# Patient Record
Sex: Male | Born: 1950 | Race: Black or African American | Hispanic: No | Marital: Married | Smoking: Current every day smoker
Health system: Southern US, Community
[De-identification: ages and names within clinical notes are randomized; demographics above are authoritative.]

## PROBLEM LIST (undated history)

## (undated) DIAGNOSIS — M259 Joint disorder, unspecified: Secondary | ICD-10-CM

## (undated) DIAGNOSIS — I10 Essential (primary) hypertension: Secondary | ICD-10-CM

## (undated) HISTORY — DX: Essential (primary) hypertension: I10

## (undated) HISTORY — PX: HERNIA REPAIR: SHX51

---

## 2009-10-07 ENCOUNTER — Emergency Department (HOSPITAL_COMMUNITY): Admission: EM | Admit: 2009-10-07 | Discharge: 2009-10-07 | Payer: Self-pay | Admitting: Emergency Medicine

## 2009-10-07 IMAGING — US US RENAL
1 series · 14 of 25 positions shown · non-contrast
Comparison: None.

CLINICAL DATA: Rectal bleeding, possible hematuria

RENAL/URINARY TRACT ULTRASOUND COMPLETE

[Series 1: us renal · 0.31mm/px · 14 of 37 slices shown]
[im 1/37]
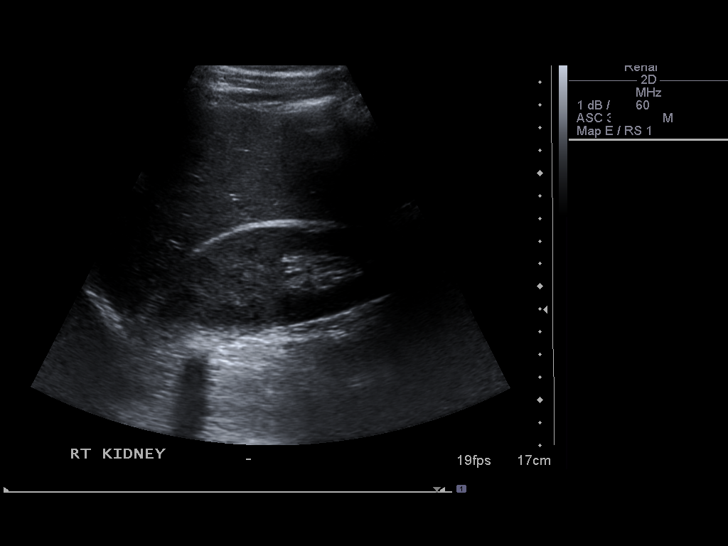
[im 4/37]
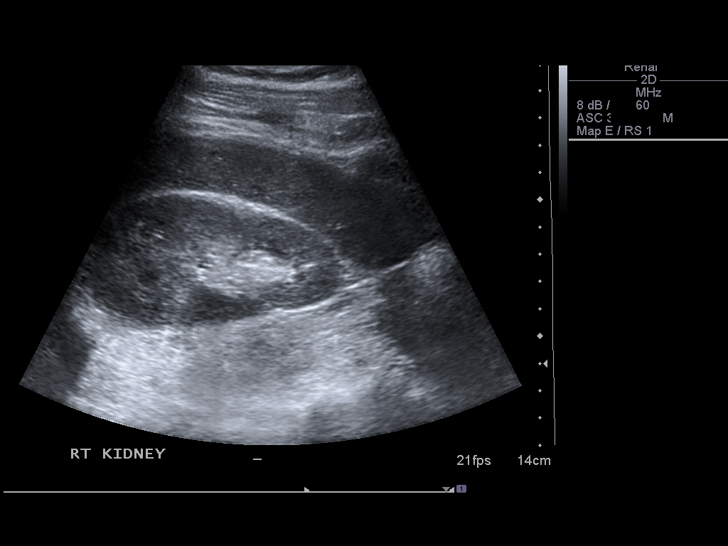
[im 7/37]
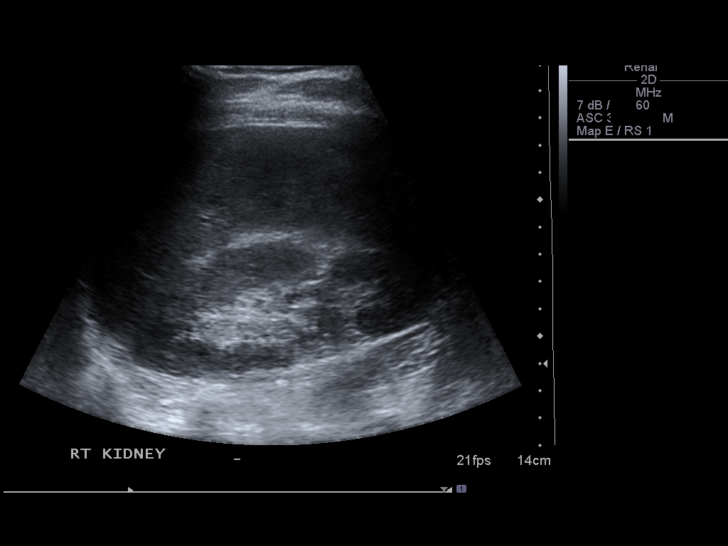
[im 10/37]
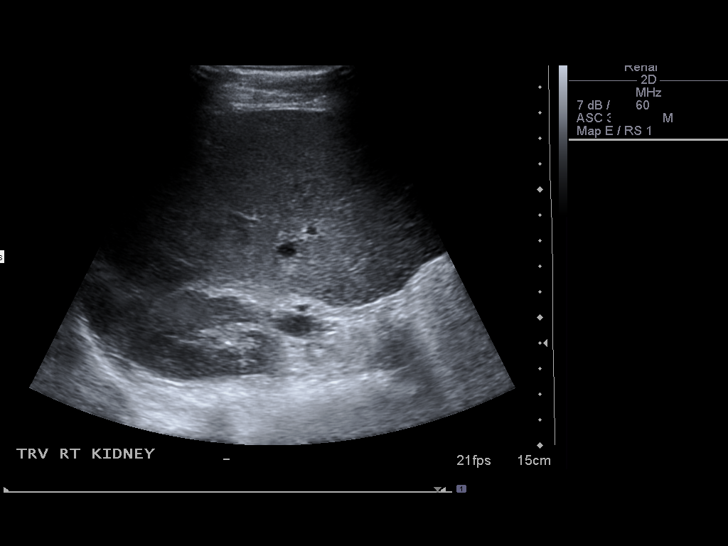
[im 13/37]
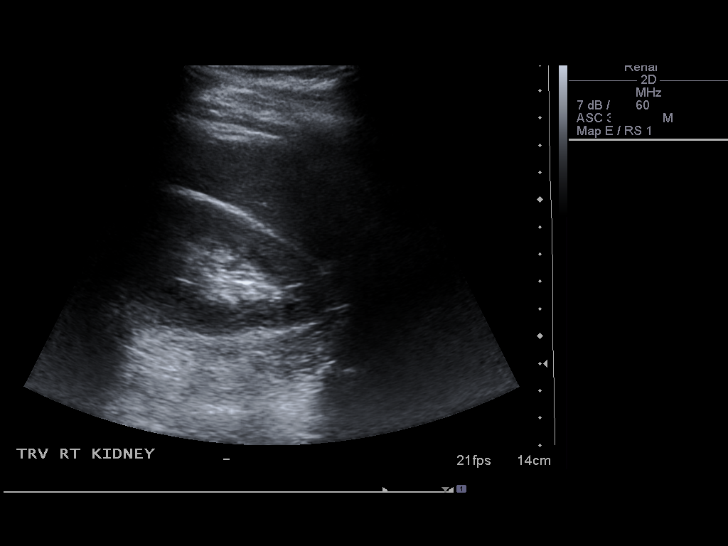
[im 14/37]
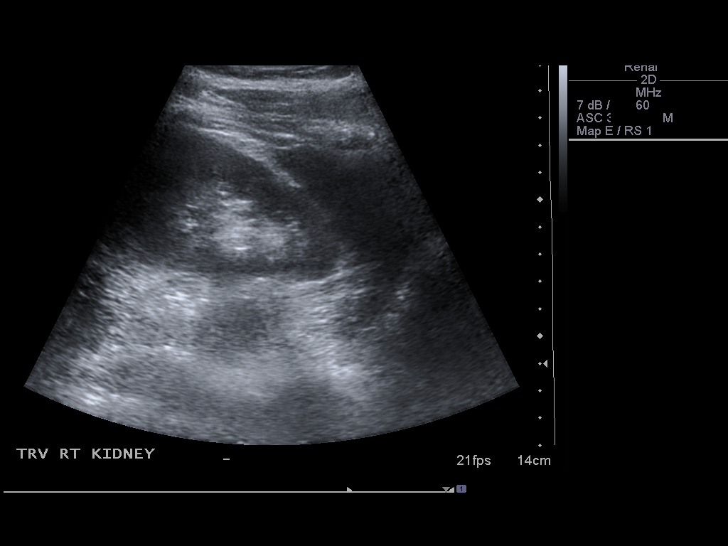
[im 17/37]
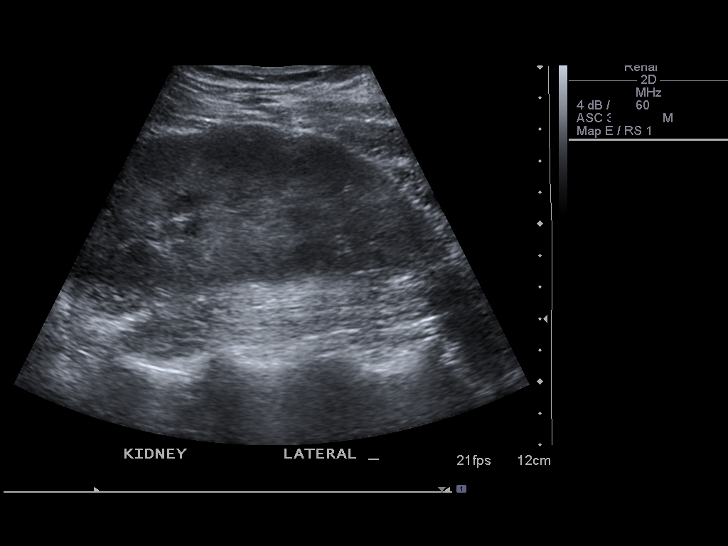
[im 20/37]
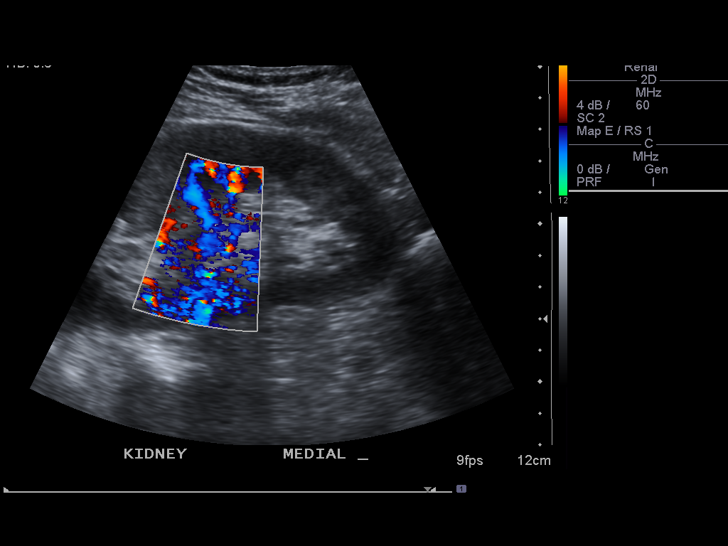
[im 23/37]
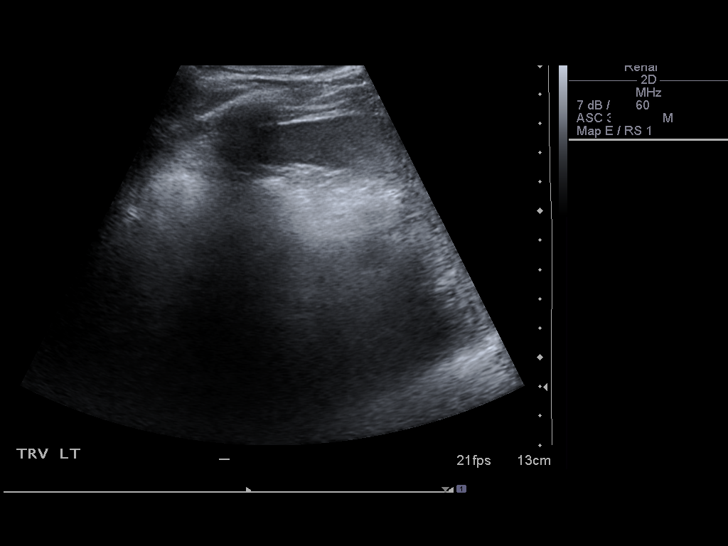
[im 25/37]
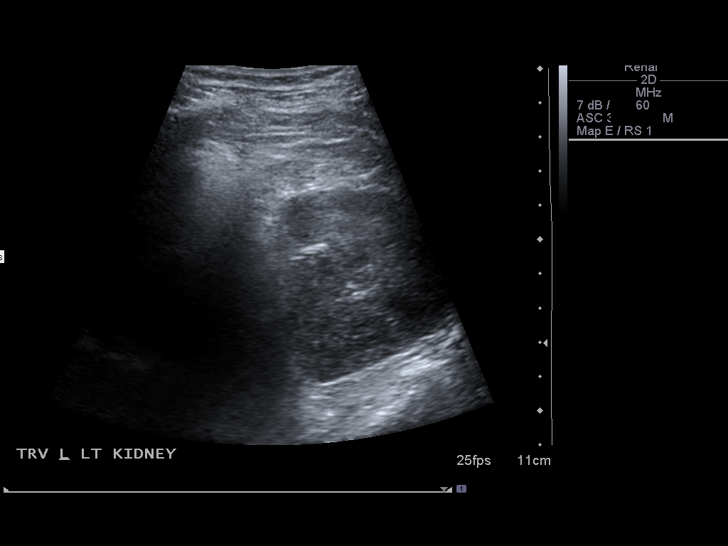
[im 28/37]
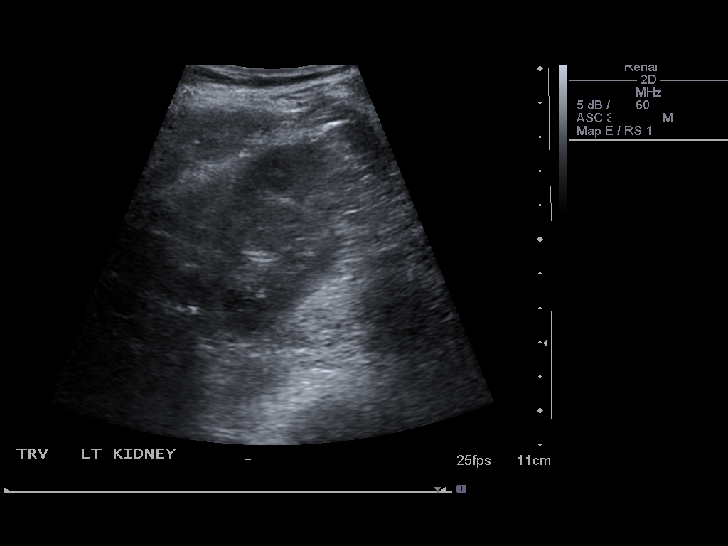
[im 31/37]
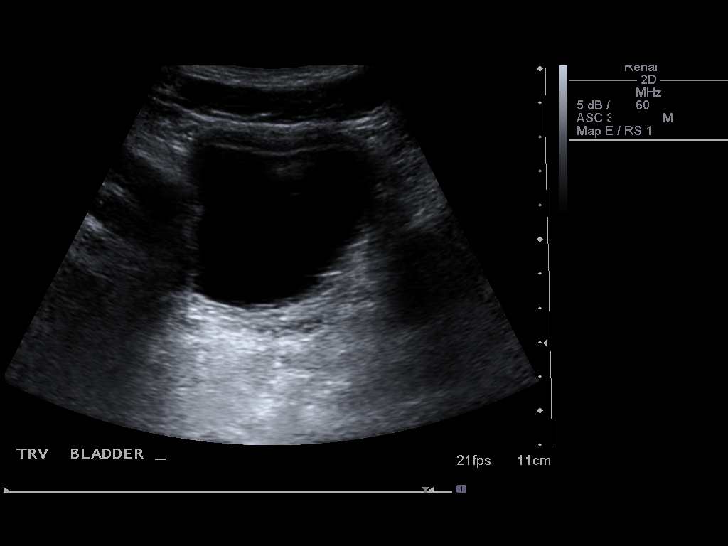
[im 34/37]
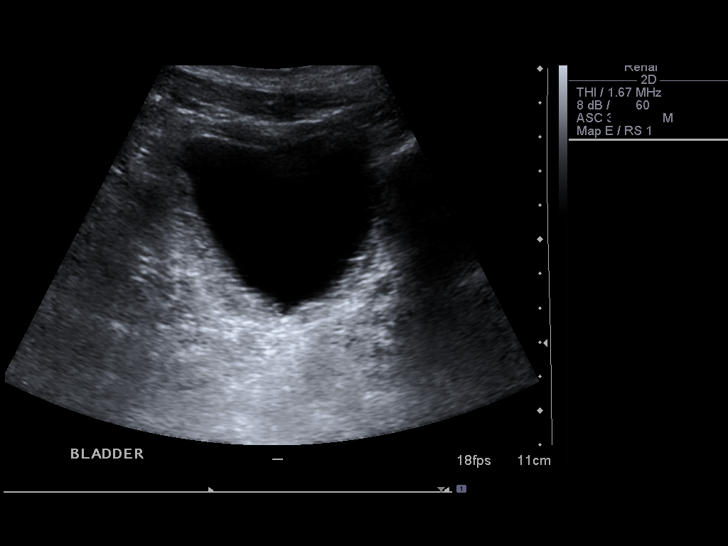
[im 37/37]
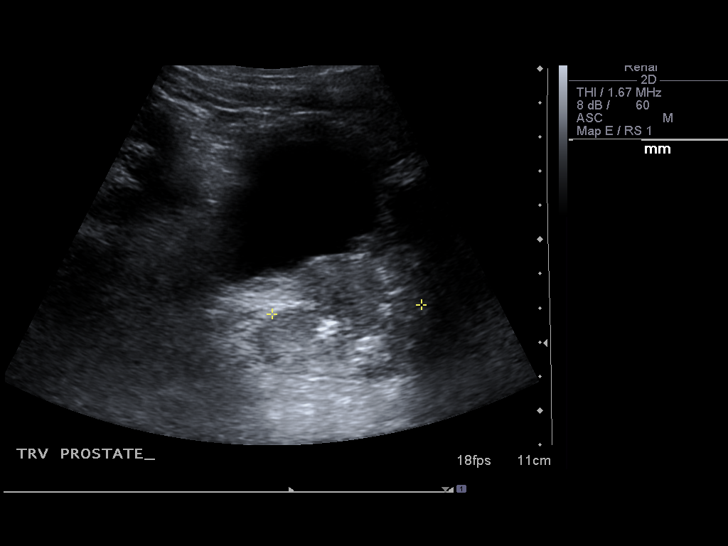

[14 of 25 positions shown; findings below may reference images not displayed]

FINDINGS: Right Kidney:  No hydronephrosis is seen.  The right kidney
measures 11.8 cm sagittally.

Left Kidney:  No hydronephrosis.  The left kidney measures 11.9 cm.

Bladder:  The urinary bladder is not optimally distended.  There is
some debris layering in the bladder.  The prostate is normal in
size.
IMPRESSION: No hydronephrosis.  The bladder is not well distended but is
unremarkable.

## 2009-11-23 ENCOUNTER — Emergency Department (HOSPITAL_COMMUNITY): Admission: EM | Admit: 2009-11-23 | Discharge: 2009-11-23 | Payer: Self-pay | Admitting: Family Medicine

## 2010-03-21 ENCOUNTER — Emergency Department (HOSPITAL_COMMUNITY)
Admission: EM | Admit: 2010-03-21 | Discharge: 2010-03-21 | Payer: Self-pay | Source: Home / Self Care | Admitting: Family Medicine

## 2010-03-21 LAB — POCT URINALYSIS DIPSTICK
Ketones, ur: NEGATIVE mg/dL
Specific Gravity, Urine: 1.015 (ref 1.005–1.030)

## 2010-03-22 LAB — GC/CHLAMYDIA PROBE AMP, GENITAL: GC Probe Amp, Genital: NEGATIVE

## 2010-05-09 LAB — POCT URINALYSIS DIPSTICK
Protein, ur: NEGATIVE mg/dL
Specific Gravity, Urine: 1.02 (ref 1.005–1.030)
Urobilinogen, UA: 0.2 mg/dL (ref 0.0–1.0)
pH: 5.5 (ref 5.0–8.0)

## 2010-05-10 LAB — URINALYSIS, ROUTINE W REFLEX MICROSCOPIC
Bilirubin Urine: NEGATIVE
Glucose, UA: NEGATIVE mg/dL
Ketones, ur: NEGATIVE mg/dL
Leukocytes, UA: NEGATIVE
Nitrite: NEGATIVE
Protein, ur: NEGATIVE mg/dL
Specific Gravity, Urine: 1.01 (ref 1.005–1.030)
Urobilinogen, UA: 0.2 mg/dL (ref 0.0–1.0)
pH: 6 (ref 5.0–8.0)

## 2010-05-10 LAB — BASIC METABOLIC PANEL WITH GFR
CO2: 26 meq/L (ref 19–32)
Calcium: 9 mg/dL (ref 8.4–10.5)
Creatinine, Ser: 0.79 mg/dL (ref 0.4–1.5)
GFR calc non Af Amer: >60 mL/min (ref >60)
Glucose, Bld: 103 mg/dL — ABNORMAL HIGH (ref 70–99)
Sodium: 135 meq/L (ref 135–145)

## 2010-05-10 LAB — CBC
HCT: 44.3 % (ref 39.0–52.0)
Hemoglobin: 15.6 g/dL (ref 13.0–17.0)
MCH: 31.6 pg (ref 26.0–34.0)
MCHC: 35.2 g/dL (ref 30.0–36.0)
MCV: 89.9 fL (ref 78.0–100.0)
Platelets: 232 K/uL (ref 150–400)
RBC: 4.93 MIL/uL (ref 4.22–5.81)
RDW: 13.7 % (ref 11.5–15.5)
WBC: 5.3 10*3/uL (ref 4.0–10.5)

## 2010-05-10 LAB — HEMOCCULT GUIAC POC 1CARD (OFFICE): Fecal Occult Bld: NEGATIVE

## 2010-05-10 LAB — BASIC METABOLIC PANEL
BUN: 14 mg/dL (ref 6–23)
Chloride: 102 mEq/L (ref 96–112)
GFR calc Af Amer: >60 mL/min (ref >60)
Potassium: 4.3 mEq/L (ref 3.5–5.1)

## 2010-05-10 LAB — URINE MICROSCOPIC-ADD ON

## 2010-06-03 ENCOUNTER — Emergency Department (HOSPITAL_COMMUNITY): Payer: Self-pay

## 2010-06-03 ENCOUNTER — Emergency Department (HOSPITAL_COMMUNITY)
Admission: EM | Admit: 2010-06-03 | Discharge: 2010-06-03 | Disposition: A | Discharge status: Home / Self Care | Payer: Self-pay | Attending: Emergency Medicine | Admitting: Emergency Medicine

## 2010-06-03 DIAGNOSIS — S139XXA Sprain of joints and ligaments of unspecified parts of neck, initial encounter: Secondary | ICD-10-CM | POA: Insufficient documentation

## 2010-06-03 DIAGNOSIS — M47812 Spondylosis without myelopathy or radiculopathy, cervical region: Secondary | ICD-10-CM | POA: Insufficient documentation

## 2010-06-03 DIAGNOSIS — S43109A Unspecified dislocation of unspecified acromioclavicular joint, initial encounter: Secondary | ICD-10-CM | POA: Insufficient documentation

## 2010-06-03 DIAGNOSIS — R51 Headache: Secondary | ICD-10-CM | POA: Insufficient documentation

## 2010-06-03 DIAGNOSIS — Y92009 Unspecified place in unspecified non-institutional (private) residence as the place of occurrence of the external cause: Secondary | ICD-10-CM | POA: Insufficient documentation

## 2010-06-03 IMAGING — CR DG SHOULDER 2+V*L*
3 series · 3 of 3 positions shown · non-contrast
Comparison: None.

CLINICAL DATA: Fall

LEFT SHOULDER - 2+ VIEW

[w shoulder ap internal left]
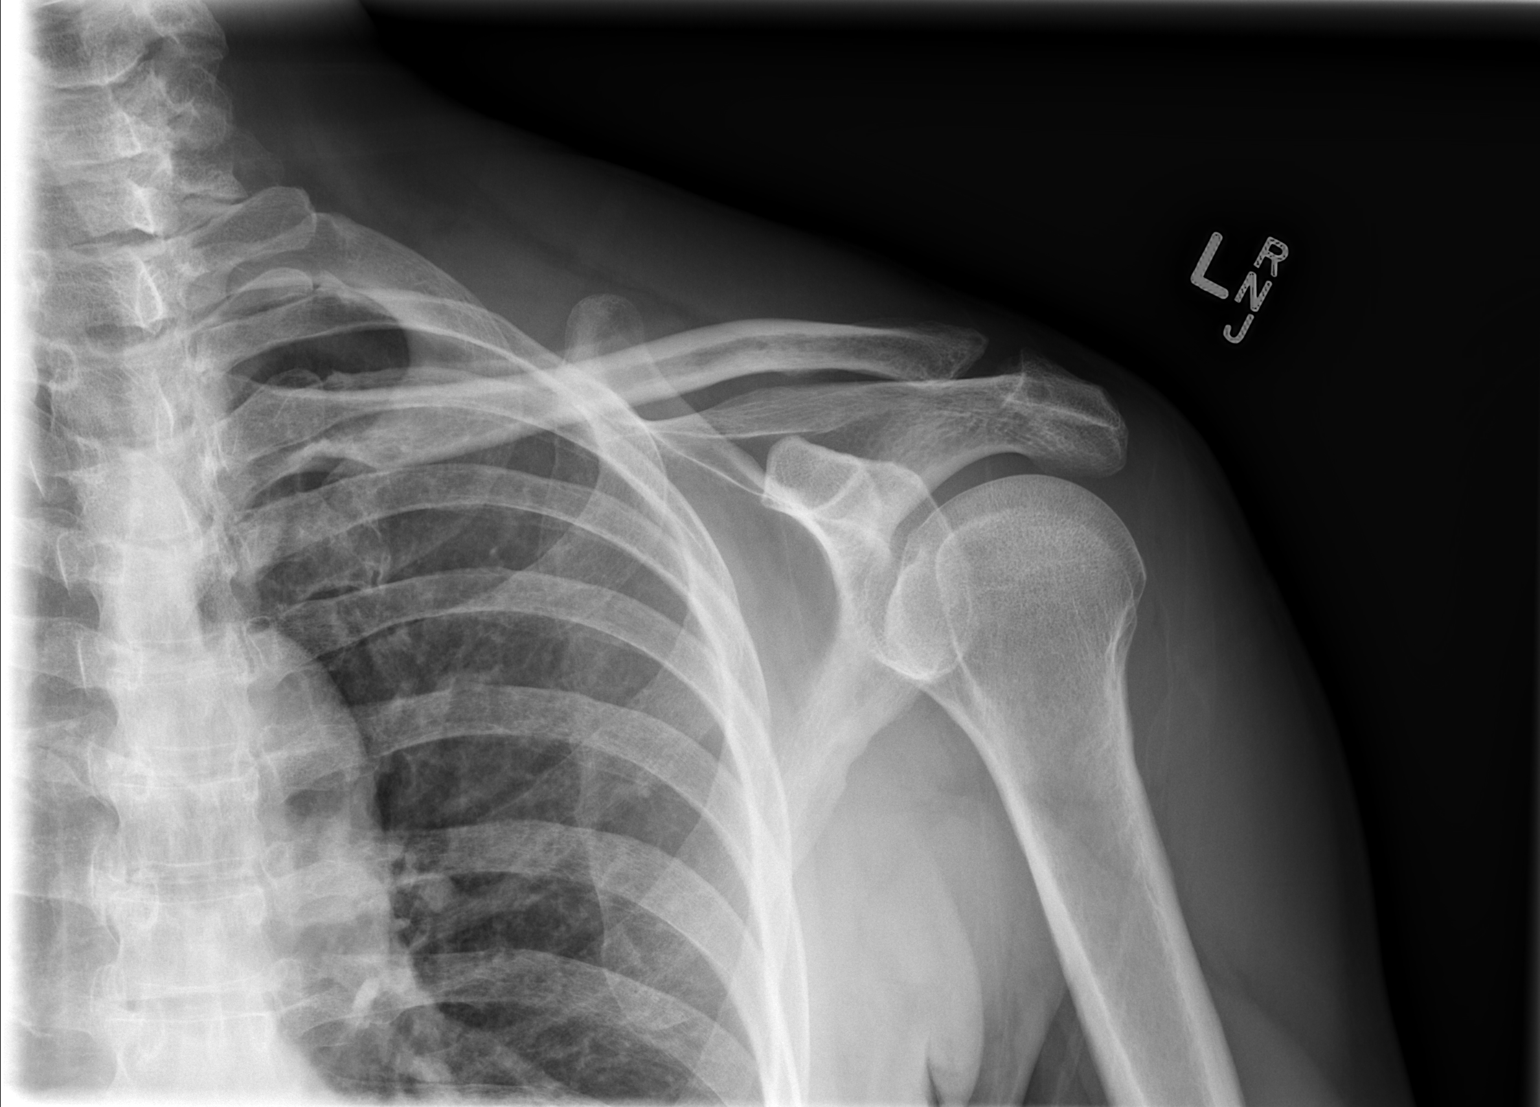

[w shoulder ap external left]
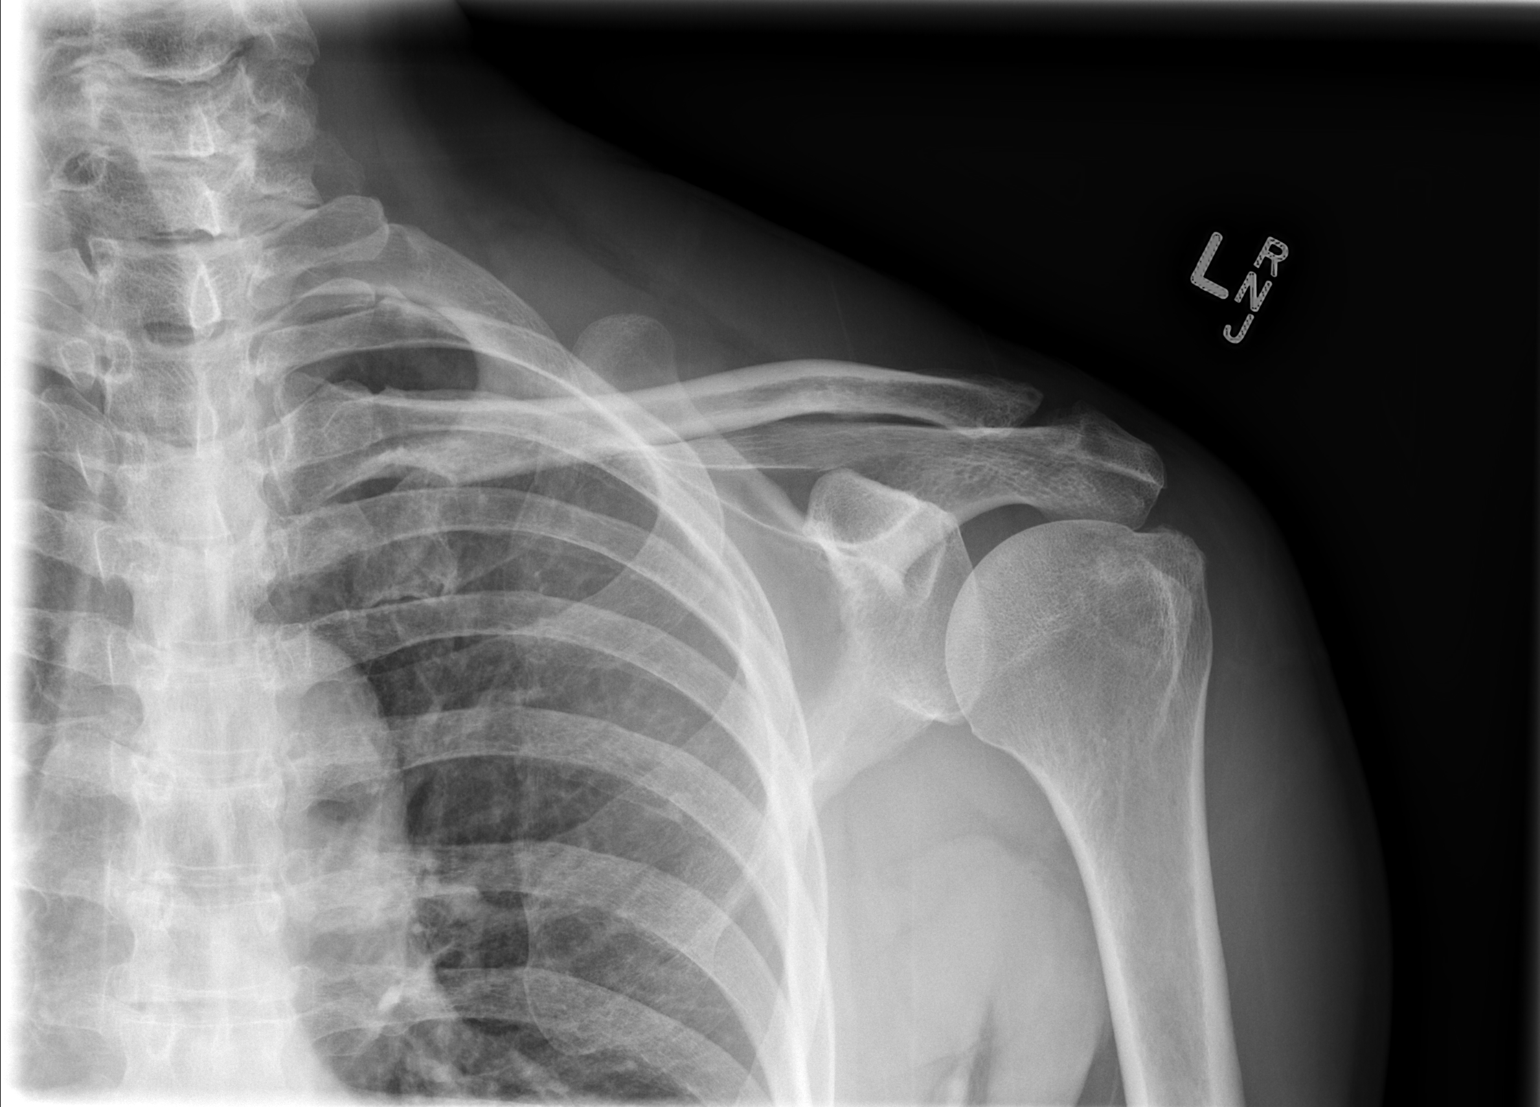

[w shoulder y view left]
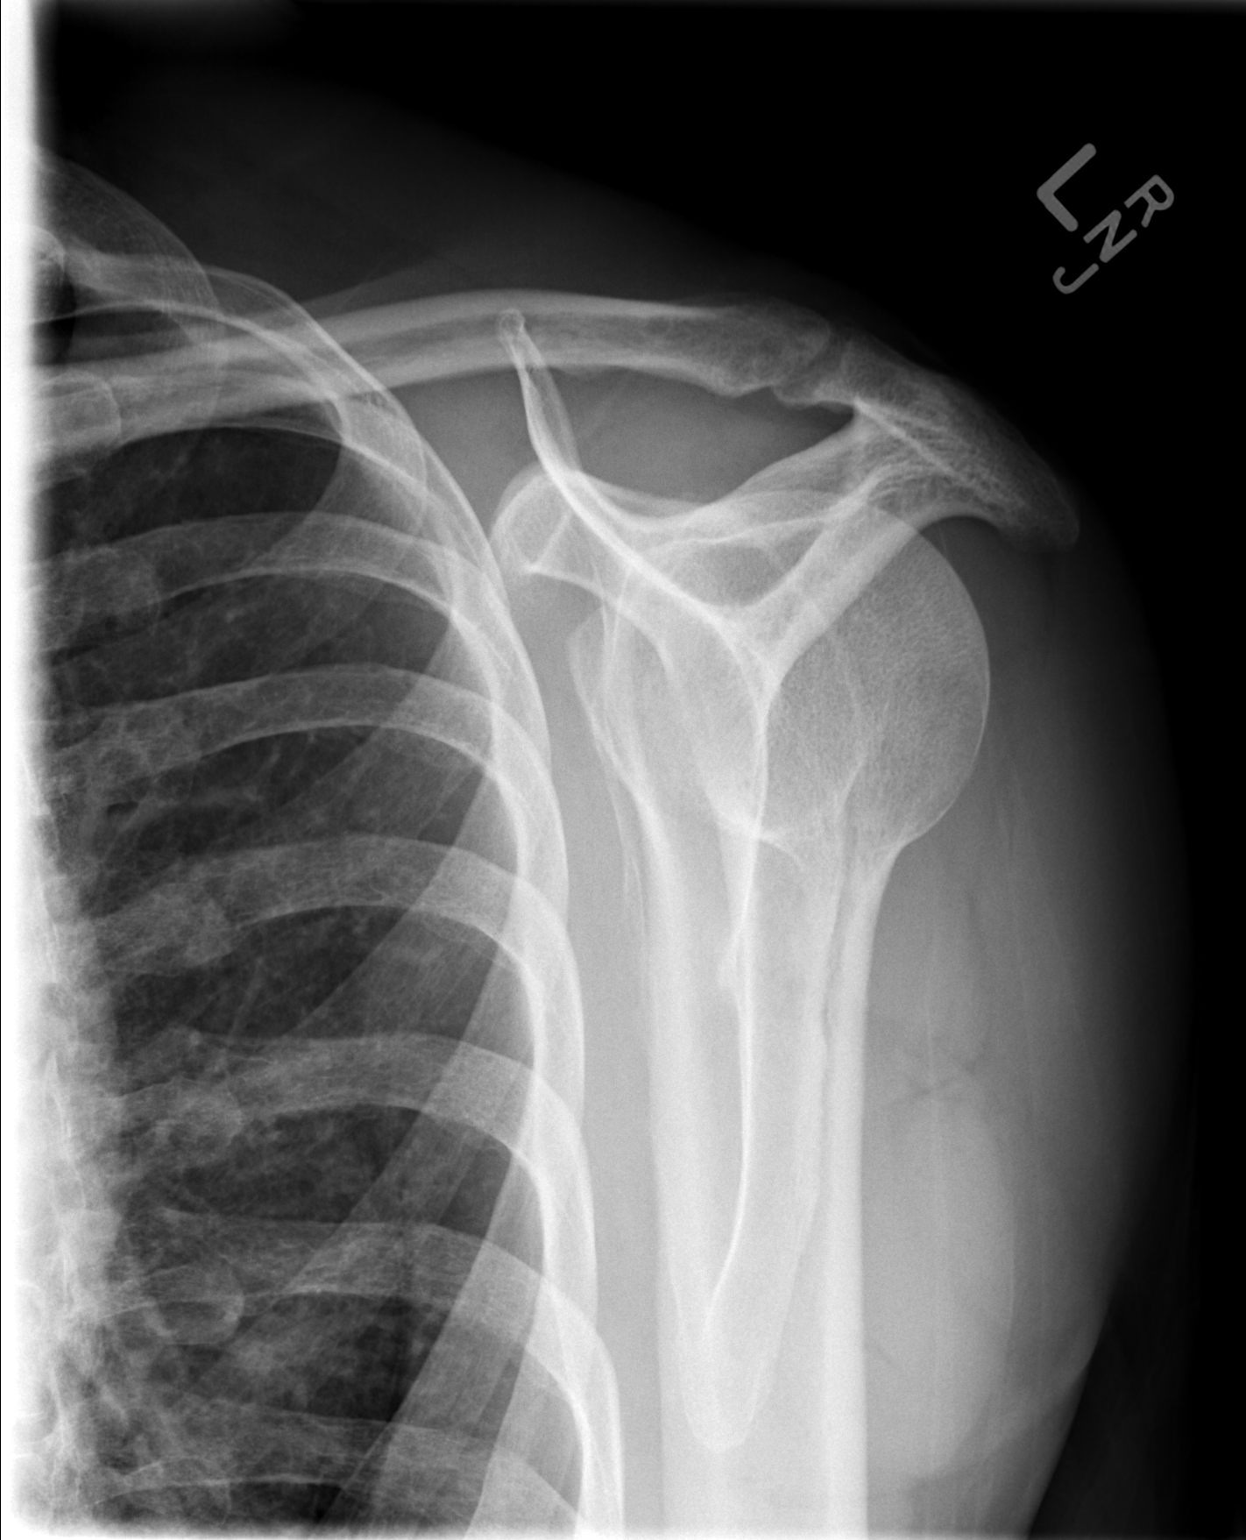

[3 of 3 positions shown; findings below may reference images not displayed]

FINDINGS: The AC joint is widened.  Core toe clavicular interval is
within normal limits.  No fracture.  No evidence of glenohumeral
dislocation.
IMPRESSION: AC joint widening suggesting AC joint tear.  Correlate with
location of point tenderness.
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
## 2010-08-18 ENCOUNTER — Emergency Department (HOSPITAL_COMMUNITY)
Admission: EM | Admit: 2010-08-18 | Discharge: 2010-08-18 | Disposition: A | Discharge status: Home / Self Care | Payer: Self-pay | Attending: Emergency Medicine | Admitting: Emergency Medicine

## 2010-08-18 DIAGNOSIS — M542 Cervicalgia: Secondary | ICD-10-CM | POA: Insufficient documentation

## 2010-09-20 ENCOUNTER — Emergency Department (HOSPITAL_COMMUNITY)
Admission: EM | Admit: 2010-09-20 | Discharge: 2010-09-20 | Disposition: A | Discharge status: Home / Self Care | Payer: Self-pay | Attending: Emergency Medicine | Admitting: Emergency Medicine

## 2010-09-20 DIAGNOSIS — R1310 Dysphagia, unspecified: Secondary | ICD-10-CM | POA: Insufficient documentation

## 2010-09-20 DIAGNOSIS — R6889 Other general symptoms and signs: Secondary | ICD-10-CM | POA: Insufficient documentation

## 2010-12-05 ENCOUNTER — Emergency Department (HOSPITAL_COMMUNITY)
Admission: EM | Admit: 2010-12-05 | Discharge: 2010-12-05 | Disposition: A | Discharge status: Home / Self Care | Payer: Self-pay | Attending: Emergency Medicine | Admitting: Emergency Medicine

## 2010-12-05 DIAGNOSIS — X58XXXA Exposure to other specified factors, initial encounter: Secondary | ICD-10-CM | POA: Insufficient documentation

## 2010-12-05 DIAGNOSIS — M545 Low back pain, unspecified: Secondary | ICD-10-CM | POA: Insufficient documentation

## 2010-12-05 DIAGNOSIS — S025XXA Fracture of tooth (traumatic), initial encounter for closed fracture: Secondary | ICD-10-CM | POA: Insufficient documentation

## 2011-05-01 ENCOUNTER — Encounter (HOSPITAL_COMMUNITY): Payer: Self-pay | Admitting: *Deleted

## 2011-05-01 ENCOUNTER — Emergency Department (HOSPITAL_COMMUNITY): Payer: Self-pay

## 2011-05-01 ENCOUNTER — Emergency Department (HOSPITAL_COMMUNITY)
Admission: EM | Admit: 2011-05-01 | Discharge: 2011-05-01 | Disposition: A | Discharge status: Home / Self Care | Payer: Self-pay | Attending: Emergency Medicine | Admitting: Emergency Medicine

## 2011-05-01 DIAGNOSIS — S61419A Laceration without foreign body of unspecified hand, initial encounter: Secondary | ICD-10-CM

## 2011-05-01 DIAGNOSIS — W298XXA Contact with other powered powered hand tools and household machinery, initial encounter: Secondary | ICD-10-CM | POA: Insufficient documentation

## 2011-05-01 DIAGNOSIS — S61409A Unspecified open wound of unspecified hand, initial encounter: Secondary | ICD-10-CM | POA: Insufficient documentation

## 2011-05-01 DIAGNOSIS — Y9269 Other specified industrial and construction area as the place of occurrence of the external cause: Secondary | ICD-10-CM | POA: Insufficient documentation

## 2011-05-01 DIAGNOSIS — F17200 Nicotine dependence, unspecified, uncomplicated: Secondary | ICD-10-CM | POA: Insufficient documentation

## 2011-05-01 IMAGING — CR DG HAND COMPLETE 3+V*L*
3 series · 3 of 3 positions shown · non-contrast
Comparison: None.

CLINICAL DATA: Second metacarpal pain following injury at work.

LEFT HAND - COMPLETE 3+ VIEW

[x hand pa left]
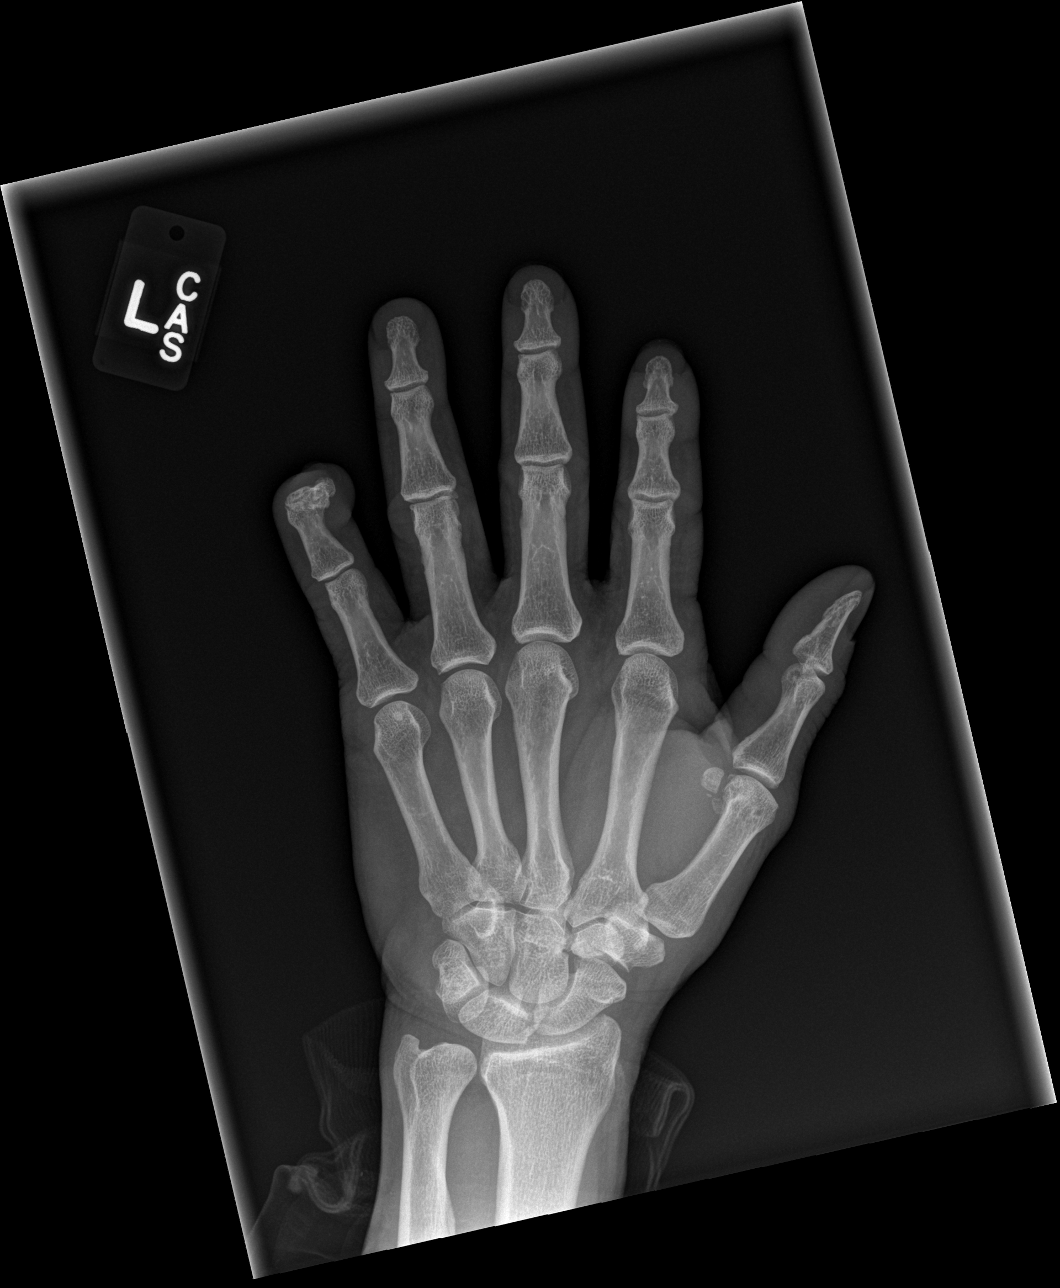

[x hand obl left]
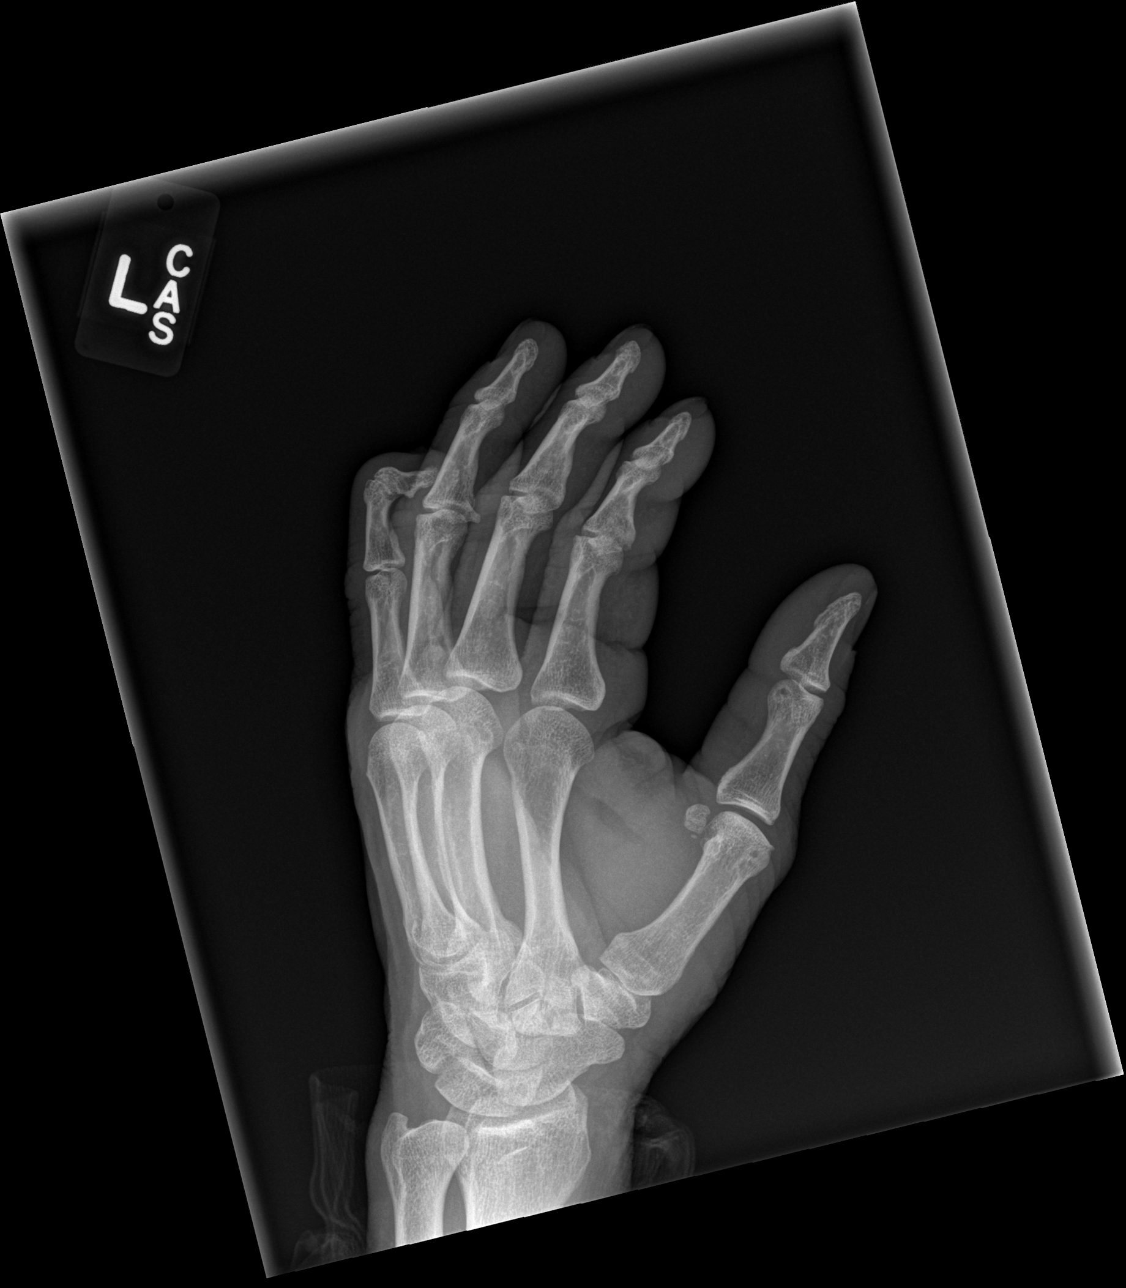

[x hand lat left]
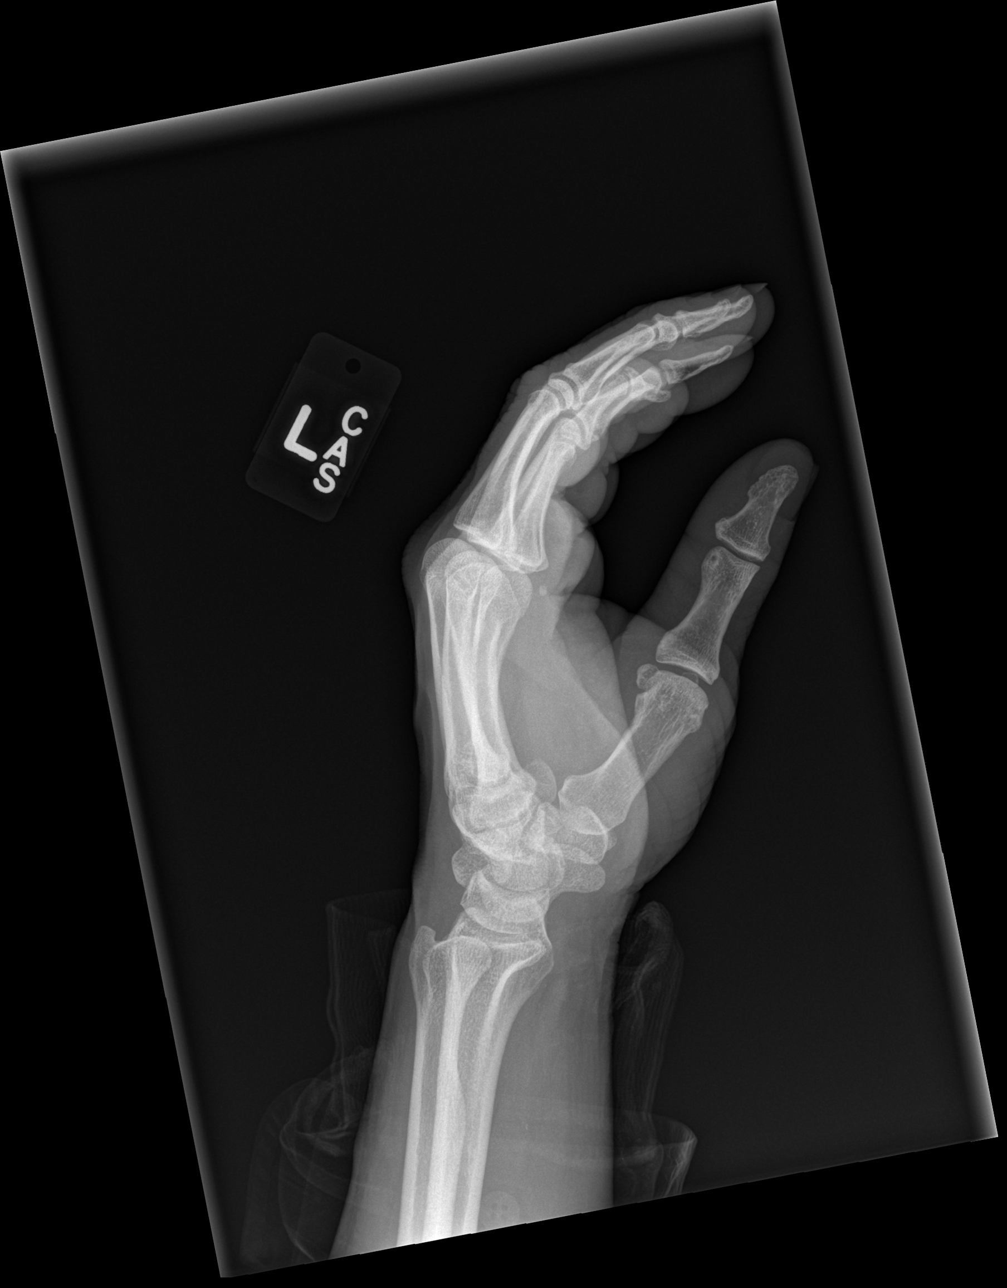

[3 of 3 positions shown; findings below may reference images not displayed]

FINDINGS: There is no evidence of acute fracture or dislocation.
No foreign bodies are identified.  There is an apparent flexion
contracture at the distal interphalangeal joint of the small
finger, presumably chronic.  The alignment is otherwise normal.
IMPRESSION: No acute osseous findings are seen in the area of concern.
Presumed chronic flexion contracture of the distal interphalangeal
joint of the small finger.

## 2011-05-01 MED ORDER — HYDROCODONE-ACETAMINOPHEN 5-325 MG PO TABS: 1.0000 | ORAL_TABLET | ORAL | Status: AC | PRN

## 2011-05-01 NOTE — ED Provider Notes (Signed)
History     CSN: [id number]  Arrival date & time 05/01/11  9147   First MD Initiated Contact with Patient 05/01/11 2151      Chief Complaint  Patient presents with  . Hand Pain    (Consider location/radiation/quality/duration/timing/severity/associated sxs/prior treatment) Patient is a 61 y.o. male presenting with hand injury. The history is provided by the patient.  Hand Injury  The incident occurred 3 to 5 hours ago. The incident occurred at work. Injury mechanism: laceration. The pain is present in the left hand. The quality of the pain is described as sharp. The pain is moderate. The pain has been improving since the incident. He reports no foreign bodies present. The symptoms are aggravated by use and palpation. He has tried nothing for the symptoms.   Pt states he was at work around 6p today and using an angle grinder when he accidentally cut himself with the blade. He suffered a laceration to the dorsum of his L hand. States the blade was new and he is up to date on tetanus (within past 5 yrs). Denies any numbness, weakness, tingling in the hand; he has not noted any decrease in ROM at wrist or in hand.  History reviewed. No pertinent past medical history.  Past Surgical History  Procedure Date  . Hernia repair     No family history on file.  History  Substance Use Topics  . Smoking status: Current Everyday Smoker  . Smokeless tobacco: Not on file  . Alcohol Use: Yes     quart a day of beer      Review of Systems  Constitutional: Negative.   Gastrointestinal: Negative for nausea and vomiting.  Musculoskeletal: Negative for myalgias, joint swelling and arthralgias.  Skin: Positive for wound.  Neurological: Negative for dizziness, weakness and numbness.    Allergies  Aspirin and Ibuprofen  Home Medications  No current outpatient prescriptions on file.  BP 141/86  Pulse 69  Temp(Src) 98 F (36.7 C) (Oral)  Resp 18  SpO2 95%  Physical Exam  Nursing note  and vitals reviewed. Constitutional: He appears well-developed and well-nourished. No distress.  HENT:  Head: Normocephalic and atraumatic.  Neck: Normal range of motion.  Cardiovascular: Normal rate.   Pulmonary/Chest: Effort normal.  Musculoskeletal:       Left wrist: He exhibits normal range of motion.       Left hand: He exhibits laceration. He exhibits normal range of motion, no bony tenderness, normal capillary refill and no swelling. normal sensation noted. Decreased sensation is not present in the ulnar distribution, is not present in the medial redistribution and is not present in the radial distribution. Normal strength noted. He exhibits no finger abduction, no thumb/finger opposition and no wrist extension trouble.       Hands:      ~1.5-2cm laceration to dorsum as diagrammed. Laceration appears superficial; no tendons or other structures visualized. Wound clean. Hand neurovascularly intact with sensory intact to lt touch in radial, median, ulnar dist. Good radial pulse. Cap refill <3.  Neurological: He is alert.  Skin: He is not diaphoretic.    ED Course  Procedures (including critical care time)  LACERATION REPAIR Performed by: Grant Fontana Authorized by: Grant Fontana Consent: Verbal consent obtained. Risks and benefits: risks, benefits and alternatives were discussed Consent given by: patient Patient identity confirmed: provided demographic data Prepped and Draped in normal sterile fashion Wound explored  Laceration Location: L hand dorsum  Laceration Length: 2cm  No Foreign Bodies  seen or palpated  Anesthesia: local infiltration  Local anesthetic: lidocaine 1% without epinephrine  Anesthetic total: 3 ml  Irrigation method: syringe Amount of cleaning: standard  Skin closure: 5-0 Prolene  Number of sutures: 7  Technique: simple interrupted  Patient tolerance: Patient tolerated the procedure well with no immediate complications.   Labs  Reviewed - No data to display Dg Hand Complete Left  05/01/2011  *RADIOLOGY REPORT*  Clinical Data: Second metacarpal pain following injury at work.  LEFT HAND - COMPLETE 3+ VIEW  Comparison: None.  Findings: There is no evidence of acute fracture or dislocation. No foreign bodies are identified.  There is an apparent flexion contracture at the distal interphalangeal joint of the small finger, presumably chronic.  The alignment is otherwise normal.  IMPRESSION: No acute osseous findings are seen in the area of concern. 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  Original Report Authenticated By: Gerrianne Scale, M.D.   I personally reviewed the plain films.  1. Laceration of hand without complication, excluding fingers       MDM  Pt with laceration to dorsum of L hand sustained on a grinder while at work. Tetanus up to date. Negative XR. FROM in all fingers, neurovasc intact. Wound appears clean without tendon involvement. It was irrigated extensively and closed with Prolene which he tolerated well. Return precautions and return for suture removal discussed.        Grant Fontana, [place] 05/02/11 [phone]

## 2011-05-01 NOTE — ED Notes (Signed)
Pt reports he caught left hand in equipment at work around [employer]. Pt reports pain to dorsal hand.

## 2011-05-01 NOTE — Discharge Instructions (Signed)
You'll need to return to urgent care in 7 days to get your stitches removed. If you're noticing increased drainage from the area, redness of the surrounding skin, fever/chills, or other worrisome symptoms, return to the ER.  RESOURCE GUIDE  Dental Problems  Patients with Medicaid: [facility] [phone] [address]                                           [phone] [address] Phone:  [phone]                                                  Phone:  [phone]  If unable to pay or uninsured, contact:  [facility] or [facility]. to become qualified for the adult dental clinic.  Chronic Pain Problems Contact [facility]  [phone] Patients need to be referred by their primary care doctor.  Insufficient Money for Medicine Contact [employer]:  call "211" or [facility] 860-087-2138.  No Primary Care Doctor Call [facility]  [phone] Other agencies that provide inexpensive medical care    [facility]  [phone]    [facility]  [phone]    [facility]  [phone]    [facility]  [phone]    [facility]  [phone]    [facility]  [phone]  [facility]  [phone] [facility] Services  [phone] [facility] Mental Health   [phone] (emergency services [phone])  Substance Abuse Resources Alcohol and Drug Services  [phone] [facility] [phone] [facility] [phone] Floydene Flock [phone] Residential & Outpatient Substance Abuse Program  [phone]  Abuse/Neglect [facility] Child Abuse Hotline 603-247-9427 [facility] Child Abuse Hotline 7127764583 (After Hours)  Emergency Shelter [facility] [phone]  Maternity Homes [facility] [phone] [facility] [phone]  MRSA  Hotline #:   934 572 6347    [facility] Resources  [facility]     [employer]                          [facility] Dept. [address] [place]                       [address]      [address]  [place]                                                Cristobal Goldmann Phone:  [phone]  Phone:  [phone]                 Phone:  [phone]  [facility] Mental Health Phone:  [phone]  [facility] Child Abuse Hotline 919-288-3736 (516)423-2730 (After Hours)  Laceration Care, Adult A laceration is a cut or lesion that goes through all layers of the skin and into the tissue just beneath the skin. TREATMENT  Some lacerations may not require closure. Some lacerations may not be able to be closed due to an increased risk of infection. It is important to see your caregiver as soon as possible after an injury to minimize the risk of infection and maximize the opportunity for successful closure. If closure is appropriate, pain medicines may be given, if needed. The wound will be cleaned to help prevent infection. Your caregiver will use stitches (sutures), staples, wound glue (adhesive), or skin adhesive strips to repair the laceration. These tools bring the skin edges together to allow for faster healing and a better cosmetic outcome. However, all wounds will heal with a scar. Once the wound has healed, scarring can be minimized by covering the wound with sunscreen during the day for 1 full year. HOME CARE INSTRUCTIONS  For sutures or staples:  Keep the wound clean and dry.   If you were given a bandage (dressing), you should change it at least once a day. Also, change the dressing if it becomes wet or dirty, or as directed by your caregiver.   Wash the wound with soap and water 2 times a day. Rinse the wound off with water to remove all soap. Pat the wound dry with a  clean towel.   After cleaning, apply a thin layer of the antibiotic ointment as recommended by your caregiver. This will help prevent infection and keep the dressing from sticking.   You may shower as usual after the first 24 hours. Do not soak the wound in water until the sutures are removed.   Only take over-the-counter or prescription medicines for pain, discomfort, or fever as directed by your caregiver.   Get your sutures or staples removed as directed by your caregiver.  For skin adhesive strips:  Keep the wound clean and dry.   Do not get the skin adhesive strips wet. You may bathe carefully, using caution to keep the wound dry.   If the wound gets wet, pat it dry with a clean towel.   Skin adhesive strips will fall off on their own. You may trim the strips as the wound heals. Do not remove skin adhesive strips that are still stuck to the wound. They will fall off in time.  For wound adhesive:  You may briefly wet your wound in the shower or bath. Do not soak or scrub the wound. Do not swim. Avoid periods of heavy perspiration until the skin adhesive has fallen off on its own. After showering or bathing, gently pat the wound dry with a clean towel.   Do not apply liquid medicine, cream medicine, or ointment medicine to your wound while the skin adhesive is in place. This may loosen the film before your wound is healed.   If a dressing is placed over the wound, be careful not to apply tape directly over the skin adhesive. This may cause the adhesive to be pulled off before the wound is healed.   Avoid prolonged exposure to sunlight or tanning lamps while the skin adhesive is in place. Exposure to ultraviolet light in the first  year will darken the scar.   The skin adhesive will usually remain in place for 5 to 10 days, then naturally fall off the skin. Do not pick at the adhesive film.  You may need a tetanus shot if:  You cannot remember when you had your last tetanus shot.    You have never had a tetanus shot.  If you get a tetanus shot, your arm may swell, get red, and feel warm to the touch. This is common and not a problem. If you need a tetanus shot and you choose not to have one, there is a rare chance of getting tetanus. Sickness from tetanus can be serious. SEEK MEDICAL CARE IF:   You have redness, swelling, or increasing pain in the wound.   You see a red line that goes away from the wound.   You have yellowish-white fluid (pus) coming from the wound.   You have a fever.   You notice a bad smell coming from the wound or dressing.   Your wound breaks open before or after sutures have been removed.   You notice something coming out of the wound such as wood or glass.   Your wound is on your hand or foot and you cannot move a finger or toe.  SEEK IMMEDIATE MEDICAL CARE IF:   Your pain is not controlled with prescribed medicine.   You have severe swelling around the wound causing pain and numbness or a change in color in your arm, hand, leg, or foot.   Your wound splits open and starts bleeding.   You have worsening numbness, weakness, or loss of function of any joint around or beyond the wound.   You develop painful lumps near the wound or on the skin anywhere on your body.  MAKE SURE YOU:   Understand these instructions.   Will watch your condition.   Will get help right away if you are not doing well or get worse.  Document Released: 02/10/2005 Document Revised: 01/30/2011 Document Reviewed: 08/06/2010 [facility] Patient Information 2012 [place], [place].

## 2011-05-01 NOTE — ED Notes (Signed)
Pt reports working with equipment at his job. Pt has laceration to left hand. Bleeding controlled. Pt has 2 inch laceration to dorsal hand between thumb and pointer finger.

## 2011-05-02 NOTE — ED Provider Notes (Signed)
Medical screening examination/treatment/procedure(s) were performed by non-physician practitioner and as supervising physician I was immediately available for consultation/collaboration.  Hurman Horn, MD 05/02/11 2100

## 2011-05-23 ENCOUNTER — Encounter (HOSPITAL_COMMUNITY): Payer: Self-pay | Admitting: Emergency Medicine

## 2011-05-23 ENCOUNTER — Emergency Department (HOSPITAL_COMMUNITY)
Admission: EM | Admit: 2011-05-23 | Discharge: 2011-05-23 | Disposition: A | Discharge status: Home / Self Care | Payer: Self-pay | Attending: Emergency Medicine | Admitting: Emergency Medicine

## 2011-05-23 ENCOUNTER — Emergency Department (HOSPITAL_COMMUNITY): Payer: Self-pay

## 2011-05-23 DIAGNOSIS — M79609 Pain in unspecified limb: Secondary | ICD-10-CM | POA: Insufficient documentation

## 2011-05-23 DIAGNOSIS — L02519 Cutaneous abscess of unspecified hand: Secondary | ICD-10-CM | POA: Insufficient documentation

## 2011-05-23 DIAGNOSIS — L03114 Cellulitis of left upper limb: Secondary | ICD-10-CM

## 2011-05-23 DIAGNOSIS — S6990XA Unspecified injury of unspecified wrist, hand and finger(s), initial encounter: Secondary | ICD-10-CM | POA: Insufficient documentation

## 2011-05-23 DIAGNOSIS — R609 Edema, unspecified: Secondary | ICD-10-CM | POA: Insufficient documentation

## 2011-05-23 DIAGNOSIS — IMO0002 Reserved for concepts with insufficient information to code with codable children: Secondary | ICD-10-CM | POA: Insufficient documentation

## 2011-05-23 IMAGING — CR DG HAND COMPLETE 3+V*L*
3 series · 3 of 3 positions shown · non-contrast
Comparison: [DATE]

CLINICAL DATA: Evaluate for soft tissue foreign body.  Posterior
soft tissue swelling.

LEFT HAND - COMPLETE 3+ VIEW

[x hand pa left]
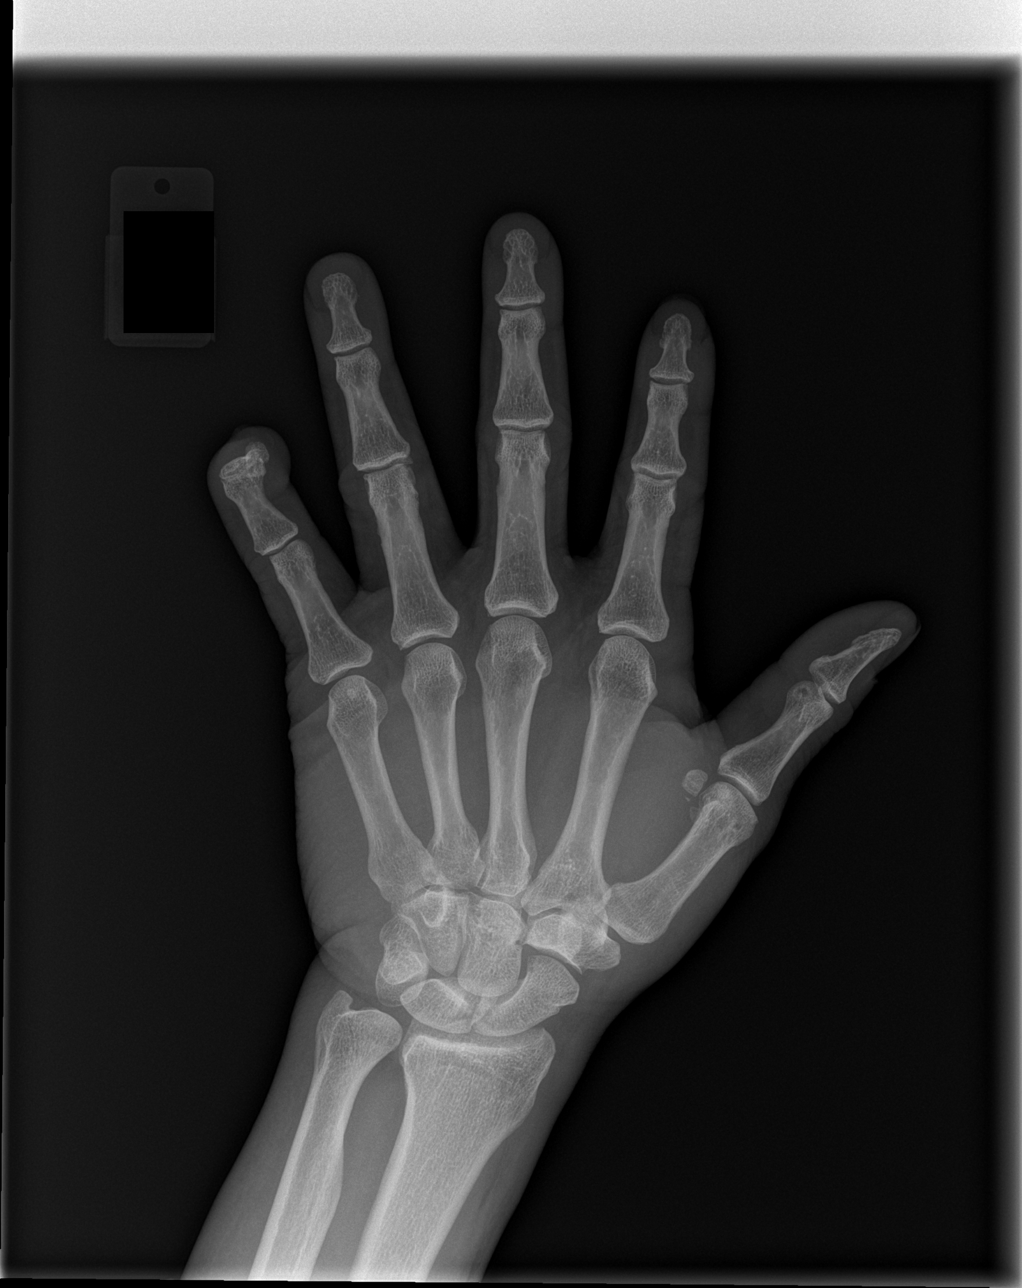

[x hand obl left]
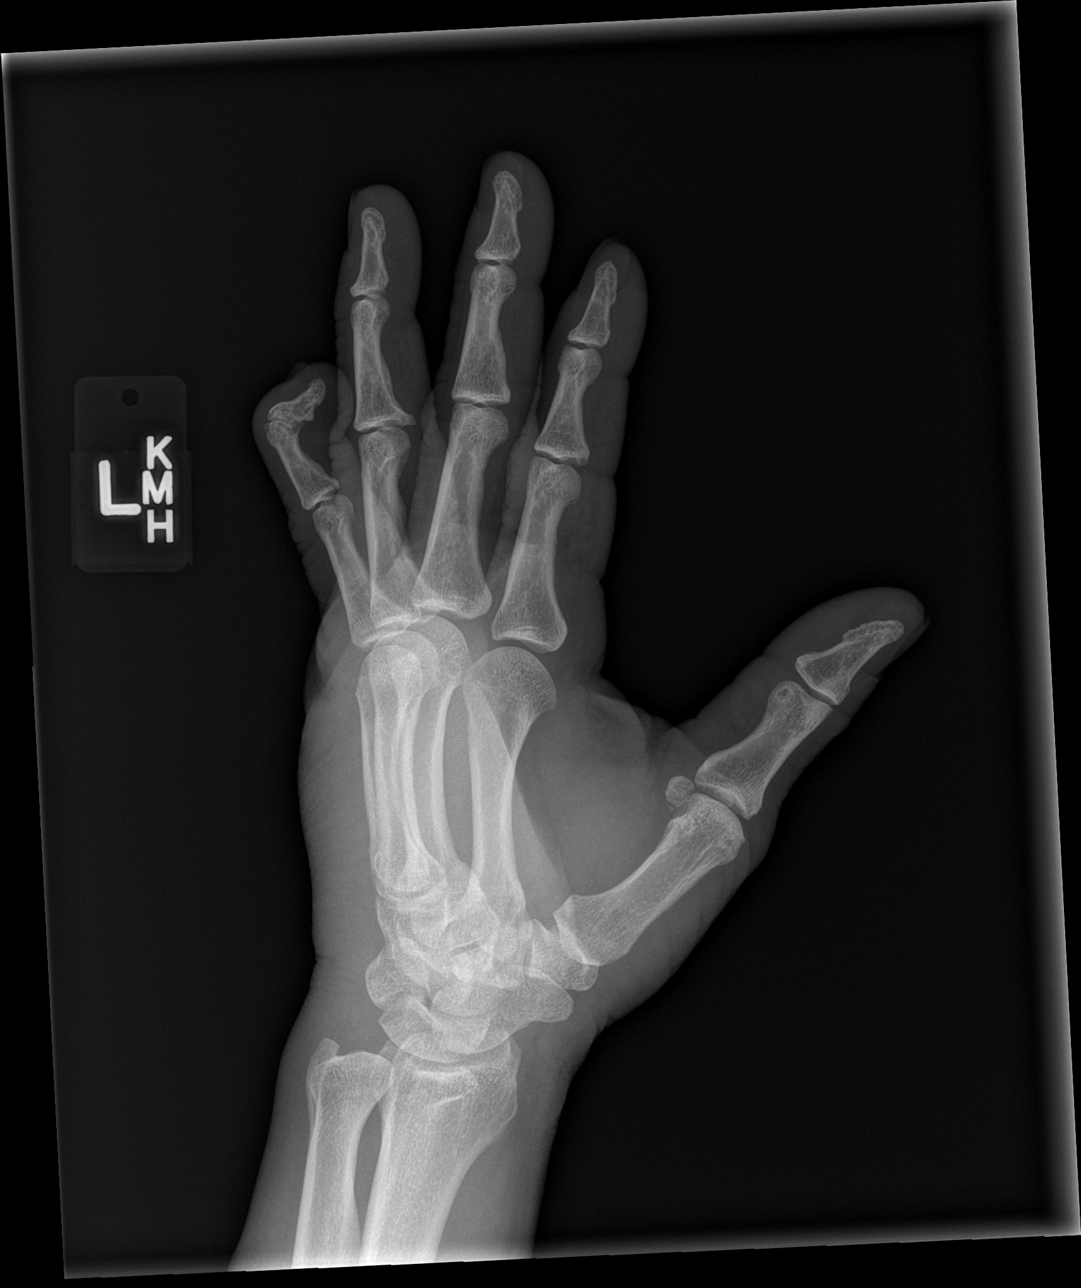

[x hand lat left]
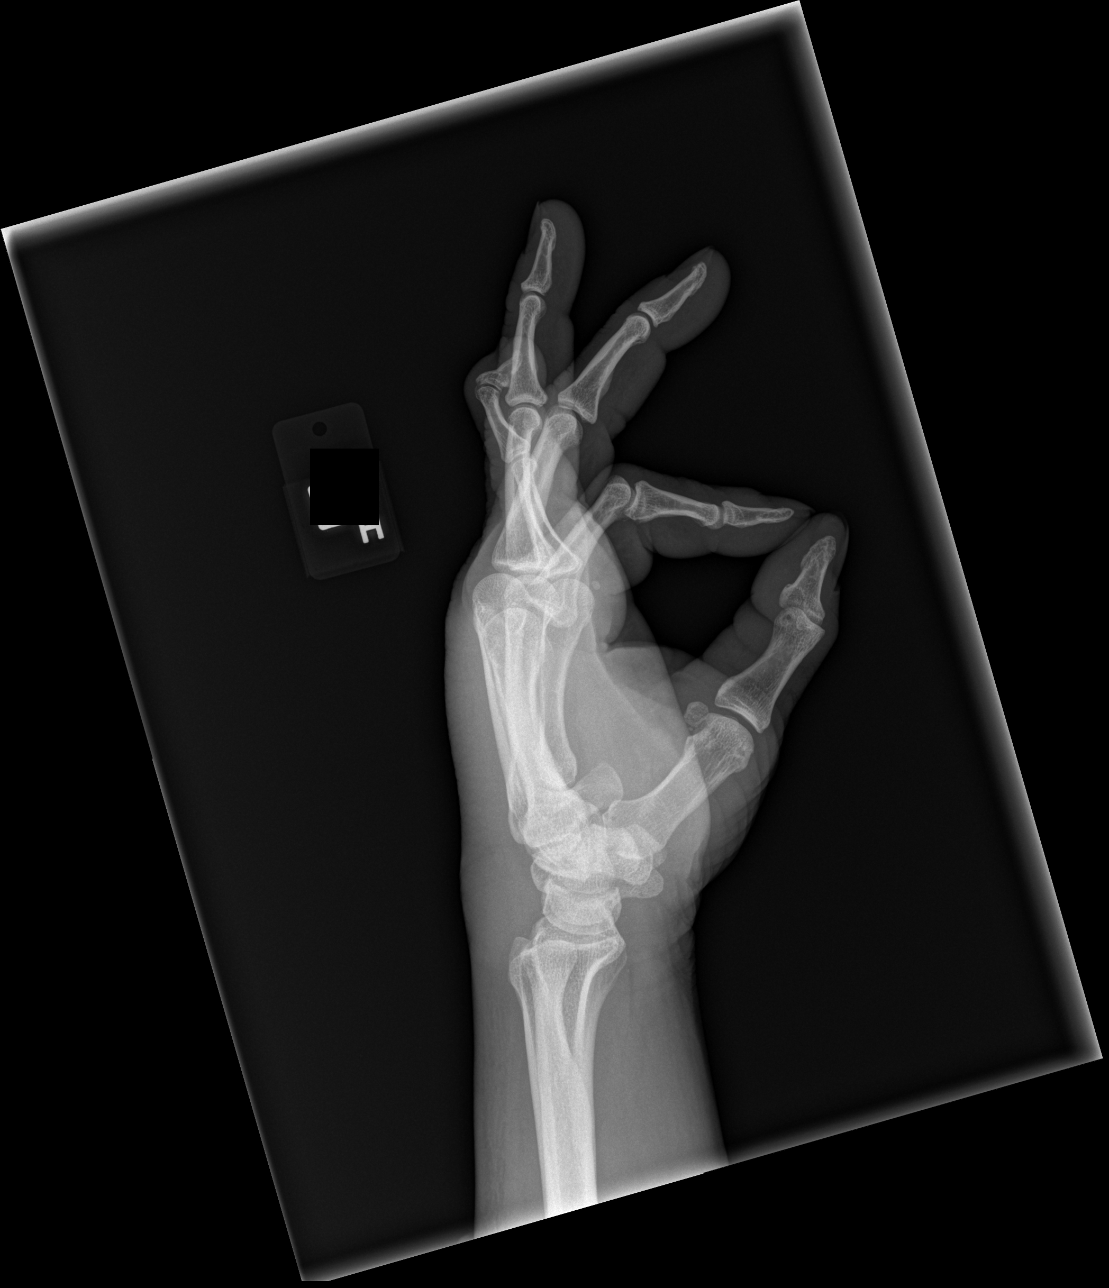

[3 of 3 positions shown; findings below may reference images not displayed]

FINDINGS: Chronic deformity of the left fifth DIP joint.  No acute
bony abnormality.  No acute fracture, subluxation or dislocation.
No radiopaque foreign bodies within the swollen posterior soft
tissues.
IMPRESSION: Posterior soft tissue swelling.  No radiopaque foreign body or
acute bony abnormality.

## 2011-05-23 MED ORDER — CLINDAMYCIN HCL 150 MG PO CAPS: 300.0000 mg | ORAL_CAPSULE | Freq: Two times a day (BID) | ORAL | Status: AC

## 2011-05-23 MED ORDER — MORPHINE SULFATE 4 MG/ML IJ SOLN
4.0000 mg | Freq: Once | INTRAMUSCULAR | Status: AC
  Administered 2011-05-23: 4 mg via INTRAVENOUS
  Filled 2011-05-23: qty 1

## 2011-05-23 MED ORDER — CLINDAMYCIN PHOSPHATE 600 MG/50ML IV SOLN
600.0000 mg | Freq: Once | INTRAVENOUS | Status: AC
  Administered 2011-05-23: 600 mg via INTRAVENOUS
  Filled 2011-05-23: qty 50

## 2011-05-23 MED ORDER — OXYCODONE-ACETAMINOPHEN 5-325 MG PO TABS: 1.0000 | ORAL_TABLET | ORAL | Status: AC | PRN

## 2011-05-23 NOTE — ED Provider Notes (Signed)
History     CSN: [id number]  Arrival date & time 05/23/11  1624   First MD Initiated Contact with Patient 05/23/11 1729      Chief Complaint  Patient presents with  . Hand Problem    (Consider location/radiation/quality/duration/timing/severity/associated sxs/prior treatment) HPI History provided by pt.   Pt was walking through the woods last week and was stuck/scratched in three locations on dorsal surface of left hand w/ a thorny vine.  Developed severe pain w/ associated edema/erythema of left hand the next morning that seems to be worsening. Pain aggravated by hanging hand.  No recent fevers.  Pt has no known PMH.   History reviewed. No pertinent past medical history.  Past Surgical History  Procedure Date  . Hernia repair     History reviewed. No pertinent family history.  History  Substance Use Topics  . Smoking status: Current Everyday Smoker  . Smokeless tobacco: Not on file  . Alcohol Use: Yes     quart a day of beer      Review of Systems  All other systems reviewed and are negative.    Allergies  Aspirin and Ibuprofen  Home Medications  No current outpatient prescriptions on file.  BP 149/94  Pulse 78  Temp(Src) 98.4 F (36.9 C) (Oral)  Resp 18  Wt 165 lb (74.844 kg)  SpO2 100%  Physical Exam  Nursing note and vitals reviewed. Constitutional: He is oriented to person, place, and time. He appears well-developed and well-nourished. No distress.  HENT:  Head: Normocephalic and atraumatic.  Eyes:       Normal appearance  Neck: Normal range of motion.  Musculoskeletal:       Pt holding left elbow in flexion w/ left hand elevated.  Dorsal surface of left hand edematous and w/ diffuse erythema from wrist to MCP joints.  Shallow ulceration draining clear fluid over carpal bones.  Tenderness w/ guarding on dorsal surface only.  Full ROM of wrist and fingers.  Distal sensation intact.   Neurological: He is alert and oriented to person, place, and  time.  Psychiatric: He has a normal mood and affect. His behavior is normal.    ED Course  Procedures (including critical care time)  Labs Reviewed - No data to display No results found.   1. Cellulitis of left hand       MDM  Pt was stuck with a thorn on dorsal surface of left hand 7 days ago and presents today w/ cellulitis.  Afebrile, significant edema, erythema and tenderness to dorsal surface, sparing fingers, on exam.  600mg  IV clinda and 4mg  morphine ordered.  Will obtain xray to r/o foreign body.  Consulted Dr. Izora Ribas who is happy to follow up with patient in office on Monday but recommends that he return to ED sooner if sx worsen.  Information relayed to patient.    Xray neg for foreign body.  Pt had relief of pain w/ IV morphine.  D/c'd home w/ clinda and referral to Dr. Izora Ribas.  Strict return precautions discussed.         Otilio Miu, [place] 05/24/11 [phone]

## 2011-05-23 NOTE — ED Notes (Signed)
Pt states he was stuck on the back of his left hand last Friday with the thorn of a "green vine". Pt states since then, he has had pain and swelling to his hand.

## 2011-05-23 NOTE — Discharge Instructions (Signed)
Take antibiotic as prescribed.  You can contact your pharmacy to check on the price first, but it is on the $4 list at [facility] if you join their prescription program.  Take percocet as needed for severe pain.  Do not drive within four hours of taking this medication (may cause drowsiness or confusion).  Keep hand elevated and ice 2-3 times a day.  Follow up with Dr. Izora Ribas on Monday but return to the ER sooner if you develop fever, worsening pain/swelling or any other concerning symptoms.

## 2011-05-23 NOTE — Progress Notes (Signed)
ED CM noted self pay with no pcp pt.  Spoke with pt and male in triage rm #5.  Confirmed self pay with no pcp and has recently moved to [facility] within the last 3 months.  Cm reviewed [facility], list of self pay [place] pcps, needymeds.com, discount pharmacies and [facility] outpatient pharmacy. Provided a written copy of these resources and other self pay [place] resources.  Pt states he lives on [address] and may see Dr Mikeal Hawthorne, Pt and male voiced understanding and appreciation of services/resources.

## 2011-05-23 NOTE — ED Provider Notes (Signed)
Medical screening examination/treatment/procedure(s) were conducted as a shared visit with non-physician practitioner(s) and myself.  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  No fever. No immunocompromise.  Left hand 4+ selling over dorsum of hand with ttp over lat. Dorsal hand.  + abscess at extensor wrist crease next to ulnar styloid.  No palmar in hand or digits.    Will give iv abxs and discuss with hand surgery.    Cheri Guppy, MD 05/23/11 2322

## 2011-05-23 NOTE — ED Notes (Signed)
Patient transported to X-ray 

## 2011-05-24 NOTE — ED Provider Notes (Signed)
Medical screening examination/treatment/procedure(s) were performed by non-physician practitioner and as supervising physician I was immediately available for consultation/collaboration.  Coolidge Gossard, MD 05/24/11 1906 

## 2011-07-21 ENCOUNTER — Encounter (HOSPITAL_COMMUNITY): Payer: Self-pay | Admitting: *Deleted

## 2011-07-21 ENCOUNTER — Emergency Department (HOSPITAL_COMMUNITY)
Admission: EM | Admit: 2011-07-21 | Discharge: 2011-07-21 | Disposition: A | Discharge status: Home / Self Care | Payer: Self-pay | Attending: Emergency Medicine | Admitting: Emergency Medicine

## 2011-07-21 DIAGNOSIS — M545 Low back pain: Secondary | ICD-10-CM

## 2011-07-21 DIAGNOSIS — F17200 Nicotine dependence, unspecified, uncomplicated: Secondary | ICD-10-CM | POA: Insufficient documentation

## 2011-07-21 DIAGNOSIS — G8929 Other chronic pain: Secondary | ICD-10-CM | POA: Insufficient documentation

## 2011-07-21 DIAGNOSIS — M549 Dorsalgia, unspecified: Secondary | ICD-10-CM | POA: Insufficient documentation

## 2011-07-21 MED ORDER — CYCLOBENZAPRINE HCL 10 MG PO TABS: 10.0000 mg | ORAL_TABLET | Freq: Two times a day (BID) | ORAL | Status: AC | PRN

## 2011-07-21 MED ORDER — HYDROCODONE-ACETAMINOPHEN 5-325 MG PO TABS
1.0000 | ORAL_TABLET | ORAL | Status: AC | PRN
Start: 2011-07-21 — End: 2011-07-31

## 2011-07-21 MED ORDER — HYDROCODONE-ACETAMINOPHEN 5-325 MG PO TABS
1.0000 | ORAL_TABLET | Freq: Once | ORAL | Status: AC
  Administered 2011-07-21: 1 via ORAL
  Filled 2011-07-21: qty 1

## 2011-07-21 MED ORDER — CYCLOBENZAPRINE HCL 10 MG PO TABS
10.0000 mg | ORAL_TABLET | Freq: Once | ORAL | Status: AC
  Administered 2011-07-21: 10 mg via ORAL
  Filled 2011-07-21: qty 1

## 2011-07-21 NOTE — Discharge Instructions (Signed)
FOLLOW UP WITH DR. Luiz Blare FOR RECURRENT BACK PAIN AND REGULAR MANAGEMENT WHEN NEEDED. TAKE MEDICATIONS AS PRESCRIBED. RETURN HERE AS NEEDED.  Back Pain, Adult Back pain is very common. The pain often gets better over time. The cause of back pain is usually not dangerous. Most people can learn to manage their back pain on their own.  HOME CARE   Stay active. Start with short walks on flat ground if you can. Try to walk farther each day.   Do not sit, drive, or stand in one place for more than 30 minutes. Do not stay in bed.   Do not avoid exercise or work. Activity can help your back heal faster.   Be careful when you bend or lift an object. Bend at your knees, keep the object close to you, and do not twist.   Sleep on a firm mattress. Lie on your side, and bend your knees. If you lie on your back, put a pillow under your knees.   Only take medicines as told by your doctor.   Put ice on the injured area.   Put ice in a plastic bag.   Place a towel between your skin and the bag.   Leave the ice on for 15 to 20 minutes, 3 to 4 times a day for the first 2 to 3 days. After that, you can switch between ice and heat packs.   Ask your doctor about back exercises or massage.   Avoid feeling anxious or stressed. Find good ways to deal with stress, such as exercise.  GET HELP RIGHT AWAY IF:   Your pain does not go away with rest or medicine.   Your pain does not go away in 1 week.   You have new problems.   You do not feel well.   The pain spreads into your legs.   You cannot control when you poop (bowel movement) or pee (urinate).   Your arms or legs feel weak or lose feeling (numbness).   You feel sick to your stomach (nauseous) or throw up (vomit).   You have belly (abdominal) pain.   You feel like you may pass out (faint).  MAKE SURE YOU:   Understand these instructions.   Will watch your condition.   Will get help right away if you are not doing well or get worse.    Document Released: 07/30/2007 Document Revised: 01/30/2011 Document Reviewed: 07/01/2010 [facility] Patient Information 2012 [place], [place].

## 2011-07-21 NOTE — ED Provider Notes (Signed)
History     CSN: [id number]  Arrival date & time 07/21/11  1437   First MD Initiated Contact with Patient 07/21/11 1628      Chief Complaint  Patient presents with  . Back Pain    (Consider location/radiation/quality/duration/timing/severity/associated sxs/prior treatment) Patient is a 61 y.o. male presenting with back pain. The history is provided by the patient.  Back Pain  This is a recurrent problem. The current episode started more than 2 days ago. The problem occurs constantly. The problem has been gradually worsening. The pain is associated with lifting heavy objects. Pertinent negatives include no fever and no numbness. Associated symptoms comments: History of back pain similar to presenting pain today. No numbness, urinary or fecal incontinence, weakness.Marland Kitchen    History reviewed. No pertinent past medical history.  Past Surgical History  Procedure Date  . Hernia repair     No family history on file.  History  Substance Use Topics  . Smoking status: Current Everyday Smoker  . Smokeless tobacco: Not on file  . Alcohol Use: Yes     quart a day of beer      Review of Systems  Constitutional: Negative for fever.  HENT: Negative for neck pain.   Genitourinary: Negative.   Musculoskeletal: Positive for back pain.  Neurological: Negative for numbness.    Allergies  Aspirin and Ibuprofen  Home Medications  No current outpatient prescriptions on file.  BP 126/91  Pulse 69  Temp(Src) 98.8 F (37.1 C) (Oral)  Resp 20  SpO2 100%  Physical Exam  Constitutional: He is oriented to person, place, and time. He appears well-developed and well-nourished.  Pulmonary/Chest: Effort normal.  Abdominal: Soft. He exhibits no mass. There is no tenderness.  Musculoskeletal: Normal range of motion. He exhibits no edema.       Right paralumbar tenderness without swelling, discoloration. No sciatic tenderness on right. Distal pulses 2+.  Neurological: He is alert and oriented  to person, place, and time. He has normal reflexes. He displays normal reflexes. No sensory deficit. Coordination normal.  Skin: Skin is warm and dry.  Psychiatric: He has a normal mood and affect.    ED Course  Procedures (including critical care time)  Labs Reviewed - No data to display No results found.   No diagnosis found.  1. Acute on chronic back pain  MDM  Recurrent pain similar to previous after heavy lifting. Will treat as muscular pain and refer to ortho prn.        Rodena Medin, PA-C 07/21/11 1645

## 2011-07-21 NOTE — ED Notes (Signed)
Back pain for 4-5 days, no urinary symptoms, no vomiting or diarrhea, has history of back pain but does not recall any injury

## 2011-07-22 NOTE — ED Provider Notes (Signed)
Medical screening examination/treatment/procedure(s) were performed by non-physician practitioner and as supervising physician I was immediately available for consultation/collaboration.  Terri Malerba T Griff, MD 07/22/11 2234 

## 2012-01-01 ENCOUNTER — Emergency Department (HOSPITAL_COMMUNITY): Payer: Self-pay

## 2012-01-01 ENCOUNTER — Emergency Department (HOSPITAL_COMMUNITY)
Admission: EM | Admit: 2012-01-01 | Discharge: 2012-01-01 | Disposition: A | Discharge status: Home / Self Care | Payer: Self-pay | Attending: Emergency Medicine | Admitting: Emergency Medicine

## 2012-01-01 ENCOUNTER — Encounter (HOSPITAL_COMMUNITY): Payer: Self-pay | Admitting: Emergency Medicine

## 2012-01-01 DIAGNOSIS — F17200 Nicotine dependence, unspecified, uncomplicated: Secondary | ICD-10-CM | POA: Insufficient documentation

## 2012-01-01 DIAGNOSIS — M25519 Pain in unspecified shoulder: Secondary | ICD-10-CM | POA: Insufficient documentation

## 2012-01-01 DIAGNOSIS — M25512 Pain in left shoulder: Secondary | ICD-10-CM

## 2012-01-01 HISTORY — DX: Joint disorder, unspecified: M25.9

## 2012-01-01 IMAGING — CR DG SHOULDER 2+V*L*
1 series · 3 of 3 positions shown · non-contrast
Comparison: [DATE]

CLINICAL DATA: shoulder pain

LEFT SHOULDER - 2+ VIEW

[Series 1: ap int/ext rotation · left · 3 of 3 slices shown]
[im 1/3]
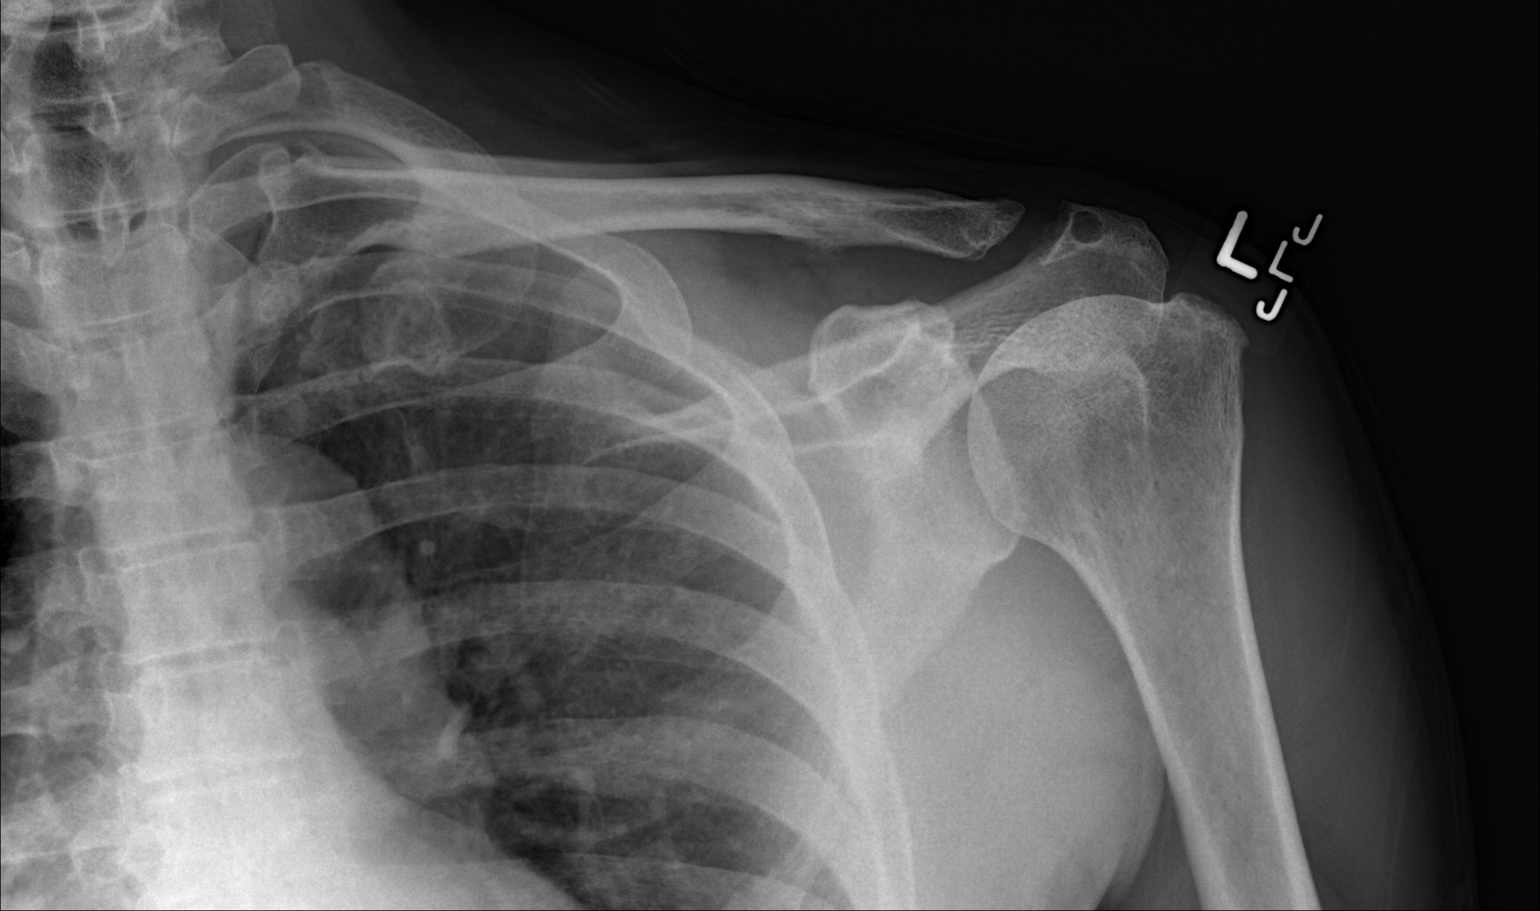
[im 2/3]
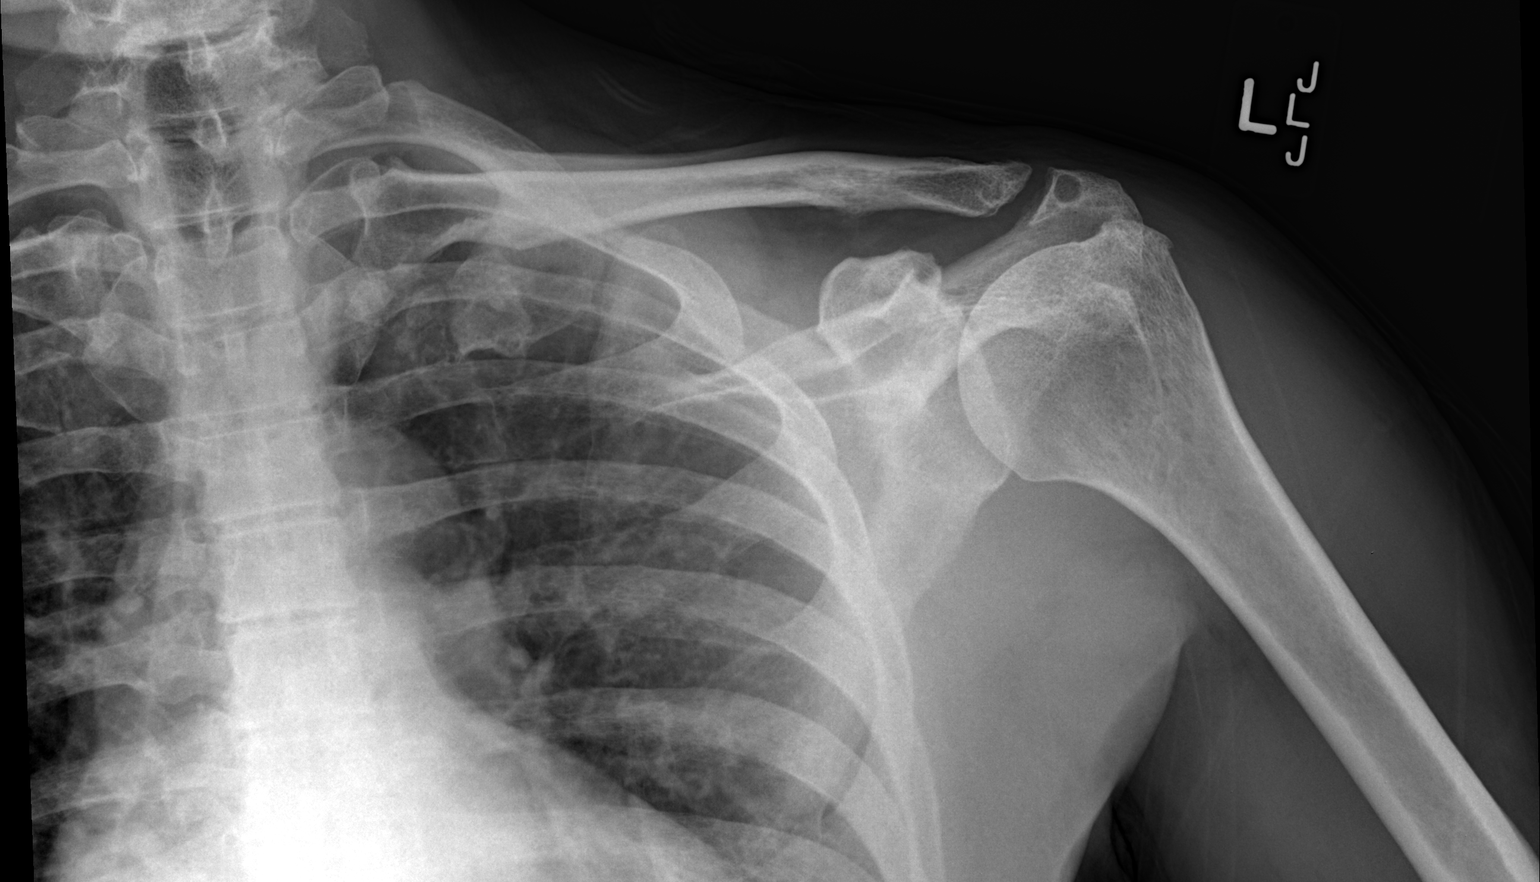
[im 3/3]
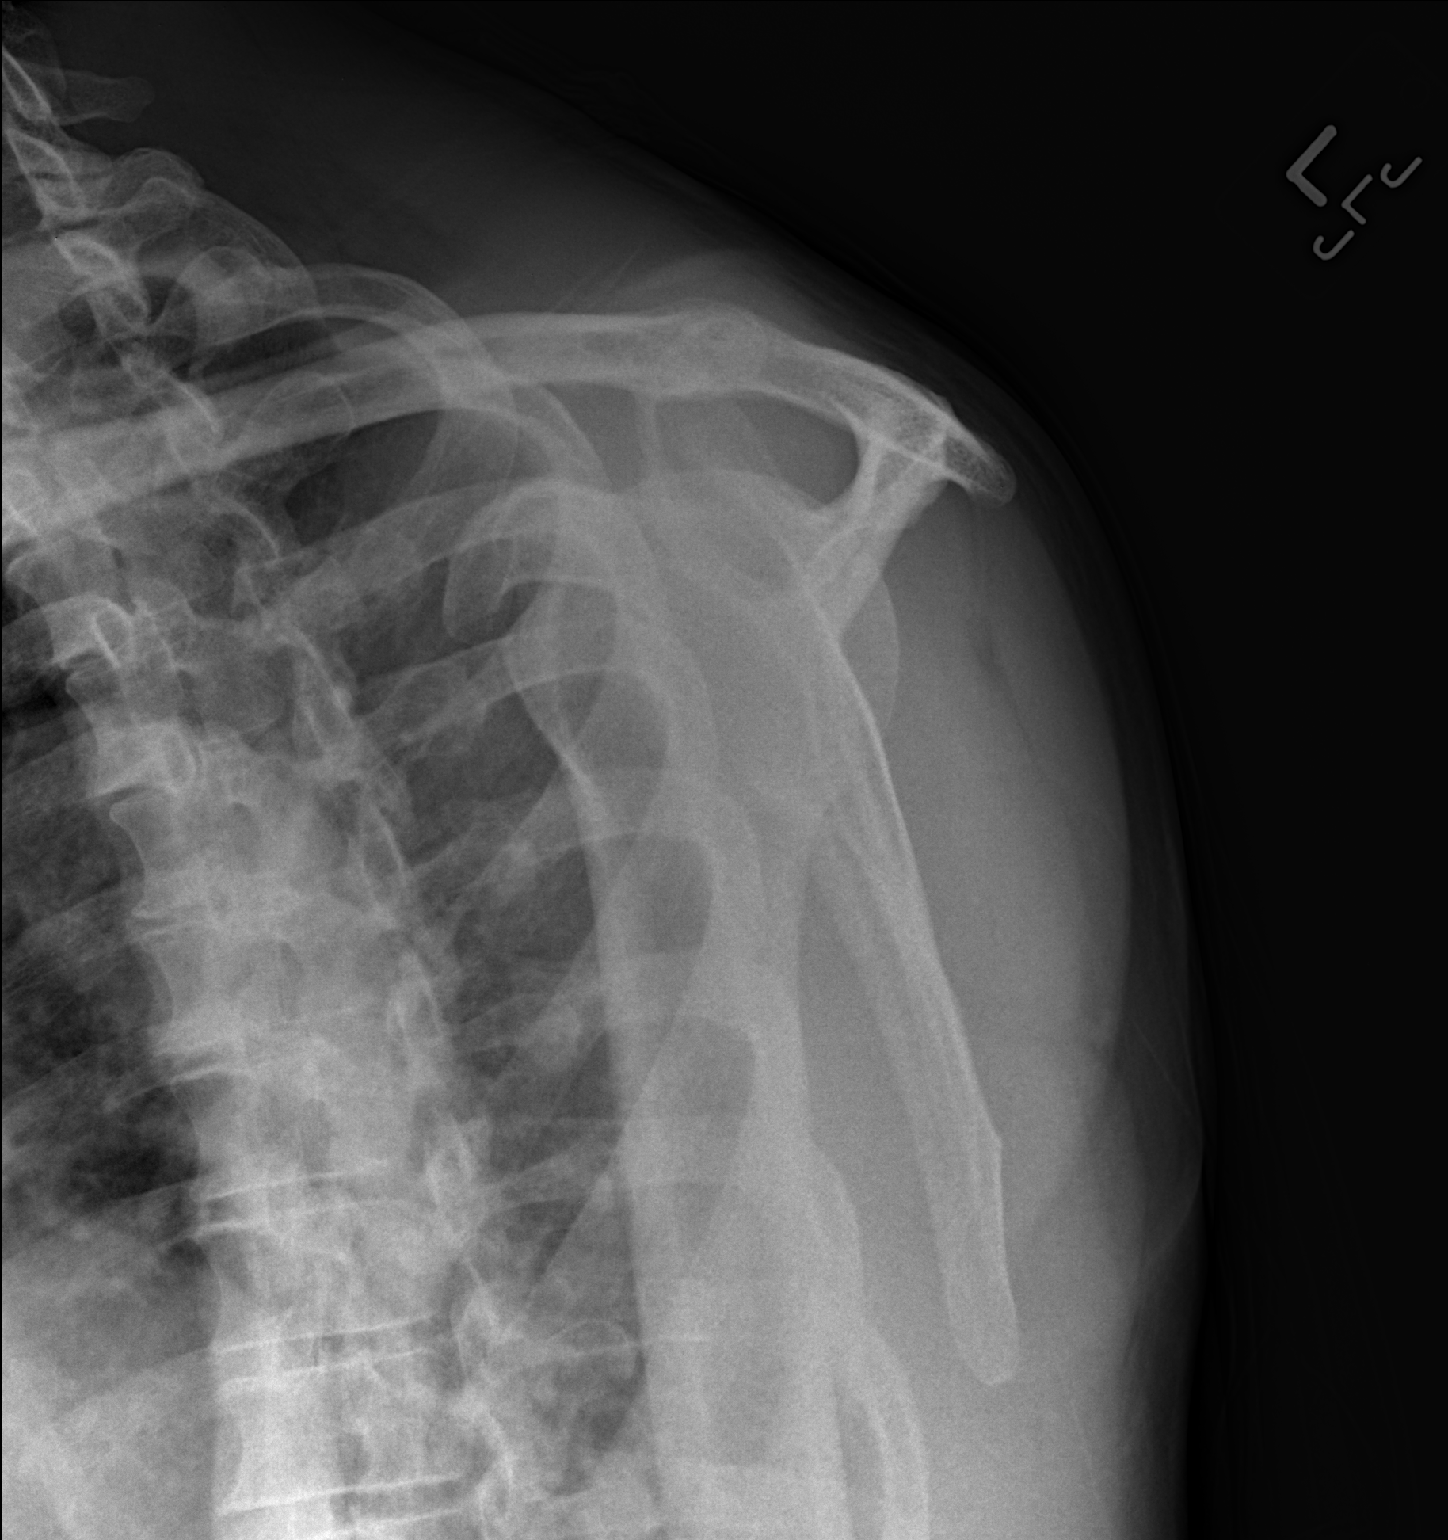

[3 of 3 positions shown; findings below may reference images not displayed]

FINDINGS: Three views of the left shoulder submitted.  Stable mild
widening of the acromioclavicular joint.  Small cystic probable
degenerative changes noted left acromion.  No acute fracture or
subluxation.
IMPRESSION: No acute fracture or subluxation.  Stable mild widening of the AC
joint.  Cystic mild degenerative changes left acromion.

## 2012-01-01 MED ORDER — PREDNISONE 50 MG PO TABS: 50.0000 mg | ORAL_TABLET | Freq: Every day | ORAL | Status: DC

## 2012-01-01 MED ORDER — HYDROCODONE-ACETAMINOPHEN 5-325 MG PO TABS: 1.0000 | ORAL_TABLET | Freq: Four times a day (QID) | ORAL | Status: DC | PRN
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
## 2012-01-01 NOTE — ED Notes (Signed)
Family at bedside. 

## 2012-01-01 NOTE — ED Notes (Signed)
XRAY port at bedside
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
## 2012-01-01 NOTE — ED Provider Notes (Signed)
History     CSN: [id number]  Arrival date & time 01/01/12  0865   First MD Initiated Contact with Patient 01/01/12 [phone]      Chief Complaint  Patient presents with  . Shoulder Pain    left shoulder    (Consider location/radiation/quality/duration/timing/severity/associated sxs/prior treatment) Patient is a 61 y.o. male presenting with shoulder pain.  Shoulder Pain This is a recurrent (left shoulder) problem. The current episode started in the past 7 days. The problem has been unchanged. Pertinent negatives include no chest pain, joint swelling, neck pain or numbness. Associated symptoms comments: Pt states that he had a shoulder injury last year and that his left shoulder hurts recurrently since then.. Exacerbated by: flexion and abduction of left arm. He has tried nothing for the symptoms.    Past Medical History  Diagnosis Date  . Shoulder problem     left shoulder    Past Surgical History  Procedure Date  . Hernia repair     No family history on file.  History  Substance Use Topics  . Smoking status: Current Every Day Smoker  . Smokeless tobacco: Not on file  . Alcohol Use: Yes     Comment: quart a day of beer      Review of Systems  HENT: Negative for neck pain.   Cardiovascular: Negative for chest pain.  Musculoskeletal: Negative for joint swelling.  Neurological: Negative for numbness.    Allergies  Aspirin and Ibuprofen  Home Medications  No current outpatient prescriptions on file.  BP 158/96  Pulse 70  Temp 98.4 F (36.9 C) (Oral)  Resp 18  Ht 5\' 9"  (1.753 m)  Wt 165 lb (74.844 kg)  BMI 24.37 kg/m2  SpO2 98%  Physical Exam  Constitutional: He is oriented to person, place, and time. He appears well-developed and well-nourished. No distress.  HENT:  Head: Normocephalic and atraumatic.  Neck: Normal range of motion.  Pulmonary/Chest: Effort normal.  Musculoskeletal: Normal range of motion.       Pt has limited active ROM of left shoulder  due to pain.  Full passive ROM.  No tenderness on passive ROM unless fully flexed.  Neurological: He is alert and oriented to person, place, and time. He has normal strength. No sensory deficit.  Skin: Skin is warm and dry.    ED Course  Procedures (including critical care time)    The patient will be referred to ortho for further care on his shoulder. The patient is advised to use ice and heat on his shoulder. Told to return here as needed.  MDM          Carlyle Dolly, PA-C 01/03/12 [phone]

## 2012-01-07 NOTE — ED Provider Notes (Signed)
Medical screening examination/treatment/procedure(s) were performed by non-physician practitioner and as supervising physician I was immediately available for consultation/collaboration.  Kaysha Parsell R. Vernice Mannina, MD 01/07/12 0751 

## 2012-05-09 ENCOUNTER — Emergency Department (HOSPITAL_COMMUNITY): Payer: Self-pay

## 2012-05-09 ENCOUNTER — Emergency Department (HOSPITAL_COMMUNITY)
Admission: EM | Admit: 2012-05-09 | Discharge: 2012-05-09 | Disposition: A | Discharge status: Home / Self Care | Payer: Self-pay | Attending: Emergency Medicine | Admitting: Emergency Medicine

## 2012-05-09 ENCOUNTER — Encounter (HOSPITAL_COMMUNITY): Payer: Self-pay | Admitting: Emergency Medicine

## 2012-05-09 DIAGNOSIS — F17200 Nicotine dependence, unspecified, uncomplicated: Secondary | ICD-10-CM | POA: Insufficient documentation

## 2012-05-09 DIAGNOSIS — Z8679 Personal history of other diseases of the circulatory system: Secondary | ICD-10-CM | POA: Insufficient documentation

## 2012-05-09 DIAGNOSIS — R109 Unspecified abdominal pain: Secondary | ICD-10-CM | POA: Insufficient documentation

## 2012-05-09 DIAGNOSIS — Z8719 Personal history of other diseases of the digestive system: Secondary | ICD-10-CM | POA: Insufficient documentation

## 2012-05-09 LAB — CBC WITH DIFFERENTIAL/PLATELET
Basophils Relative: 0 % (ref 0–1)
HCT: 41.4 % (ref 39.0–52.0)
Hemoglobin: 14.4 g/dL (ref 13.0–17.0)
MCH: 31.2 pg (ref 26.0–34.0)
MCHC: 34.8 g/dL (ref 30.0–36.0)
Monocytes Absolute: 0.6 10*3/uL (ref 0.1–1.0)
Monocytes Relative: 13 % — ABNORMAL HIGH (ref 3–12)
Neutro Abs: 2.3 10*3/uL (ref 1.7–7.7)

## 2012-05-09 LAB — URINALYSIS, ROUTINE W REFLEX MICROSCOPIC
Glucose, UA: NEGATIVE mg/dL
Leukocytes, UA: NEGATIVE
Nitrite: NEGATIVE
Protein, ur: NEGATIVE mg/dL
Urobilinogen, UA: 0.2 mg/dL (ref 0.0–1.0)

## 2012-05-09 LAB — COMPREHENSIVE METABOLIC PANEL
Albumin: 3.7 g/dL (ref 3.5–5.2)
BUN: 17 mg/dL (ref 6–23)
Chloride: 101 mEq/L (ref 96–112)
Creatinine, Ser: 0.96 mg/dL (ref 0.50–1.35)
GFR calc Af Amer: >90 mL/min (ref >90)
GFR calc non Af Amer: 89 mL/min — ABNORMAL LOW (ref >90)
Glucose, Bld: 97 mg/dL (ref 70–99)
Total Bilirubin: 0.6 mg/dL (ref 0.3–1.2)

## 2012-05-09 IMAGING — CT CT ABD-PELV W/ CM
1 of 3 series · 14 of 32 positions shown, 19 images · IV contrast (agent unspecified)
Comparison: None.

CLINICAL DATA: Increased  left groin pain following lifting heavy
object.  Previous bilateral inguinal hernia repairs.

CT ABDOMEN AND PELVIS WITH CONTRAST
TECHNIQUE: Multidetector CT imaging of the abdomen and pelvis was
performed following the standard protocol during bolus
administration of intravenous contrast.
Contrast: 100 ml [72]

[Series 2: abd/pel with · axial · 0.74mm/px · z∈[+900,+1280]mm · 14 of 86 slices shown, 19 images]
[im 5/86  soft-tissue]
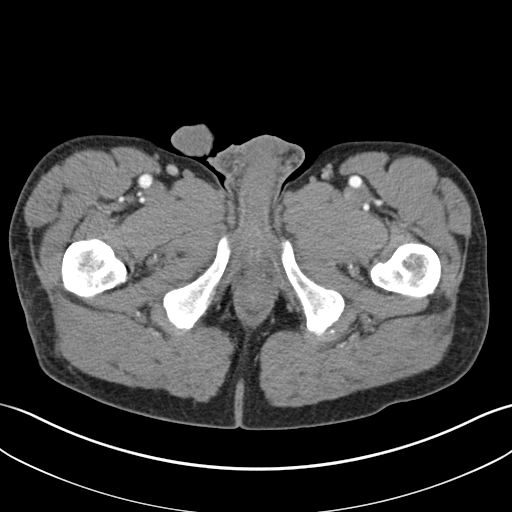
[im 5/86  bone]
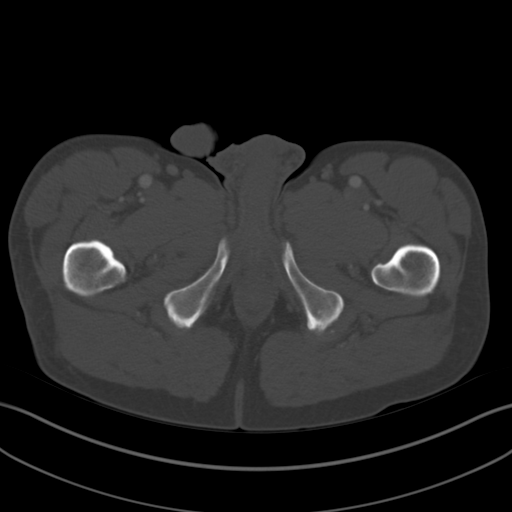
[im 10/86  soft-tissue]
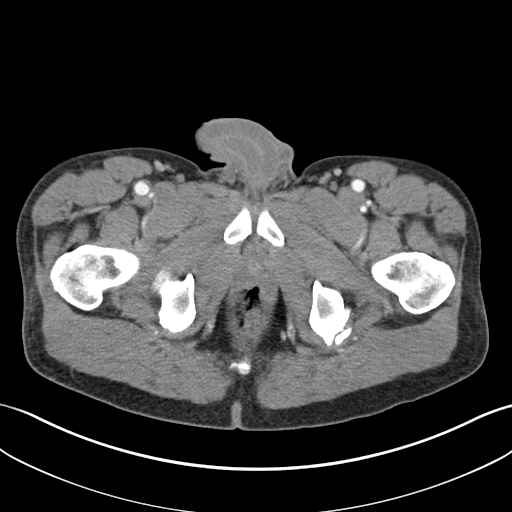
[im 19/86  soft-tissue]
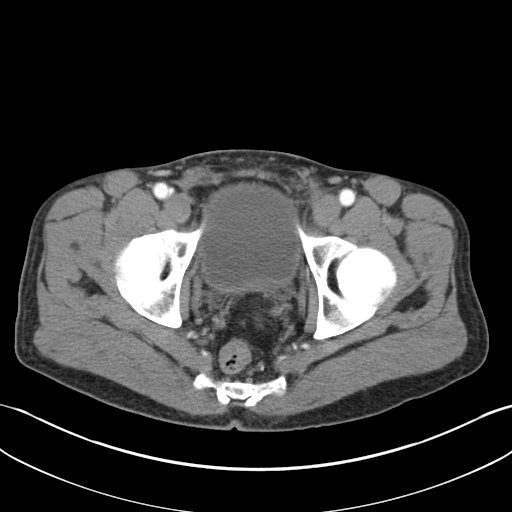
[im 24/86  soft-tissue]
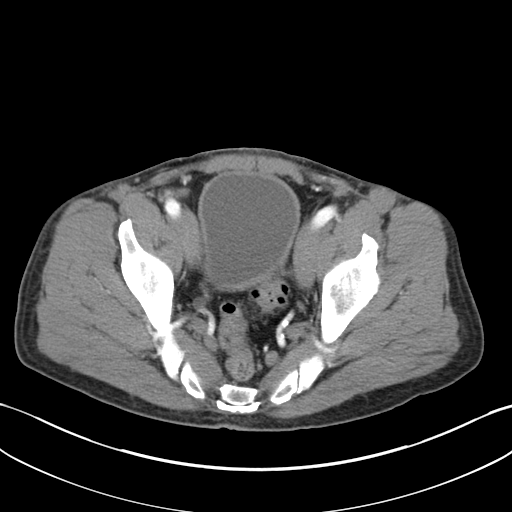
[im 29/86  soft-tissue]
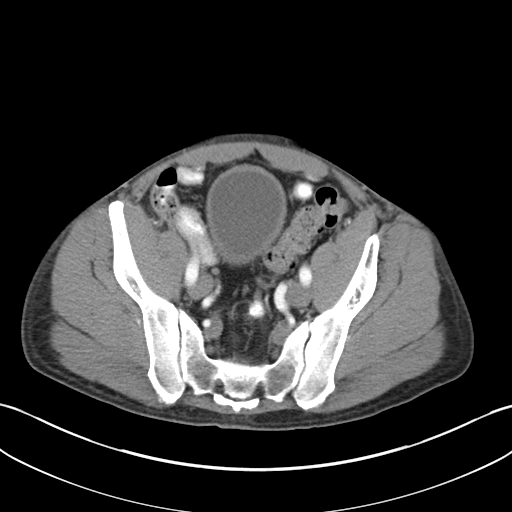
[im 38/86  soft-tissue]
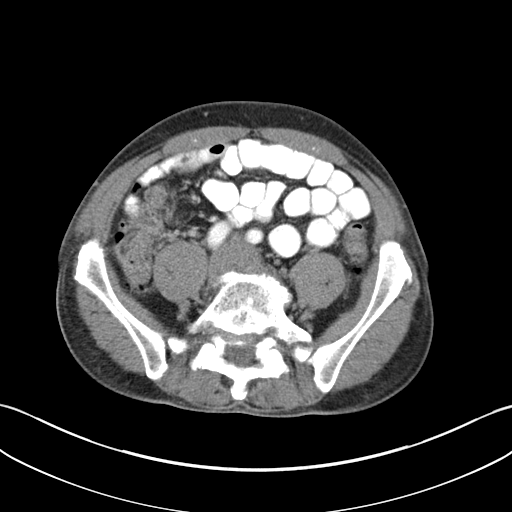
[im 43/86  soft-tissue]
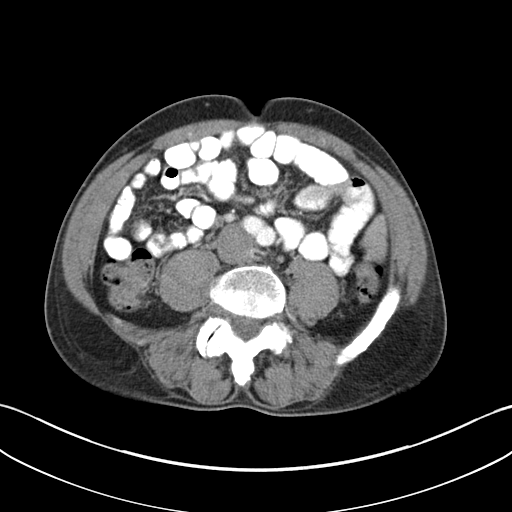
[im 48/86  soft-tissue]
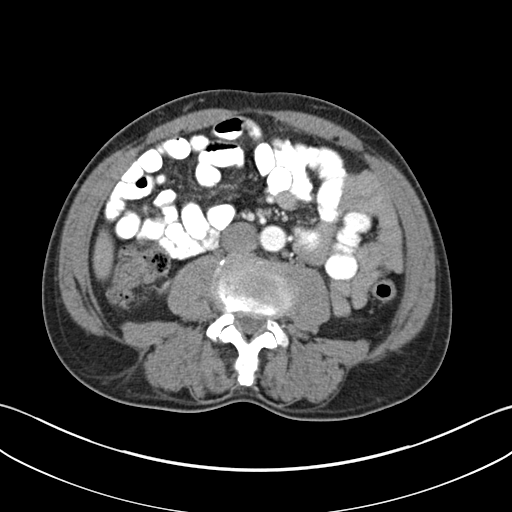
[im 57/86  soft-tissue]
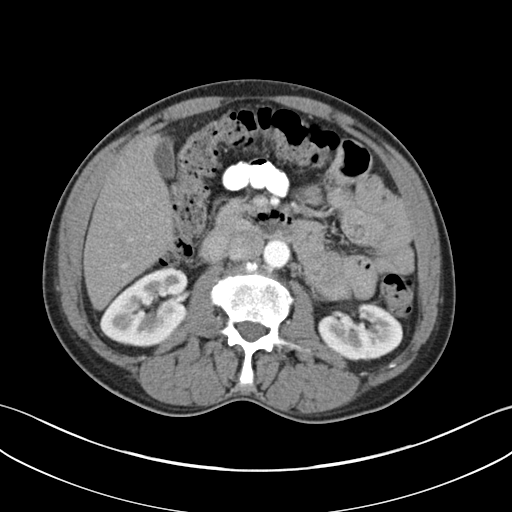
[im 57/86  bone]
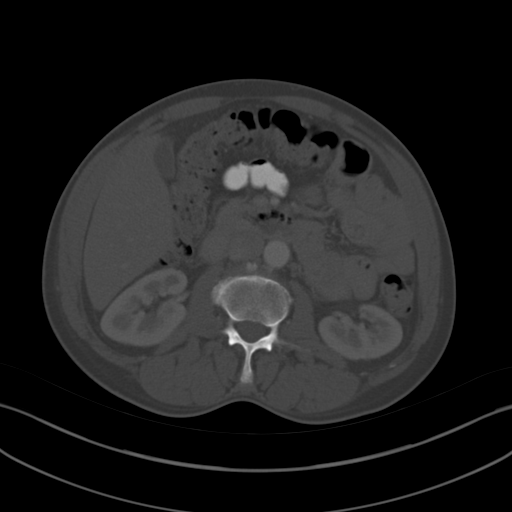
[im 62/86  soft-tissue]
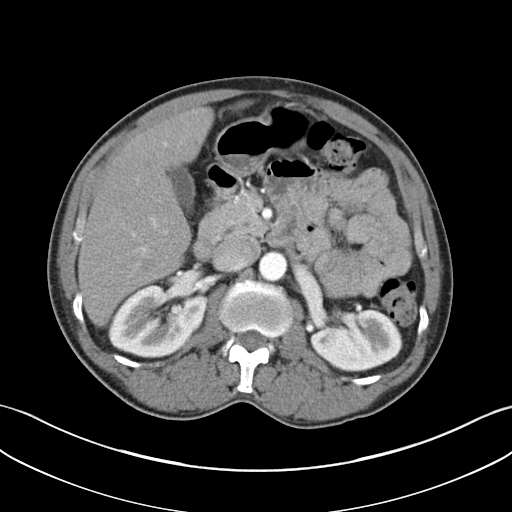
[im 67/86  soft-tissue]
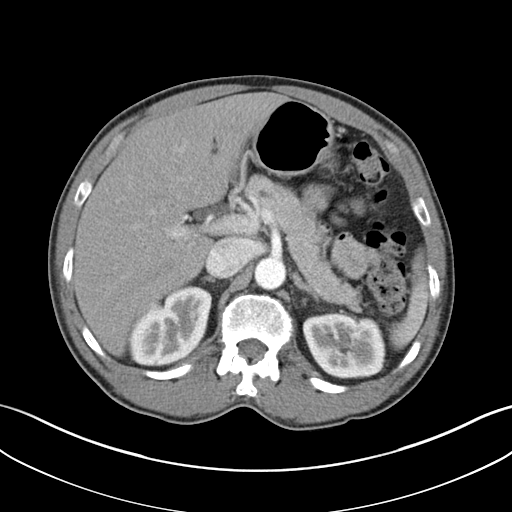
[im 67/86  lung]
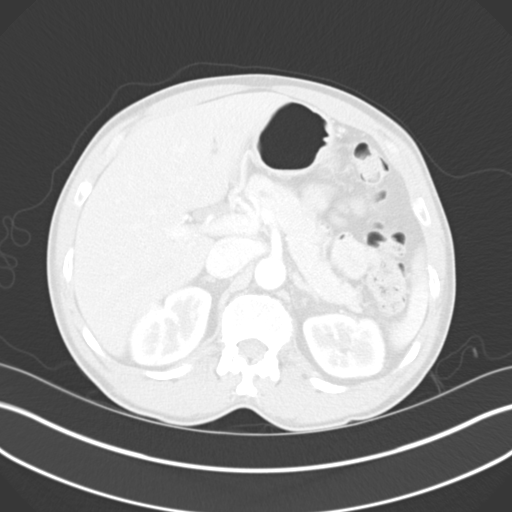
[im 71/86  lung]
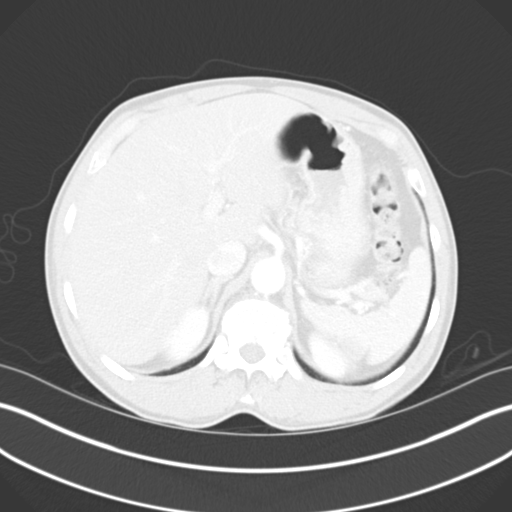
[im 76/86  soft-tissue]
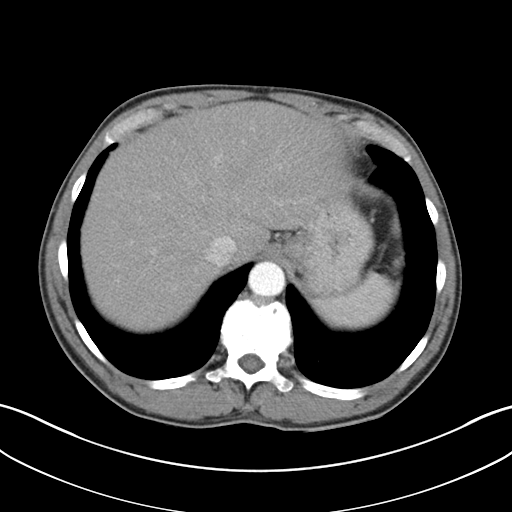
[im 76/86  lung]
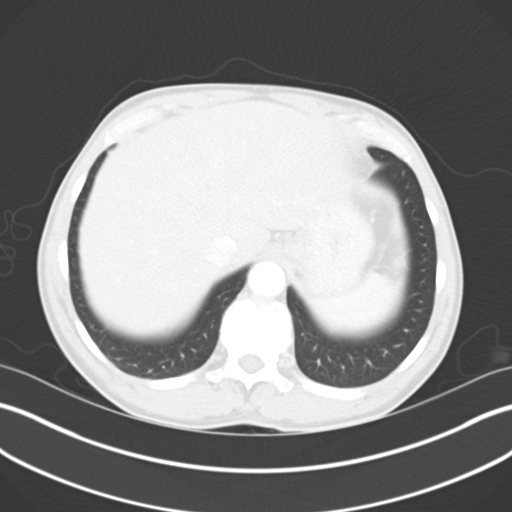
[im 81/86  soft-tissue]
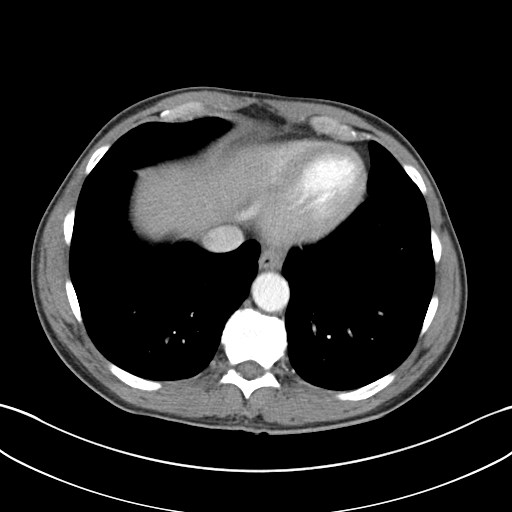
[im 81/86  lung]
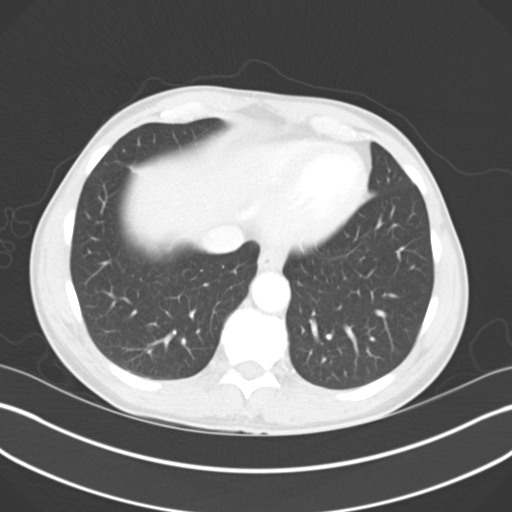

[14 of 32 positions shown; findings below may reference images not displayed]

FINDINGS: No evidence of mass, hematoma, or hernia in the groin or
inguinal regions.  Mildly enlarged prostate gland is noted.
Seminal vesicles are symmetric.

No soft tissue masses or lymphadenopathy identified within the
abdomen or pelvis.  No evidence of inflammatory process or abnormal
fluid collections.  No evidence of dilated bowel loops.

The liver, gallbladder, pancreas, spleen, adrenal glands, and
kidneys are normal in appearance.  No evidence of hydronephrosis.
No suspicious bone lesions are identified.  Incidental note is made
of bilateral L5 pars defects, with degenerative disc disease and
grade 1 anterolisthesis at L5-S1.
IMPRESSION: 1.  No evidence of inguinal hernia or mass, or other acute
findings.
2.  Mildly enlarged prostate.
3.  Bilateral L5 pars defects with degenerative disc disease and
grade 1 anterolisthesis at L5-S1.

## 2012-05-09 IMAGING — US US SCROTUM
1 series · 14 of 25 positions shown · non-contrast
Comparison: None

CLINICAL DATA: Right testicular pain.

SCROTAL ULTRASOUND
DOPPLER ULTRASOUND OF THE TESTICLES
TECHNIQUE: Complete ultrasound examination of the testicles,
epididymis, and other scrotal structures was performed.  Color and
spectral Doppler ultrasound were also utilized to evaluate blood
flow to the testicles.

[Series 1: us scrotum · 0.08mm/px · 14 of 44 slices shown]
[im 1/44]
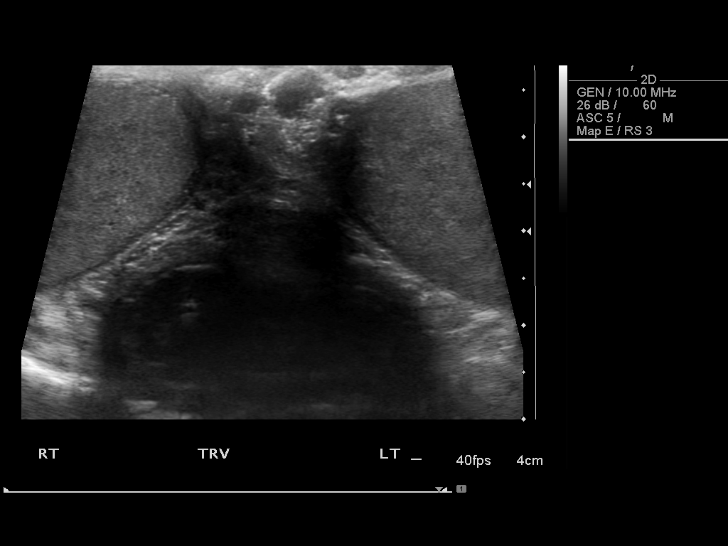
[im 4/44]
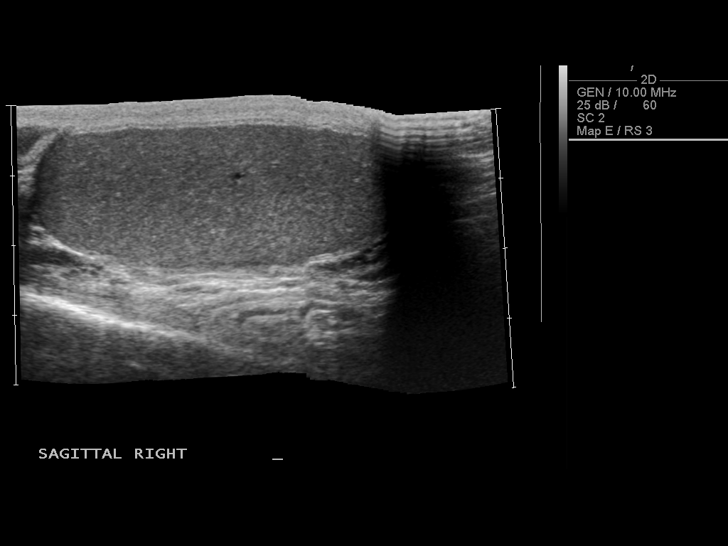
[im 8/44]
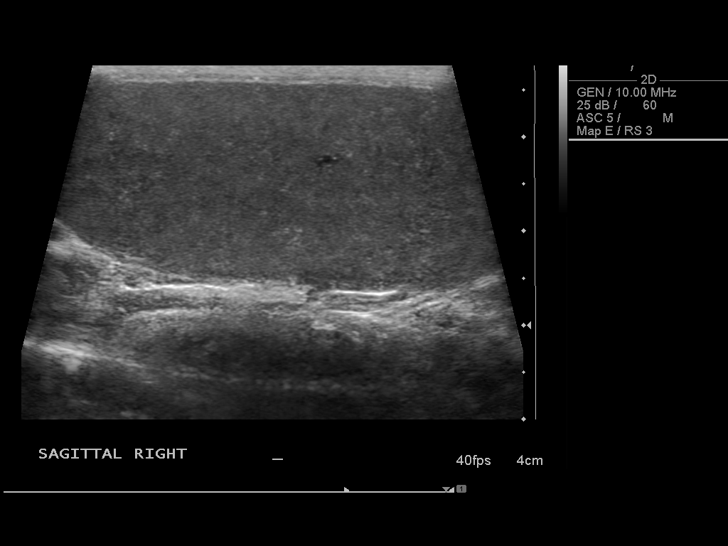
[im 11/44]
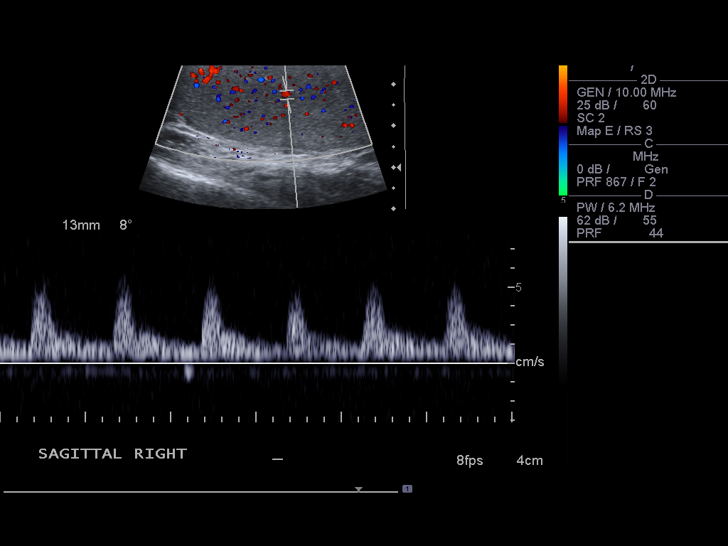
[im 15/44]
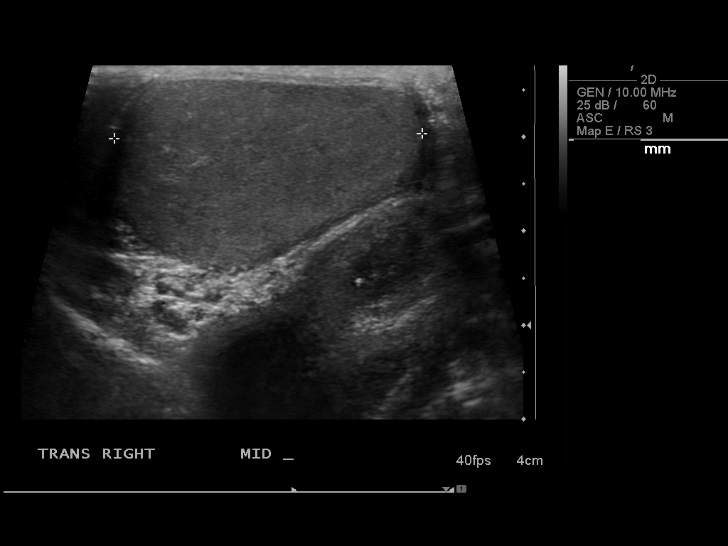
[im 17/44]
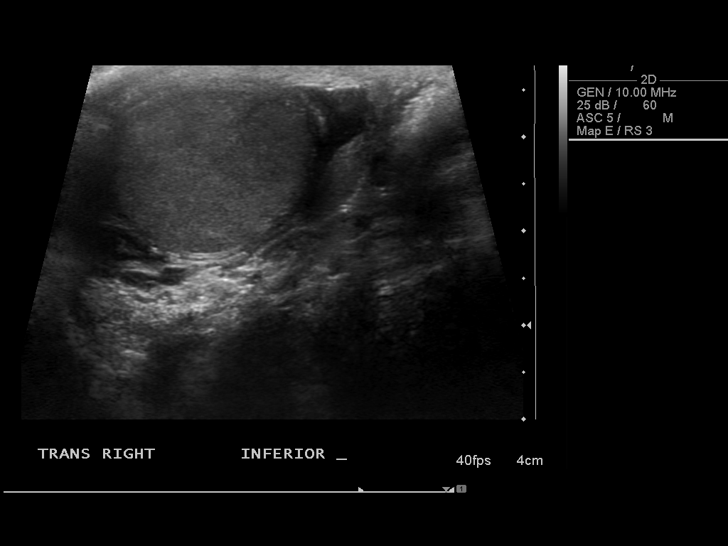
[im 20/44]
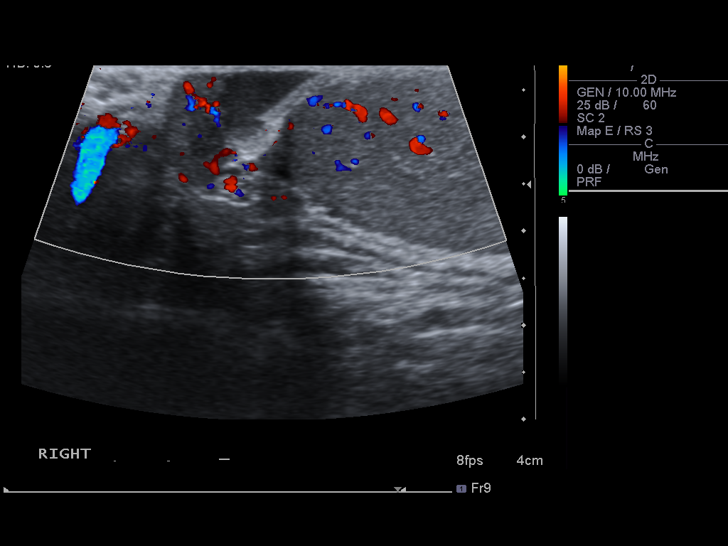
[im 24/44]
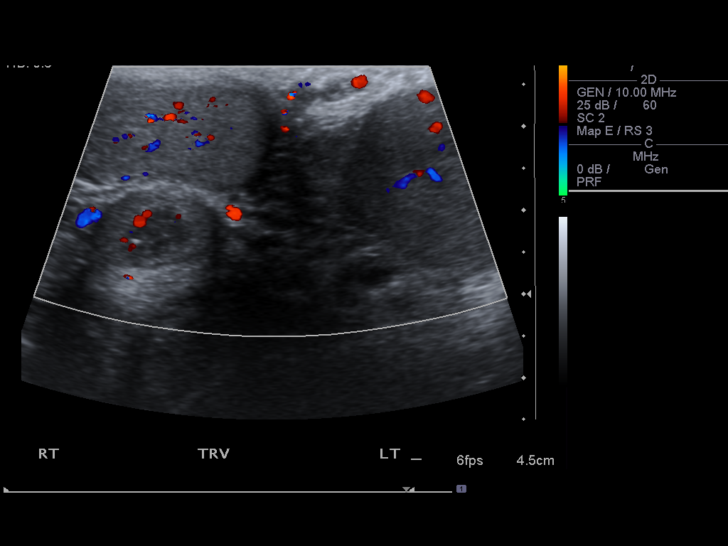
[im 27/44]
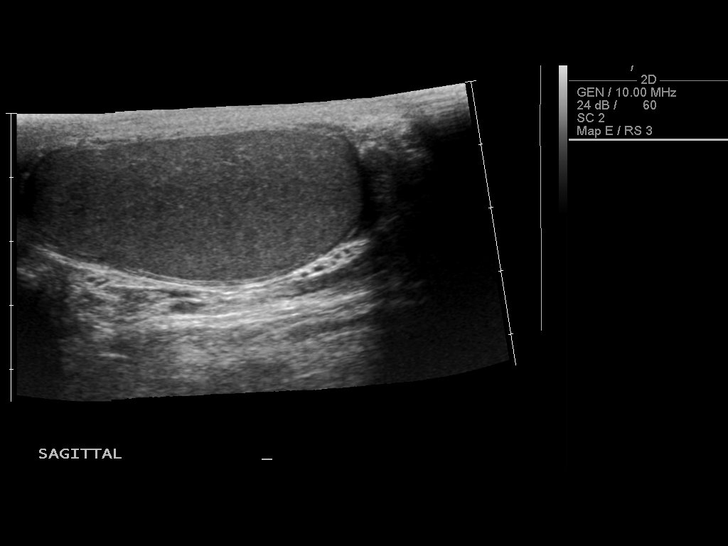
[im 29/44]
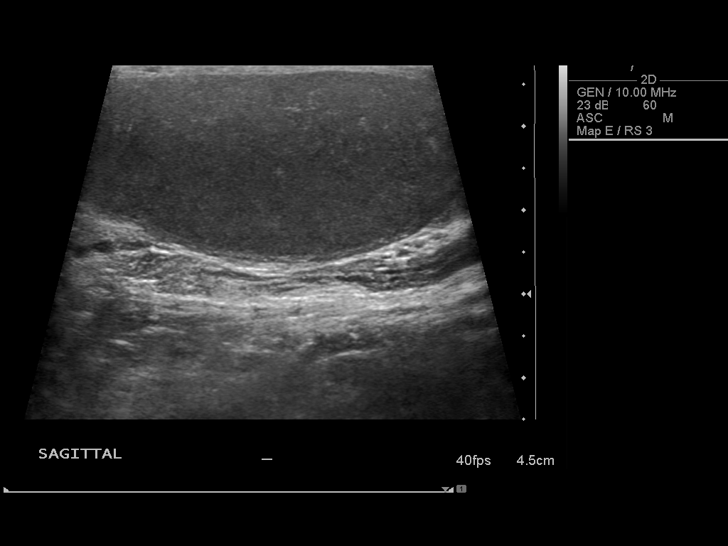
[im 33/44]
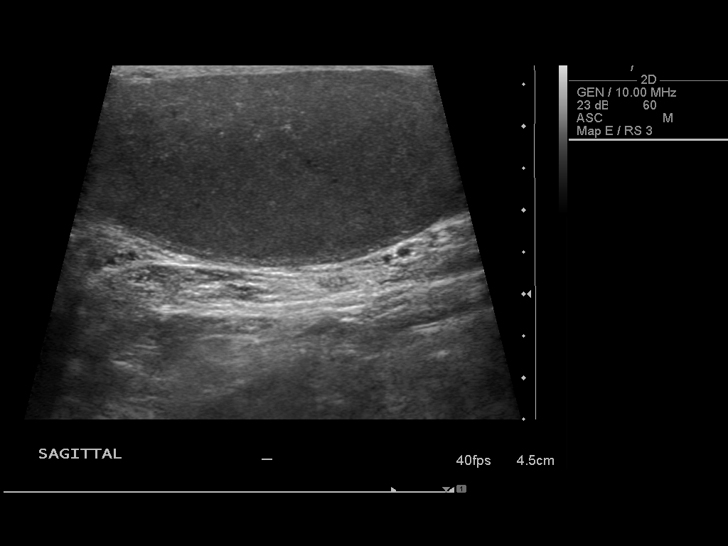
[im 36/44]
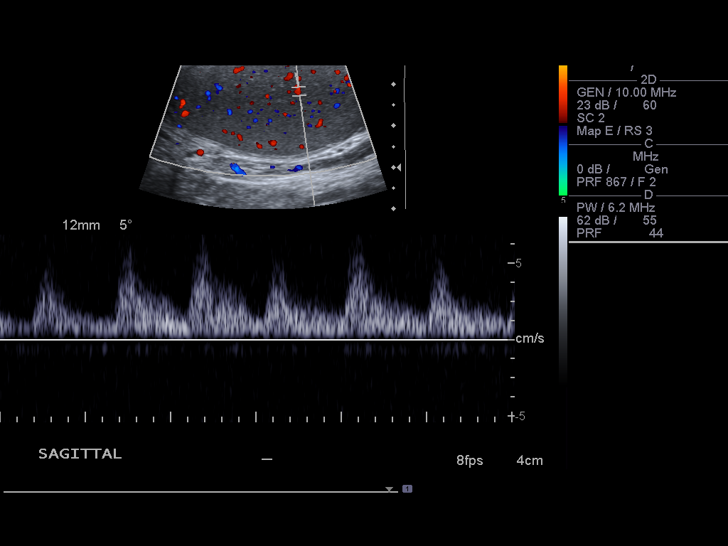
[im 40/44]
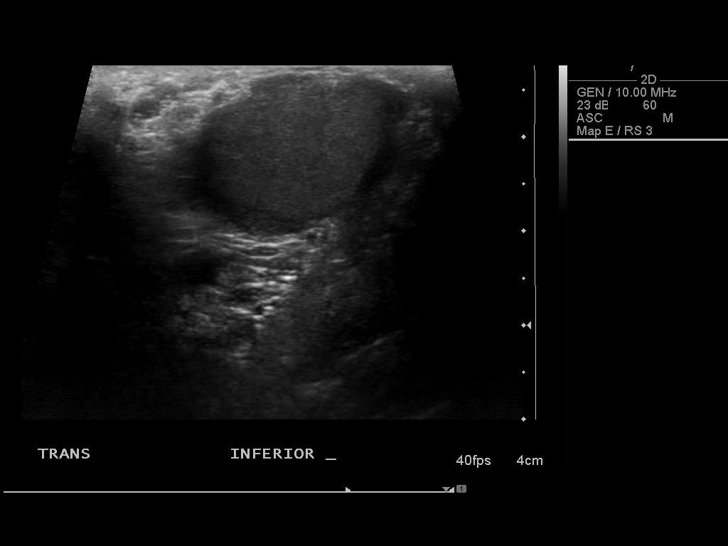
[im 44/44]
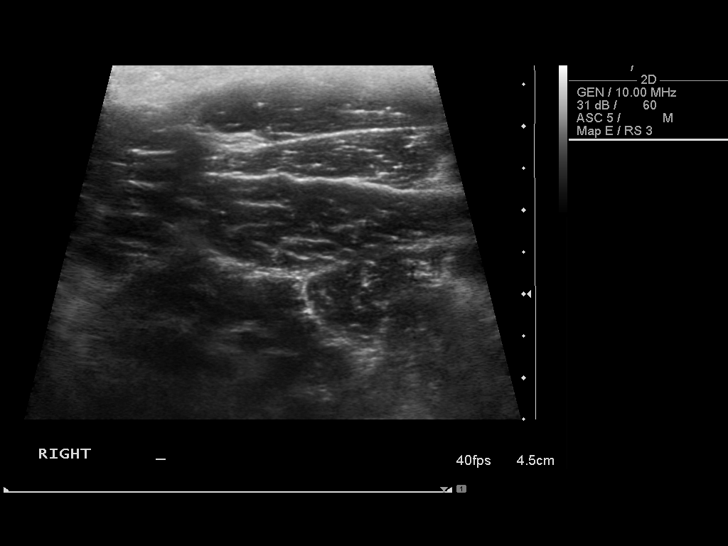

[14 of 25 positions shown; findings below may reference images not displayed]

FINDINGS: Right testis:  5.1 x 2.1 x 3.3 cm. No testicular mass identified.
No evidence of microlithiasis.  Internal blood flow is seen on
color Doppler ultrasound.

Left testis:  5.4 x 2.3 x 3.2 cm.  No testicular mass identified.
No evidence of microlithiasis.  Internal blood flow is seen on
color Doppler ultrasound.

Right epididymis:  A small less than 1 cm complex cyst or
spermatocele is seen in the right epididymal head.

Left epididymis:  Normal in size and appearance.

Hydrocele:  None visualized

Varicocele:  None visualized

Pulsed Doppler interrogation of both testes demonstrates low
resistance flow bilaterally.
IMPRESSION: 1.  No evidence of testicular mass or torsion.
2.  Small less than 1 cm right epididymal cyst or spermatocele.

## 2012-05-09 MED ORDER — IOHEXOL 300 MG/ML  SOLN
50.0000 mL | Freq: Once | INTRAMUSCULAR | Status: AC | PRN
  Administered 2012-05-09: 50 mL via ORAL

## 2012-05-09 MED ORDER — HYDROCODONE-ACETAMINOPHEN 5-325 MG PO TABS: 2.0000 | ORAL_TABLET | ORAL | Status: DC | PRN

## 2012-05-09 MED ORDER — IOHEXOL 300 MG/ML  SOLN
100.0000 mL | Freq: Once | INTRAMUSCULAR | Status: AC | PRN
  Administered 2012-05-09: 100 mL via INTRAVENOUS

## 2012-05-09 NOTE — ED Notes (Signed)
Pt presents w/ left groin pain, states 3 wk hx of lifting something heavy and has had pain since. Normal BMs and no nausea/vomiting. Pain getting worse. Hx of bilateral hernia repairs 10+ years ago.

## 2012-05-09 NOTE — ED Provider Notes (Signed)
History     CSN: [id number]  Arrival date & time 05/09/12  1304   First MD Initiated Contact with Patient 05/09/12 1320      Chief Complaint  Patient presents with  . Groin Pain    (Consider location/radiation/quality/duration/timing/severity/associated sxs/prior treatment) HPI Comments: Patient presents with right groin pain as been intermittent for the past 3 weeks but worse over the past several days. It started after he lifted a heavy sewer grates. It is worse with walking and better with rest. Denies any nausea, vomiting, change in bowel or bladder habits. Good by mouth intake and urine output. No fevers or chills. History of hernia repair 15 years ago. He states he has pain at the site of the previous hernia. It somewhat radiates to his right testicle.  The history is provided by the patient.    Past Medical History  Diagnosis Date  . Shoulder problem     left shoulder    Past Surgical History  Procedure Laterality Date  . Hernia repair      No family history on file.  History  Substance Use Topics  . Smoking status: Current Every Day Smoker -- 0.50 packs/day    Types: Cigarettes  . Smokeless tobacco: Never Used  . Alcohol Use: Yes     Comment: quart to 1.5 after work each day of beer      Review of Systems  Constitutional: Negative for fever, activity change and appetite change.  HENT: Negative for congestion and rhinorrhea.   Respiratory: Negative for cough, chest tightness and shortness of breath.   Cardiovascular: Negative for chest pain.  Gastrointestinal: Positive for abdominal pain. Negative for nausea, vomiting and diarrhea.  Genitourinary: Positive for testicular pain. Negative for dysuria and hematuria.  Musculoskeletal: Negative for back pain.  Skin: Negative for rash.  Neurological: Negative for dizziness, weakness and headaches.  A complete 10 system review of systems was obtained and all systems are negative except as noted in the HPI and PMH.     Allergies  Aspirin and Ibuprofen  Home Medications   Current Outpatient Rx  Name  Route  Sig  Dispense  Refill  . HYDROcodone-acetaminophen (NORCO/VICODIN) 5-325 MG per tablet   Oral   Take 2 tablets by mouth every 4 (four) hours as needed for pain.   10 tablet   0     BP 137/78  Pulse 75  Temp(Src) 98.4 F (36.9 C) (Oral)  Resp 14  SpO2 100%  Physical Exam  Constitutional: He is oriented to person, place, and time. He appears well-developed and well-nourished. No distress.  HENT:  Head: Normocephalic and atraumatic.  Mouth/Throat: Oropharynx is clear and moist. No oropharyngeal exudate.  Eyes: Conjunctivae and EOM are normal. Pupils are equal, round, and reactive to light.  Neck: Normal range of motion. Neck supple.  Cardiovascular: Normal rate, regular rhythm and normal heart sounds.   No murmur heard. Pulmonary/Chest: Effort normal and breath sounds normal. No respiratory distress.  Abdominal: Soft. There is no tenderness. There is no rebound and no guarding.  No appreciable inguanal hernia on exam. Tenderness palpation over right angle canal. No cellulitis or abscess.  Genitourinary:  Right testicle nontender.  Musculoskeletal: Normal range of motion. He exhibits no edema and no tenderness.  Neurological: He is alert and oriented to person, place, and time. No cranial nerve deficit. He exhibits normal muscle tone. Coordination normal.  Skin: Skin is warm.    ED Course  Procedures (including critical care time)  Labs  Reviewed  CBC WITH DIFFERENTIAL - Abnormal; Notable for the following:    Monocytes Relative 13 (*)    All other components within normal limits  COMPREHENSIVE METABOLIC PANEL - Abnormal; Notable for the following:    GFR calc non Af Amer 89 (*)    All other components within normal limits  URINALYSIS, ROUTINE W REFLEX MICROSCOPIC   US Scrotum  05/09/2012  *RADIOLOGY REPORT*  Clinical Data:  Right testicular pain.  SCROTAL ULTRASOUND DOPPLER  ULTRASOUND OF THE TESTICLES  Technique: Complete ultrasound examination of the testicles, epididymis, and other scrotal structures was performed.  Color and spectral Doppler ultrasound were also utilized to evaluate blood flow to the testicles.  Comparison:  None  Findings:  Right testis:  5.1 x 2.1 x 3.3 cm. No testicular mass identified. No evidence of microlithiasis.  Internal blood flow is seen on color Doppler ultrasound.  Left testis:  5.4 x 2.3 x 3.2 cm.  No testicular mass identified. No evidence of microlithiasis.  Internal blood flow is seen on color Doppler ultrasound.  Right epididymis:  A small less than 1 cm complex cyst or spermatocele is seen in the right epididymal head.  Left epididymis:  Normal in size and appearance.  Hydrocele:  None visualized  Varicocele:  None visualized  Pulsed Doppler interrogation of both testes demonstrates low resistance flow bilaterally.  IMPRESSION:  1.  No evidence of testicular mass or torsion. 2.  Small less than 1 cm right epididymal cyst or spermatocele.   Original Report Authenticated By: Myles Rosenthal, M.D.    Ct Abdomen Pelvis W Contrast  05/09/2012  *RADIOLOGY REPORT*  Clinical Data: Increased  left groin pain following lifting heavy object.  Previous bilateral inguinal hernia repairs.  CT ABDOMEN AND PELVIS WITH CONTRAST  Technique:  Multidetector CT imaging of the abdomen and pelvis was performed following the standard protocol during bolus administration of intravenous contrast.  Contrast: 100 ml Omnipaque-300  Comparison: None.  Findings: No evidence of mass, hematoma, or hernia in the groin or inguinal regions.  Mildly enlarged prostate gland is noted. Seminal vesicles are symmetric.  No soft tissue masses or lymphadenopathy identified within the abdomen or pelvis.  No evidence of inflammatory process or abnormal fluid collections.  No evidence of dilated bowel loops.  The liver, gallbladder, pancreas, spleen, adrenal glands, and kidneys are normal in  appearance.  No evidence of hydronephrosis. No suspicious bone lesions are identified.  Incidental note is made of bilateral L5 pars defects, with degenerative disc disease and grade 1 anterolisthesis at L5-S1.  IMPRESSION:  1.  No evidence of inguinal hernia or mass, or other acute findings. 2.  Mildly enlarged prostate. 3.  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   Original Report Authenticated By: Myles Rosenthal, M.D.    [place] Art/ven Flow Abd Pelv Doppler  05/09/2012  *RADIOLOGY REPORT*  Clinical Data:  Right testicular pain.  SCROTAL ULTRASOUND DOPPLER ULTRASOUND OF THE TESTICLES  Technique: Complete ultrasound examination of the testicles, epididymis, and other scrotal structures was performed.  Color and spectral Doppler ultrasound were also utilized to evaluate blood flow to the testicles.  Comparison:  None  Findings:  Right testis:  5.1 x 2.1 x 3.3 cm. No testicular mass identified. No evidence of microlithiasis.  Internal blood flow is seen on color Doppler ultrasound.  Left testis:  5.4 x 2.3 x 3.2 cm.  No testicular mass identified. No evidence of microlithiasis.  Internal blood flow  is seen on color Doppler ultrasound.  Right epididymis:  A small less than 1 cm complex cyst or spermatocele is seen in the right epididymal head.  Left epididymis:  Normal in size and appearance.  Hydrocele:  None visualized  Varicocele:  None visualized  Pulsed Doppler interrogation of both testes demonstrates low resistance flow bilaterally.  IMPRESSION:  1.  No evidence of testicular mass or torsion. 2.  Small less than 1 cm right epididymal cyst or spermatocele.   Original Report Authenticated By: Myles Rosenthal, M.D.      1. Right inguinal pain       MDM  Right groin pain x several weeks after lifting sewer cover. History of inguinal hernia repair years ago. No nausea, vomiting, bowel bladder changes. Patient does have right testicular pain with no urinary  symptoms.   UA negative. [place] negative for testicular torsion. No evidence of hernia on exam. No evidence of acute abdomen. Treat as muscular pain and followup with surgeon.      Glynn Octave, MD 05/09/12 [phone]

## 2012-07-19 ENCOUNTER — Emergency Department (INDEPENDENT_AMBULATORY_CARE_PROVIDER_SITE_OTHER)
Admission: EM | Admit: 2012-07-19 | Discharge: 2012-07-19 | Disposition: A | Discharge status: Home / Self Care | Payer: Self-pay | Source: Home / Self Care | Attending: Emergency Medicine | Admitting: Emergency Medicine

## 2012-07-19 ENCOUNTER — Encounter (HOSPITAL_COMMUNITY): Payer: Self-pay | Admitting: Emergency Medicine

## 2012-07-19 DIAGNOSIS — N50811 Right testicular pain: Secondary | ICD-10-CM

## 2012-07-19 DIAGNOSIS — N509 Disorder of male genital organs, unspecified: Secondary | ICD-10-CM

## 2012-07-19 DIAGNOSIS — N400 Enlarged prostate without lower urinary tract symptoms: Secondary | ICD-10-CM

## 2012-07-19 DIAGNOSIS — N4340 Spermatocele of epididymis, unspecified: Secondary | ICD-10-CM

## 2012-07-19 DIAGNOSIS — N4341 Spermatocele of epididymis, single: Secondary | ICD-10-CM

## 2012-07-19 LAB — POCT URINALYSIS DIP (DEVICE)
Glucose, UA: NEGATIVE mg/dL
Leukocytes, UA: NEGATIVE
Nitrite: NEGATIVE
Protein, ur: NEGATIVE mg/dL
Urobilinogen, UA: 0.2 mg/dL (ref 0.0–1.0)

## 2012-07-19 MED ORDER — TRAMADOL HCL 50 MG PO TABS: 50.0000 mg | ORAL_TABLET | Freq: Four times a day (QID) | ORAL | Status: DC | PRN

## 2012-07-19 NOTE — ED Provider Notes (Signed)
History     CSN: [id number]  Arrival date & time 07/19/12  1118   First MD Initiated Contact with Patient 07/19/12 1156      Chief Complaint  Patient presents with  . Hematuria    (Consider location/radiation/quality/duration/timing/severity/associated sxs/prior treatment) HPI Comments: Patient presents urgent care complaining of ongoing right inguinal and right testicular discomfort. The pain and discomfort at times gets better. He also reports occasionally sees blood in his urine. And sometimes they penile clear discharge. Patient denies any fevers, burning or pressure with urination, denies any lower abdominal pain at this point her back pain. He describes he was seen in the emergency department a few weeks ago and had several imaging studies including a CT and an ultrasound of his testicle. He is unaware of any abnormal results.  Patient denies any constitutional symptoms such as fevers, generalized malaise, unintentional weight loss or chronic back pain.  Today during interview have clarified-and discuss CT and ultrasound results the patient abnormal findings which include a right testicular spermatocele as well as a prostate enlargement.  Patient is a 62 y.o. male presenting with hematuria and testicular pain. The history is provided by the patient.  Hematuria This is a recurrent problem. The current episode started more than 1 week ago. The problem occurs constantly. The problem has not changed since onset.Associated symptoms include abdominal pain. Pertinent negatives include no chest pain and no shortness of breath. The symptoms are aggravated by bending. The treatment provided no relief.  Testicle Pain This is a recurrent problem. The current episode started more than 1 week ago. The problem occurs constantly. The problem has not changed since onset.Associated symptoms include abdominal pain. Pertinent negatives include no chest pain and no shortness of breath. Nothing aggravates  the symptoms. The treatment provided no relief.    Past Medical History  Diagnosis Date  . Shoulder problem     left shoulder    Past Surgical History  Procedure Laterality Date  . Hernia repair      No family history on file.  History  Substance Use Topics  . Smoking status: Current Every Day Smoker -- 0.50 packs/day    Types: Cigarettes  . Smokeless tobacco: Never Used  . Alcohol Use: Yes     Comment: quart to 1.5 after work each day of beer      Review of Systems  Constitutional: Negative for fever, chills, diaphoresis, activity change, appetite change, fatigue and unexpected weight change.  Respiratory: Negative for shortness of breath.   Cardiovascular: Negative for chest pain.  Gastrointestinal: Positive for abdominal pain.  Genitourinary: Positive for hematuria and testicular pain. Negative for dysuria, urgency, frequency, flank pain, decreased urine volume, discharge, penile swelling, scrotal swelling, enuresis, difficulty urinating, genital sores and penile pain.  Musculoskeletal: Negative for myalgias, joint swelling and arthralgias.  Skin: Negative for color change, pallor, rash and wound.  Hematological: Negative for adenopathy. Does not bruise/bleed easily.    Allergies  Aspirin and Ibuprofen  Home Medications   Current Outpatient Rx  Name  Route  Sig  Dispense  Refill  . HYDROcodone-acetaminophen (NORCO/VICODIN) 5-325 MG per tablet   Oral   Take 2 tablets by mouth every 4 (four) hours as needed for pain.   10 tablet   0   . traMADol (ULTRAM) 50 MG tablet   Oral   Take 1 tablet (50 mg total) by mouth every 6 (six) hours as needed for pain.   15 tablet   0  BP 153/94  Pulse 80  Temp(Src) 98.2 F (36.8 C) (Oral)  Resp 16  SpO2 98%  Physical Exam  Nursing note and vitals reviewed. Constitutional: He appears well-developed and well-nourished.  Abdominal: He exhibits no distension and no mass. There is no tenderness. There is no rebound  and no guarding. Hernia confirmed negative in the right inguinal area and confirmed negative in the left inguinal area.  Genitourinary: Penis normal.    Right testis shows tenderness. Right testis shows no mass and no swelling. Cremasteric reflex is not absent on the right side. Left testis shows no mass, no swelling and no tenderness. Left testis is descended. Cremasteric reflex is not absent on the left side. No penile tenderness.  Lymphadenopathy:       Right: No inguinal adenopathy present.       Left: No inguinal adenopathy present.  Neurological: He is alert.  Skin: No rash noted. No erythema. No pallor.    ED Course  Procedures (including critical care time)  Labs Reviewed  POCT URINALYSIS DIP (DEVICE) - Abnormal; Notable for the following:    Hgb urine dipstick TRACE (*)    All other components within normal limits  URINE CULTURE   No results found.   1. Spermatocele of epididymis, single   2. Right testicular pain   3. Prostate enlargement       MDM  Problem #1 Right inguinal and testicular recurrent discomfort. Most likely related to recently diagnosed spermatocele ( right testicular epididymal cyst complex). I have discussed with patient his recent ultrasound results. He reports he was unaware of this finding. Have also discussed with him prostate enlargement. Instructed and recommended patient to followup with the urologist for further treatment and also to explore and rule out the possibility of a prostate malignancy. Patient and wife understands and agrees with necessary follow-up to date up.  Rx-ultram-        Jimmie Molly, MD 07/19/12 1248

## 2012-07-19 NOTE — ED Notes (Signed)
Pt c/o blood in the urine onset 2 months... Was seen at [facility] for the same reason Sx include groin pain and dysuria Denies: f/v/n/d, back pain, abd pain He is alert and oriented w/no signs of acute distress.

## 2012-07-20 LAB — URINE CULTURE

## 2012-09-27 ENCOUNTER — Emergency Department (HOSPITAL_COMMUNITY)
Admission: EM | Admit: 2012-09-27 | Discharge: 2012-09-27 | Disposition: A | Discharge status: Home / Self Care | Payer: Self-pay | Attending: Emergency Medicine | Admitting: Emergency Medicine

## 2012-09-27 ENCOUNTER — Encounter (HOSPITAL_COMMUNITY): Payer: Self-pay | Admitting: Emergency Medicine

## 2012-09-27 DIAGNOSIS — F17200 Nicotine dependence, unspecified, uncomplicated: Secondary | ICD-10-CM | POA: Insufficient documentation

## 2012-09-27 DIAGNOSIS — K029 Dental caries, unspecified: Secondary | ICD-10-CM | POA: Insufficient documentation

## 2012-09-27 MED ORDER — PENICILLIN V POTASSIUM 500 MG PO TABS: 500.0000 mg | ORAL_TABLET | Freq: Three times a day (TID) | ORAL | Status: DC

## 2012-09-27 MED ORDER — HYDROCODONE-ACETAMINOPHEN 5-325 MG PO TABS
1.0000 | ORAL_TABLET | Freq: Once | ORAL | Status: AC
  Administered 2012-09-27: 1 via ORAL
  Filled 2012-09-27: qty 1

## 2012-09-27 MED ORDER — HYDROCODONE-ACETAMINOPHEN 5-325 MG PO TABS: 2.0000 | ORAL_TABLET | ORAL | Status: DC | PRN

## 2012-09-27 NOTE — ED Provider Notes (Signed)
CSN: [id number]     Arrival date & time 09/27/12  1516 History  This chart was scribed for non-physician practitioner, Fayrene Helper, PA-C working with Claudean Kinds, MD by Greggory Stallion, ED scribe. This patient was seen in room WTR5/WTR5 and the patient's care was started at 3:48 PM.   Chief Complaint  Patient presents with  . Dental Pain   Patient is a 62 y.o. male presenting with tooth pain. The history is provided by the patient. No language interpreter was used.  Dental Pain Location:  Upper Upper teeth location:  9/LU central incisor Quality:  Sharp and throbbing Severity:  Moderate Onset quality:  Gradual Duration:  1 day Timing:  Constant Progression:  Unchanged Chronicity:  Recurrent Context: not abscess   Relieved by:  Nothing Exacerbated by: eating. Ineffective treatments: gargling with peroxide. Associated symptoms: no fever   Risk factors: smoking     HPI Comments: Kevin Maxwell is a 62 y.o. male who presents to the Emergency Department complaining of gradual onset, constant sharp, throbbing upper left dental pain that radiates to his scalp that started 1 day ago. He states he has had intermittent dental pain for a while now. Pt states he has tried gargling with peroxide with no relief. He states he hasn't been able to eat due to the pain. He's just been taking in liquids. Pt denies fever and rash as associated symptoms. He states he smokes cigarettes every now and then.  Past Medical History  Diagnosis Date  . Shoulder problem     left shoulder   Past Surgical History  Procedure Laterality Date  . Hernia repair     No family history on file. History  Substance Use Topics  . Smoking status: Current Every Day Smoker -- 0.50 packs/day    Types: Cigarettes  . Smokeless tobacco: Never Used  . Alcohol Use: Yes     Comment: quart to 1.5 after work each day of beer    Review of Systems  Constitutional: Negative for fever.  HENT: Positive for dental problem.    Skin: Negative for rash.  All other systems reviewed and are negative.    Allergies  Aspirin and Ibuprofen  Home Medications   Current Outpatient Rx  Name  Route  Sig  Dispense  Refill  . HYDROcodone-acetaminophen (NORCO/VICODIN) 5-325 MG per tablet   Oral   Take 2 tablets by mouth every 4 (four) hours as needed for pain.   10 tablet   0   . traMADol (ULTRAM) 50 MG tablet   Oral   Take 1 tablet (50 mg total) by mouth every 6 (six) hours as needed for pain.   15 tablet   0    BP 134/93  Pulse 92  Temp(Src) 98.2 F (36.8 C) (Oral)  Resp 16  SpO2 99%  Physical Exam  Nursing note and vitals reviewed. Constitutional: He is oriented to person, place, and time. He appears well-developed and well-nourished. No distress.  HENT:  Head: Normocephalic and atraumatic.  Left upper central incisor with marked decay. No abscess or gingivitis noted.   Eyes: EOM are normal.  Neck: Neck supple. No tracheal deviation present.  Cardiovascular: Normal rate.   Pulmonary/Chest: Effort normal. No respiratory distress.  Musculoskeletal: Normal range of motion.  Neurological: He is alert and oriented to person, place, and time.  Skin: Skin is warm and dry.  Psychiatric: He has a normal mood and affect. His behavior is normal.    ED Course   Procedures (  including critical care time)  DIAGNOSTIC STUDIES: Oxygen Saturation is 99% on RA, normal by my interpretation.    COORDINATION OF CARE: 3:54 PM-Discussed treatment plan which includes pain medication with pt at bedside and pt agreed to plan. Advised pt to follow up with dentist.   Labs Reviewed - No data to display No results found. 1. Dental decay     MDM  BP 134/93  Pulse 92  Temp(Src) 98.2 F (36.8 C) (Oral)  Resp 16  SpO2 99%    I personally performed the services described in this documentation, which was scribed in my presence. The recorded information has been reviewed and is accurate.    Fayrene Helper,  PA-C 09/27/12 1559

## 2012-09-27 NOTE — ED Notes (Signed)
Pt c/o l side dental pain x1 day.

## 2012-09-28 ENCOUNTER — Ambulatory Visit: Payer: Self-pay

## 2012-09-28 NOTE — ED Provider Notes (Signed)
Medical screening examination/treatment/procedure(s) were performed by non-physician practitioner and as supervising physician I was immediately available for consultation/collaboration.   Seamus Warehime Joseph Kongmeng Santoro, MD 09/28/12 1645 

## 2012-09-30 ENCOUNTER — Ambulatory Visit: Payer: Self-pay | Attending: Family Medicine | Admitting: Internal Medicine

## 2012-09-30 VITALS — BP 148/91 | HR 67 | Temp 98.9°F | Resp 16 | Ht 69.0 in | Wt 171.0 lb

## 2012-09-30 DIAGNOSIS — R109 Unspecified abdominal pain: Secondary | ICD-10-CM | POA: Insufficient documentation

## 2012-09-30 DIAGNOSIS — R319 Hematuria, unspecified: Secondary | ICD-10-CM | POA: Insufficient documentation

## 2012-09-30 DIAGNOSIS — R1031 Right lower quadrant pain: Secondary | ICD-10-CM

## 2012-09-30 DIAGNOSIS — N508 Other specified disorders of male genital organs: Secondary | ICD-10-CM

## 2012-09-30 DIAGNOSIS — N503 Cyst of epididymis: Secondary | ICD-10-CM | POA: Insufficient documentation

## 2012-09-30 LAB — PSA: PSA: 0.86 ng/mL (ref <=4.00)

## 2012-09-30 NOTE — Progress Notes (Signed)
Patient ID: Kevin Maxwell, male   DOB: [birth year], 62 y.o.   MRN: [id number] Patient Demographics  Kevin Maxwell, is a 62 y.o. male  Kevin Maxwell:[id number]  Kevin Maxwell:[id number]  DOB - 12/06/1950  Chief Complaint  Patient presents with  . Establish Care  . Dysuria        Subjective:   Kevin Maxwell today is here to establish primary care.  Patient has no medical problems. He has ongoing right groin and inguinal pain, radiating to right testicles. Patient reports that this is ongoing for one year. 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 However he has been having right inguinal pain for which he was seen in the urgent care. He also notices hematuria off and on. He denies any anorexia or weight loss, fevers. He does have dysuria off and on. Patient was recommended a urology followup however he did not have insurance. Patient has No headache, No chest pain, No abdominal pain - No Nausea, No new weakness tingling or numbness, No Cough - SOB.  Objective:    Filed Vitals:   09/30/12 0937  BP: 148/91  Pulse: 67  Temp: 98.9 F (37.2 C)  TempSrc: Oral  Resp: 16  Height: 5\' 9"  (1.753 m)  Weight: 171 lb (77.565 kg)  SpO2: 98%     ALLERGIES:   Allergies  Allergen Reactions  . Aspirin Nausea And Vomiting  . Ibuprofen Nausea And Vomiting    PAST MEDICAL HISTORY: Past Medical History  Diagnosis Date  . Shoulder problem     left shoulder    PAST SURGICAL HISTORY: Past Surgical History  Procedure Laterality Date  . Hernia repair      FAMILY HISTORY: History reviewed. No pertinent family history.  MEDICATIONS AT HOME: Prior to Admission medications   Medication Sig Start Date End Date Taking? Authorizing Provider  HYDROcodone-acetaminophen (NORCO/VICODIN) 5-325 MG per tablet Take 2 tablets by mouth every 4 (four) hours as needed for pain. 09/27/12   Fayrene Helper, PA-C  penicillin v potassium (VEETID) 500 MG tablet  Take 1 tablet (500 mg total) by mouth 3 (three) times daily. 09/27/12   Fayrene Helper, PA-C    REVIEW OF SYSTEMS:  Constitutional:   No   Fevers, chills, fatigue.  HEENT:    No headaches, Sore throat,   Cardio-vascular: No chest pain,  Orthopnea, swelling in lower extremities, anasarca, palpitations  GI:  No abdominal pain, nausea, vomiting, diarrhea  Resp: No shortness of breath,  No coughing up of blood.No cough.No wheezing.  Skin:  no rash or lesions.  GU:  Please see history of present illness  Musculoskeletal: No joint pain or swelling.  No decreased range of motion.  No back pain.  Psych: No change in mood or affect. No depression or anxiety.  No memory loss.   Exam  General appearance :Awake, alert, NAD, Speech Clear. HEENT: Atraumatic and Normocephalic, PERLA Neck: supple, no JVD. No cervical lymphadenopathy.  Chest: clear to auscultation bilaterally, no wheezing, rales or rhonchi CVS: S1 S2 regular, no murmurs.  Abdomen: soft, NBS, NT, ND, no gaurding, rigidity or rebound. Extremities: No cyanosis, clubbing, B/L Lower Ext shows no edema,  Neurology: Awake alert, and oriented X 3, CN II-XII intact, Non focal Skin:No Rash or lesions Wounds: N/A    Data Review   Basic Metabolic Panel: No results found for this basename: NA, K, CL, CO2, GLUCOSE, BUN, CREATININE, CALCIUM, MG, PHOS,  in the last  168 hours Liver Function Tests: No results found for this basename: AST, ALT, ALKPHOS, BILITOT, PROT, ALBUMIN,  in the last 168 hours  CBC: No results found for this basename: WBC, NEUTROABS, HGB, HCT, MCV, PLT,  in the last 168 hours ------------------------------------------------------------------------------------------------------------------ No results found for this basename: HGBA1C,  in the last 72 hours ------------------------------------------------------------------------------------------------------------------ No results found for this basename: CHOL,  HDL, LDLCALC, TRIG, CHOLHDL, LDLDIRECT,  in the last 72 hours ------------------------------------------------------------------------------------------------------------------ No results found for this basename: TSH, T4TOTAL, FREET3, T3FREE, THYROIDAB,  in the last 72 hours ------------------------------------------------------------------------------------------------------------------ No results found for this basename: VITAMINB12, FOLATE, FERRITIN, TIBC, IRON, RETICCTPCT,  in the last 72 hours  Coagulation profile  No results found for this basename: INR, PROTIME,  in the last 168 hours    Assessment & Plan   Active Problems: Right groin pain - Likely secondary to right epidermal cyst versus spermatocele recently diagnosed. He had scrotal ultrasound in March 2014 which showed no testicular mass or portion but less than 1 cm right epididymal cyst or spermatocele.  - Doppler ultrasound of the testes showed low resistance flow bilaterally - Patient reports improvement with Vicodin and is currently on penicillin that was prescribed to him at the urgent care - He had CT abdomen and pelvis done 3/14 showed no evidence of inguinal hernia or mass or acute findings. Mildly enlarged prostrate - Will check UA, PSA - Ambulatory referral to urology sent  Follow-up in 3 months as needed   RAI,RIPUDEEP M.D. 09/30/2012, 10:16 AM

## 2012-09-30 NOTE — Patient Instructions (Addendum)
Hematuria, Adult Hematuria (blood in your urine) can be caused by a bladder infection (cystitis), kidney infection (pyelonephritis), prostate infection (prostatitis), or kidney stone. Infections will usually respond to antibiotics (medications which kill germs), and a kidney stone will usually pass through your urine without further treatment. If you were put on antibiotics, take all the medicine until gone. You may feel better in a few days, but take all of your medicine or the infection may not respond and become more difficult to treat. If antibiotics were not given, an infection did not cause the blood in the urine. A further work up to find out the reason may be needed. HOME CARE INSTRUCTIONS   Drink lots of fluid, 3 to 4 quarts a day. If you have been diagnosed with an infection, cranberry juice is especially recommended, in addition to large amounts of water.  Avoid caffeine, tea, and carbonated beverages, because they tend to irritate the bladder.  Avoid alcohol as it may irritate the prostate.  Only take over-the-counter or prescription medicines for pain, discomfort, or fever as directed by your caregiver.  If you have been diagnosed with a kidney stone follow your caregivers instructions regarding straining your urine to catch the stone. TO PREVENT FURTHER INFECTIONS:  Empty the bladder often. Avoid holding urine for long periods of time.  After a bowel movement, women should cleanse front to back. Use each tissue only once.  Empty the bladder before and after sexual intercourse if you are a male.  Return to your caregiver if you develop back pain, fever, nausea (feeling sick to your stomach), vomiting, or your symptoms (problems) are not better in 3 days. Return sooner if you are getting worse. If you have been requested to return for further testing make sure to keep your appointments. If an infection is not the cause of blood in your urine, X-rays may be required. Your caregiver  will discuss this with you. SEEK IMMEDIATE MEDICAL CARE IF:   You have a persistent fever over 102 F (38.9 C).  You develop severe vomiting and are unable to keep the medication down.  You develop severe back or abdominal pain despite taking your medications.  You begin passing a large amount of blood or clots in your urine.  You feel extremely weak or faint, or pass out. MAKE SURE YOU:   Understand these instructions.  Will watch your condition.  Will get help right away if you are not doing well or get worse. Document Released: 02/10/2005 Document Revised: 05/05/2011 Document Reviewed: 09/30/2007 [employer] Patient Information 2014 [employer].  
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
## 2012-10-01 LAB — URINALYSIS
Bilirubin Urine: NEGATIVE
Ketones, ur: NEGATIVE mg/dL
Specific Gravity, Urine: 1.017 (ref 1.005–1.030)
pH: 6 (ref 5.0–8.0)

## 2012-11-25 ENCOUNTER — Encounter (HOSPITAL_COMMUNITY): Payer: Self-pay | Admitting: Emergency Medicine

## 2012-11-25 ENCOUNTER — Emergency Department (HOSPITAL_COMMUNITY): Payer: Self-pay

## 2012-11-25 ENCOUNTER — Emergency Department (HOSPITAL_COMMUNITY)
Admission: EM | Admit: 2012-11-25 | Discharge: 2012-11-25 | Disposition: A | Discharge status: Home / Self Care | Payer: Self-pay | Attending: Emergency Medicine | Admitting: Emergency Medicine

## 2012-11-25 DIAGNOSIS — Y929 Unspecified place or not applicable: Secondary | ICD-10-CM | POA: Insufficient documentation

## 2012-11-25 DIAGNOSIS — Z792 Long term (current) use of antibiotics: Secondary | ICD-10-CM | POA: Insufficient documentation

## 2012-11-25 DIAGNOSIS — S82899A Other fracture of unspecified lower leg, initial encounter for closed fracture: Secondary | ICD-10-CM | POA: Insufficient documentation

## 2012-11-25 DIAGNOSIS — F17200 Nicotine dependence, unspecified, uncomplicated: Secondary | ICD-10-CM | POA: Insufficient documentation

## 2012-11-25 DIAGNOSIS — Z8739 Personal history of other diseases of the musculoskeletal system and connective tissue: Secondary | ICD-10-CM | POA: Insufficient documentation

## 2012-11-25 DIAGNOSIS — S82839A Other fracture of upper and lower end of unspecified fibula, initial encounter for closed fracture: Secondary | ICD-10-CM

## 2012-11-25 DIAGNOSIS — Y9389 Activity, other specified: Secondary | ICD-10-CM | POA: Insufficient documentation
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
## 2012-11-25 MED ORDER — OXYCODONE-ACETAMINOPHEN 5-325 MG PO TABS: 2.0000 | ORAL_TABLET | Freq: Four times a day (QID) | ORAL | Status: DC | PRN

## 2012-11-25 NOTE — ED Provider Notes (Signed)
Medical screening examination/treatment/procedure(s) were performed by non-physician practitioner and as supervising physician I was immediately available for consultation/collaboration.  Devoria Albe, MD, FACEP  xrays reviewed with me  Ward Givens, MD 11/25/12 [phone]
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
## 2013-01-21 ENCOUNTER — Emergency Department (HOSPITAL_COMMUNITY): Payer: Self-pay

## 2013-01-21 ENCOUNTER — Emergency Department (HOSPITAL_COMMUNITY)
Admission: EM | Admit: 2013-01-21 | Discharge: 2013-01-21 | Disposition: A | Discharge status: Home / Self Care | Payer: Self-pay | Attending: Emergency Medicine | Admitting: Emergency Medicine

## 2013-01-21 DIAGNOSIS — Y9301 Activity, walking, marching and hiking: Secondary | ICD-10-CM | POA: Insufficient documentation

## 2013-01-21 DIAGNOSIS — Y9289 Other specified places as the place of occurrence of the external cause: Secondary | ICD-10-CM | POA: Insufficient documentation

## 2013-01-21 DIAGNOSIS — S93409A Sprain of unspecified ligament of unspecified ankle, initial encounter: Secondary | ICD-10-CM | POA: Insufficient documentation

## 2013-01-21 DIAGNOSIS — Z8739 Personal history of other diseases of the musculoskeletal system and connective tissue: Secondary | ICD-10-CM | POA: Insufficient documentation

## 2013-01-21 DIAGNOSIS — F17200 Nicotine dependence, unspecified, uncomplicated: Secondary | ICD-10-CM | POA: Insufficient documentation

## 2013-01-21 DIAGNOSIS — X500XXA Overexertion from strenuous movement or load, initial encounter: Secondary | ICD-10-CM | POA: Insufficient documentation

## 2013-01-21 DIAGNOSIS — Z4789 Encounter for other orthopedic aftercare: Secondary | ICD-10-CM | POA: Insufficient documentation

## 2013-01-21 IMAGING — CR DG ANKLE COMPLETE 3+V*L*
3 series · 3 of 3 positions shown · non-contrast
Comparison: [DATE].

CLINICAL DATA: Known left ankle fracture. Repeat injury today
complaining of lateral ankle pain and swelling.

EXAM:
LEFT ANKLE COMPLETE - 3+ VIEW

[x ankle ap left]
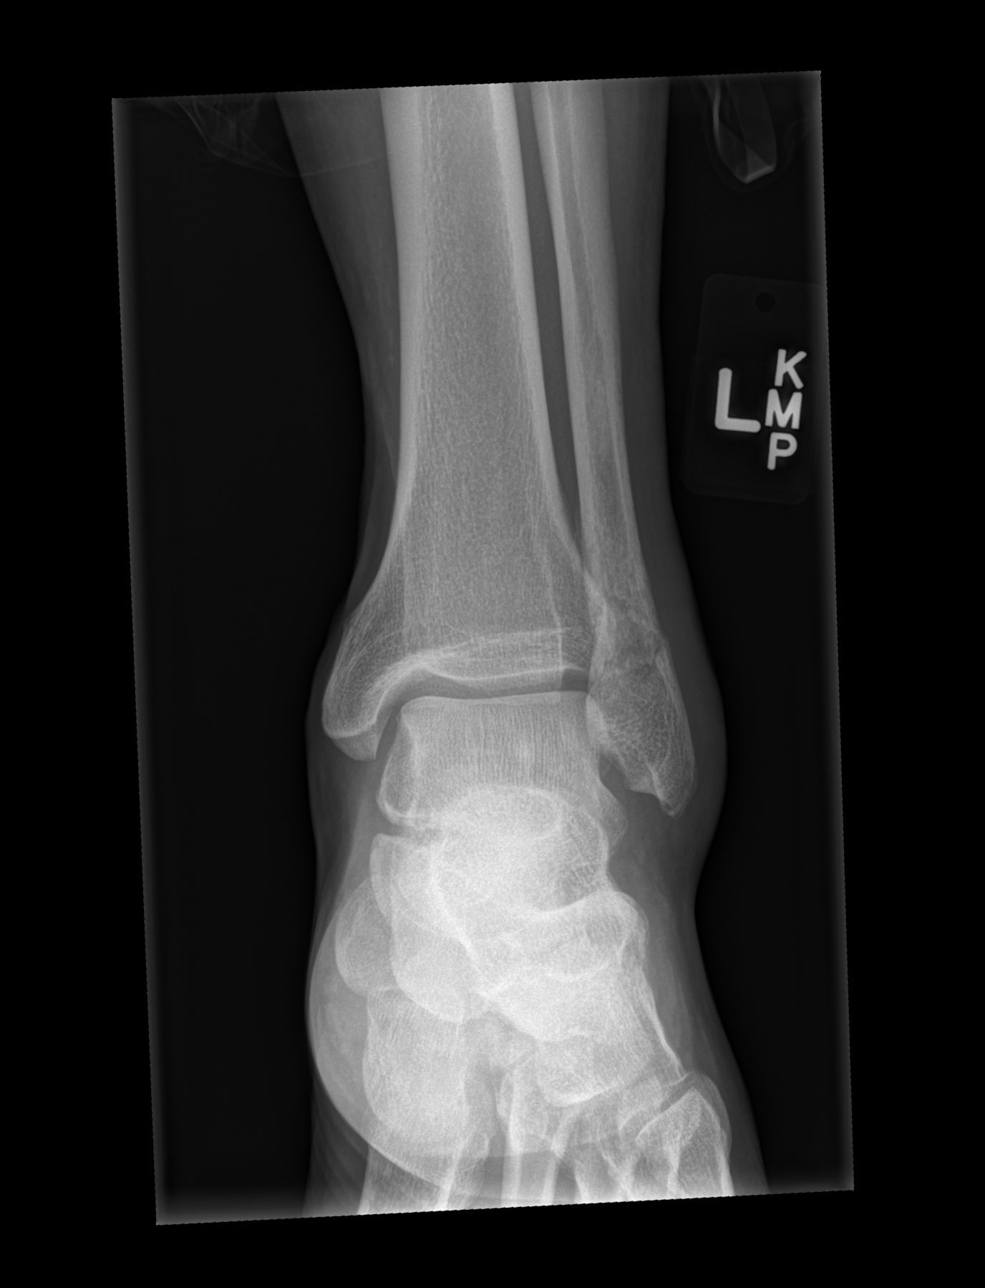

[x ankle obl left]
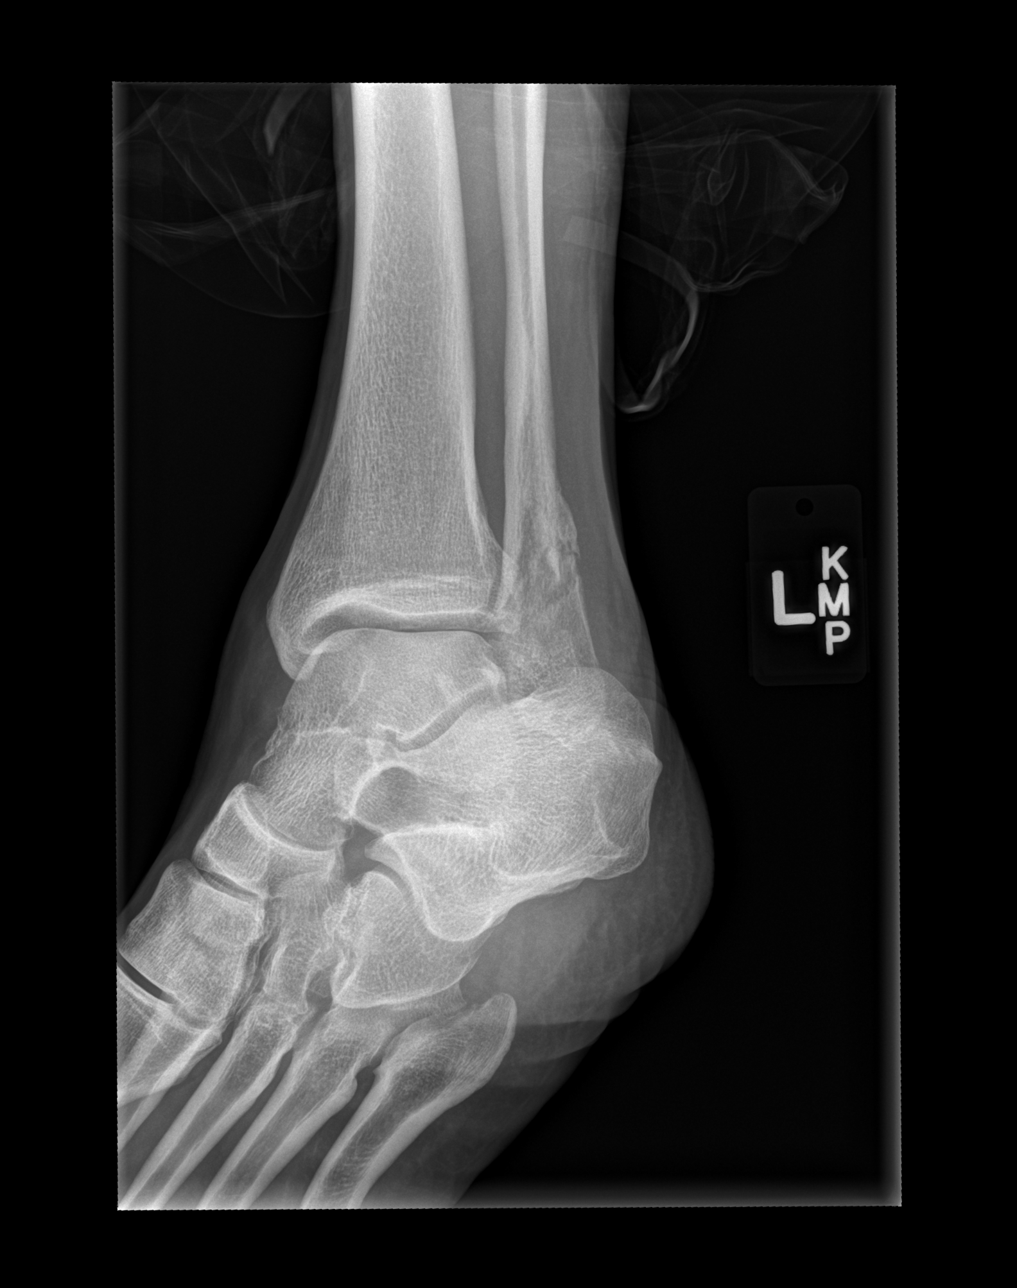

[x ankle lat left]
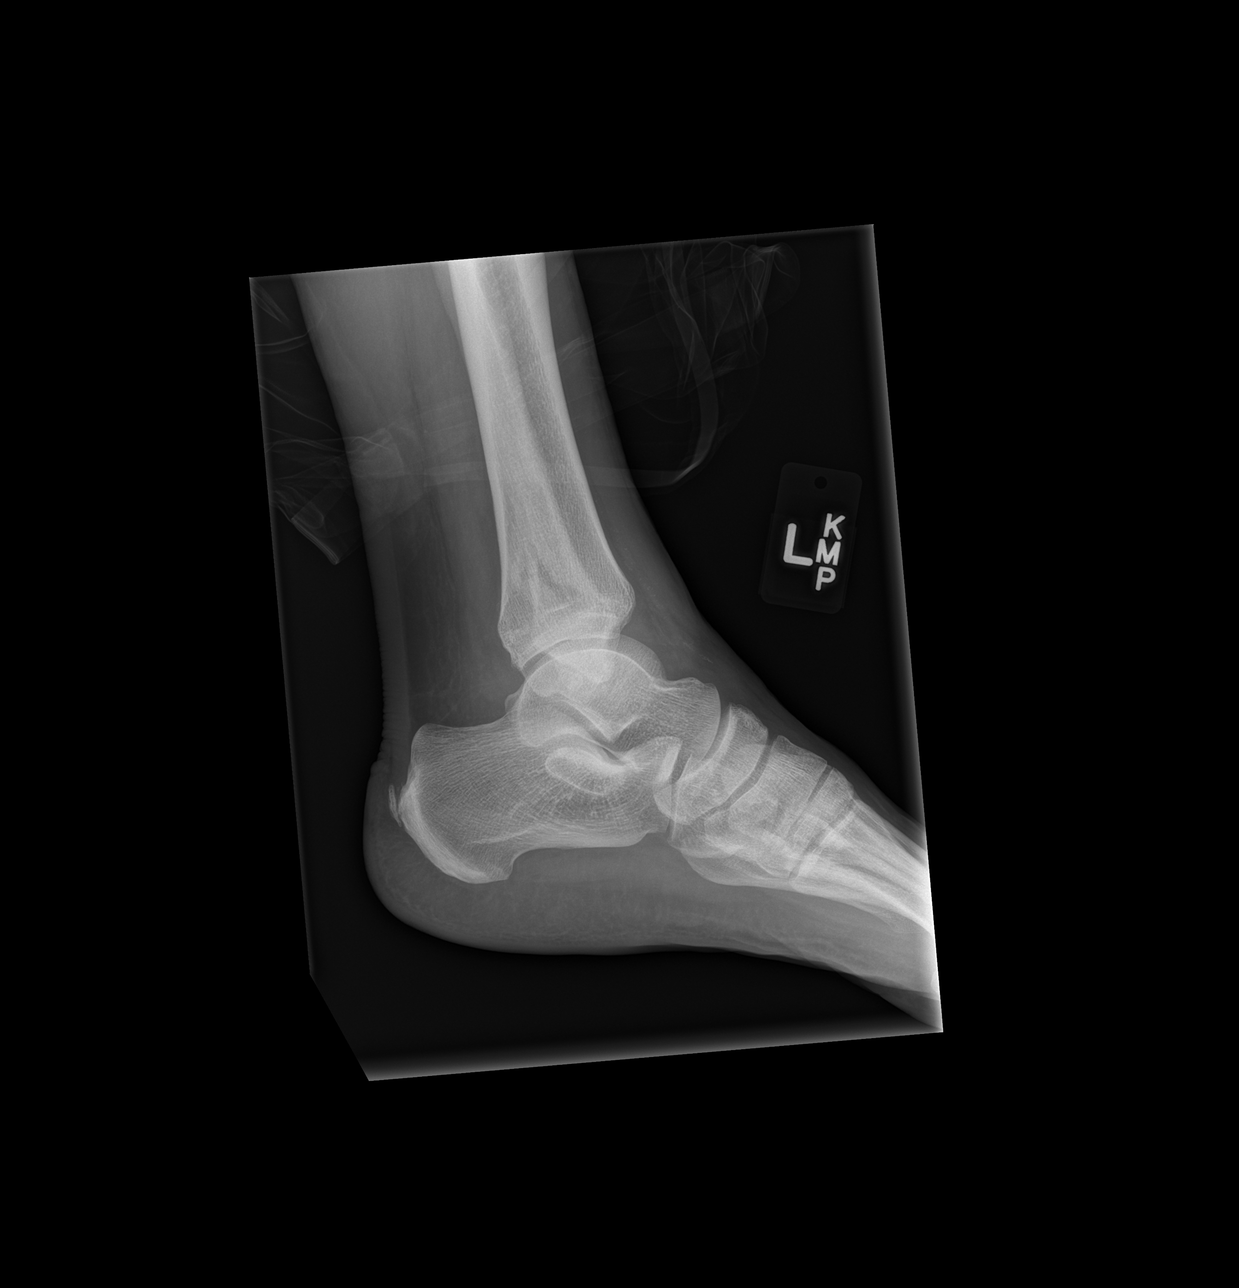

[3 of 3 positions shown; findings below may reference images not displayed]

FINDINGS: Previously noted spiral fracture through the lateral malleolus
appears less distinct than the prior study, with some callus
formation on today's examination, compatible with a healing injury.
No new acute displaced fracture, subluxation or dislocation is
noted. Small amount of soft tissue swelling overlying the lateral
malleolus (significantly decreased compared to the prior study).
Small dorsal calcaneal enthesophyte incidentally noted.
IMPRESSION: 1. Healing fracture of the lateral malleolus. No definite new acute
injury.

## 2013-01-21 MED ORDER — HYDROCODONE-ACETAMINOPHEN 5-325 MG PO TABS
1.0000 | ORAL_TABLET | Freq: Once | ORAL | Status: AC
  Administered 2013-01-21: 1 via ORAL
  Filled 2013-01-21: qty 1

## 2013-01-21 MED ORDER — HYDROCODONE-ACETAMINOPHEN 5-325 MG PO TABS: 1.0000 | ORAL_TABLET | Freq: Four times a day (QID) | ORAL | Status: DC | PRN

## 2013-01-21 NOTE — ED Provider Notes (Signed)
CSN: [id number]     Arrival date & time 01/21/13  1740 History  This chart was scribed for non-physician practitioner Jaynie Crumble, PA-C, working with Juliet Rude. Rubin Payor, MD by Dorothey Baseman, ED Scribe. This patient was seen in room WTR8/WTR8 and the patient's care was started at 5:48 PM.    Chief Complaint  Patient presents with  . Ankle Injury   The history is provided by the patient. No language interpreter was used.   HPI Comments: Kevin Maxwell is a 62 y.o. male who presents to the Emergency Department complaining of a constant, shooting pain to the left ankle onset tonight when he states that he stepped into a hole and the ankle twisted. He reports associated swelling to the area. Patient reports a history of fracture to the same ankle about 2  months ago that was well-healed. States current pain is worsened with pressure and movement of the ankle. Reports nothing making pain better. Pain does not radiate.   Past Medical History  Diagnosis Date  . Shoulder problem     left shoulder   Past Surgical History  Procedure Laterality Date  . Hernia repair     No family history on file. History  Substance Use Topics  . Smoking status: Current Every Day Smoker -- 0.50 packs/day    Types: Cigarettes  . Smokeless tobacco: Never Used  . Alcohol Use: Yes     Comment: quart to 1.5 after work each day of beer    Review of Systems  Musculoskeletal: Positive for arthralgias, joint swelling and myalgias.    Allergies  Aspirin and Ibuprofen  Home Medications   Current Outpatient Rx  Name  Route  Sig  Dispense  Refill  . oxyCODONE-acetaminophen (PERCOCET/ROXICET) 5-325 MG per tablet   Oral   Take 2 tablets by mouth every 6 (six) hours as needed for pain.   15 tablet   0    Triage Vitals: BP 153/96  Pulse 90  Temp(Src) 98.9 F (37.2 C)  Resp 16  SpO2 97%  Physical Exam  Nursing note and vitals reviewed. Constitutional: He is oriented to person, place, and time. He appears  well-developed and well-nourished. No distress.  HENT:  Head: Normocephalic and atraumatic.  Eyes: Conjunctivae are normal.  Neck: Normal range of motion. Neck supple.  Pulmonary/Chest: Effort normal. No respiratory distress.  Abdominal: He exhibits no distension.  Musculoskeletal: Normal range of motion. He exhibits edema and tenderness.  Tenderness to palpation to the lateral left malleolus. No tenderness to the medial left malleolus. Swelling of the ankle joint. No tenderness over the left knee. No tenderness to the left foot. Good DP pulse on the left. Pain with dorsiflexion and plantarflexion of the left ankle.   Neurological: He is alert and oriented to person, place, and time.  Skin: Skin is warm and dry.  Psychiatric: He has a normal mood and affect. His behavior is normal.    ED Course  Procedures (including critical care time)  DIAGNOSTIC STUDIES: Oxygen Saturation is 97% on room air, normal by my interpretation.    COORDINATION OF CARE: 5:52 PM- Will order an x-ray of the left ankle. Discussed treatment plan with patient at bedside and patient verbalized agreement.     Labs Review Labs Reviewed - No data to display Imaging Review Dg Ankle Complete Left  01/21/2013   CLINICAL DATA:  Known left ankle fracture. Repeat injury today complaining of lateral ankle pain and swelling.  EXAM: LEFT ANKLE COMPLETE - 3+ VIEW  COMPARISON:  11/25/2012.  FINDINGS: Previously noted spiral fracture through the lateral malleolus appears less distinct than the prior study, with some callus formation on today's examination, compatible with a healing injury. No new acute displaced fracture, subluxation or dislocation is noted. Small amount of soft tissue swelling overlying the lateral malleolus (significantly decreased compared to the prior study). Small dorsal calcaneal enthesophyte incidentally noted.  IMPRESSION: 1. Healing fracture of the lateral malleolus. No definite new acute injury.    Electronically Signed   By: Trudie Reed M.D.   On: 01/21/2013 18:13    EKG Interpretation   None       MDM   1. Ankle sprain and strain, left, initial encounter      Pt with pain to the left ankle after stepping into a hole while going leave [place] tonight. Patient has history of recent left ankle fracture, 2 months ago. States ankle is still healing. States pain increased and injuring it tonight. X-ray obtained and is negative. I have ordered patient an ASO brace. Home with orthopedics followup. Norco given for pain emergency department, and discharge home with 10 tablets.  Filed Vitals:   01/21/13 1751  BP: 153/96  Pulse: 90  Temp: 98.9 F (37.2 C)  Resp: 16  SpO2: 97%    I personally performed the services described in this documentation, which was scribed in my presence. The recorded information has been reviewed and is accurate.     Lottie Mussel, PA-C 01/21/13 1852  Lottie Mussel, PA-C 01/21/13 1855
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
## 2013-01-21 NOTE — ED Provider Notes (Signed)
Medical screening examination/treatment/procedure(s) were performed by non-physician practitioner and as supervising physician I was immediately available for consultation/collaboration.  EKG Interpretation   None        [name] R. Talma Aguillard, MD 01/21/13 2323 

## 2013-05-19 ENCOUNTER — Ambulatory Visit: Payer: No Typology Code available for payment source | Attending: Internal Medicine

## 2013-07-20 ENCOUNTER — Emergency Department (HOSPITAL_COMMUNITY)
Admission: EM | Admit: 2013-07-20 | Discharge: 2013-07-20 | Disposition: A | Discharge status: Home / Self Care | Payer: No Typology Code available for payment source | Attending: Emergency Medicine | Admitting: Emergency Medicine

## 2013-07-20 ENCOUNTER — Encounter (HOSPITAL_COMMUNITY): Payer: Self-pay | Admitting: Emergency Medicine

## 2013-07-20 ENCOUNTER — Emergency Department (HOSPITAL_COMMUNITY): Payer: No Typology Code available for payment source

## 2013-07-20 DIAGNOSIS — S63609A Unspecified sprain of unspecified thumb, initial encounter: Secondary | ICD-10-CM

## 2013-07-20 DIAGNOSIS — Y9389 Activity, other specified: Secondary | ICD-10-CM | POA: Insufficient documentation

## 2013-07-20 DIAGNOSIS — S6390XA Sprain of unspecified part of unspecified wrist and hand, initial encounter: Secondary | ICD-10-CM | POA: Insufficient documentation

## 2013-07-20 DIAGNOSIS — X500XXA Overexertion from strenuous movement or load, initial encounter: Secondary | ICD-10-CM | POA: Insufficient documentation

## 2013-07-20 DIAGNOSIS — Y9289 Other specified places as the place of occurrence of the external cause: Secondary | ICD-10-CM | POA: Insufficient documentation

## 2013-07-20 DIAGNOSIS — M6530 Trigger finger, unspecified finger: Secondary | ICD-10-CM | POA: Insufficient documentation

## 2013-07-20 DIAGNOSIS — F17200 Nicotine dependence, unspecified, uncomplicated: Secondary | ICD-10-CM | POA: Insufficient documentation

## 2013-07-20 IMAGING — CR DG FINGER THUMB 2+V*R*
3 series · 3 of 3 positions shown · non-contrast
Comparison: None.

CLINICAL DATA: Swelling and pain

EXAM:
RIGHT THUMB 2+V

[x finger obl right]
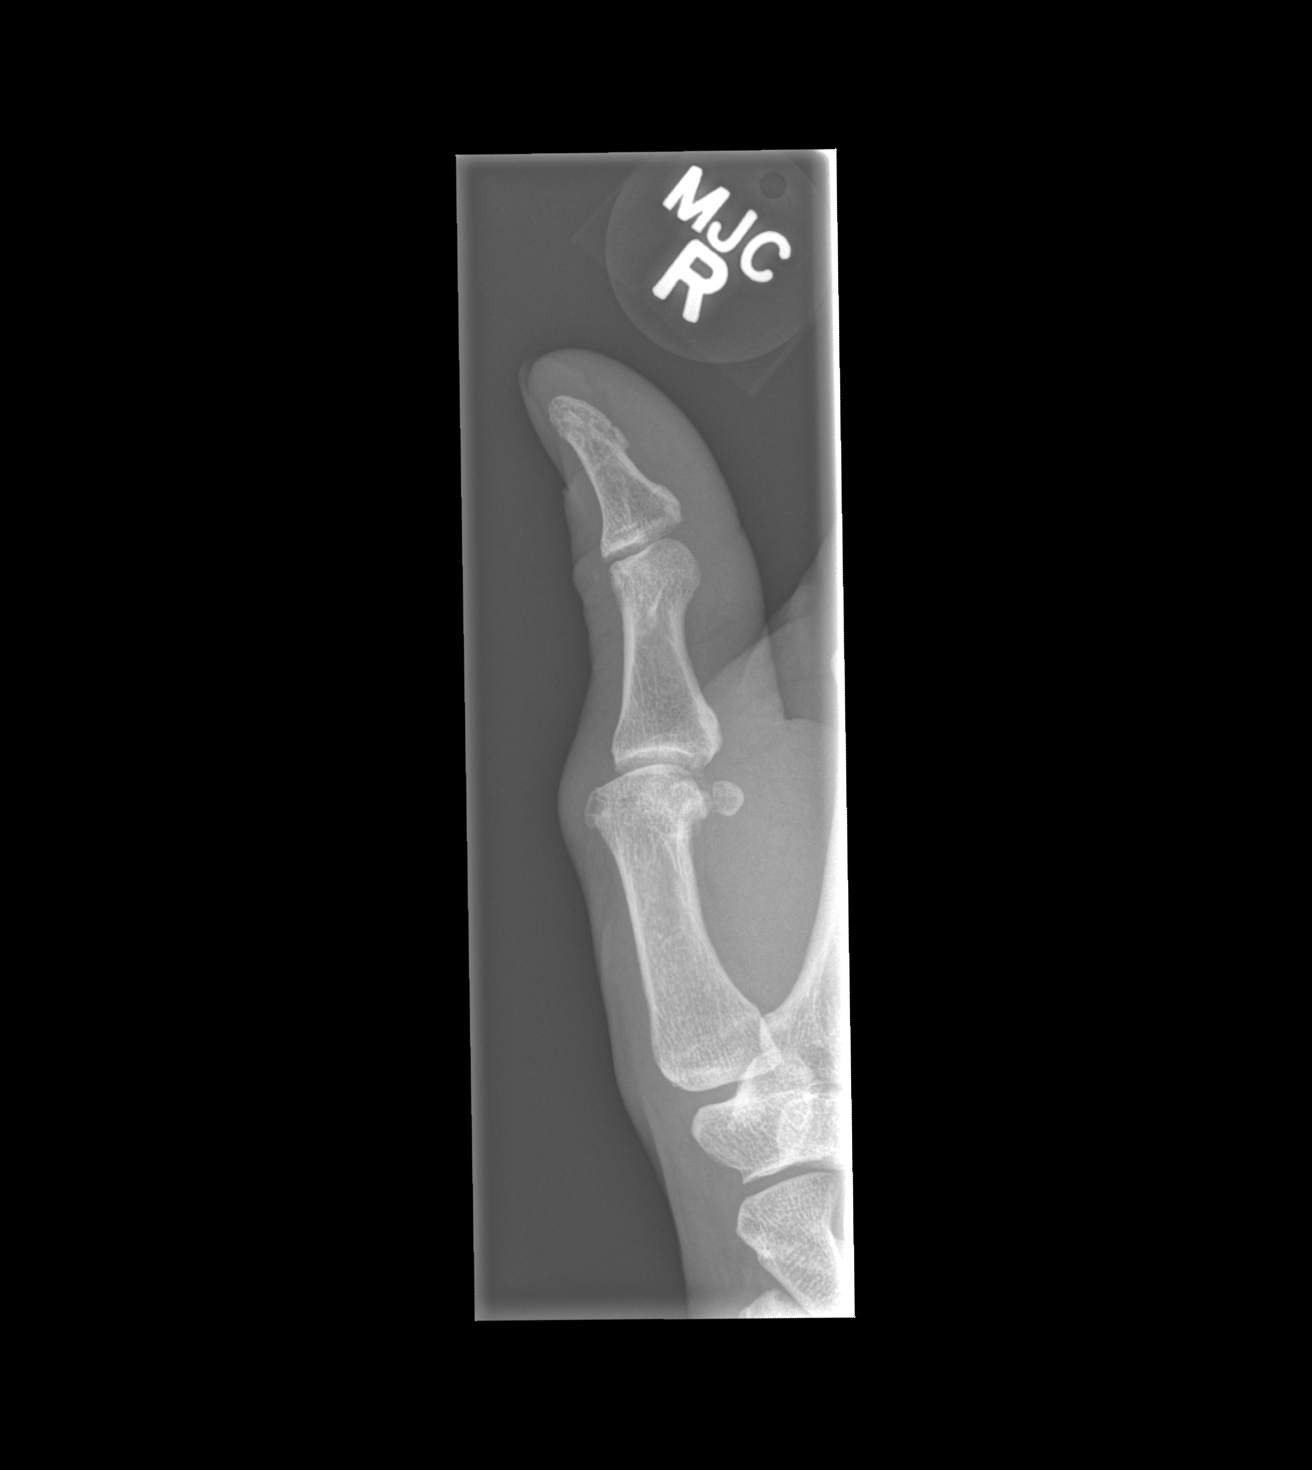

[x finger pa right]
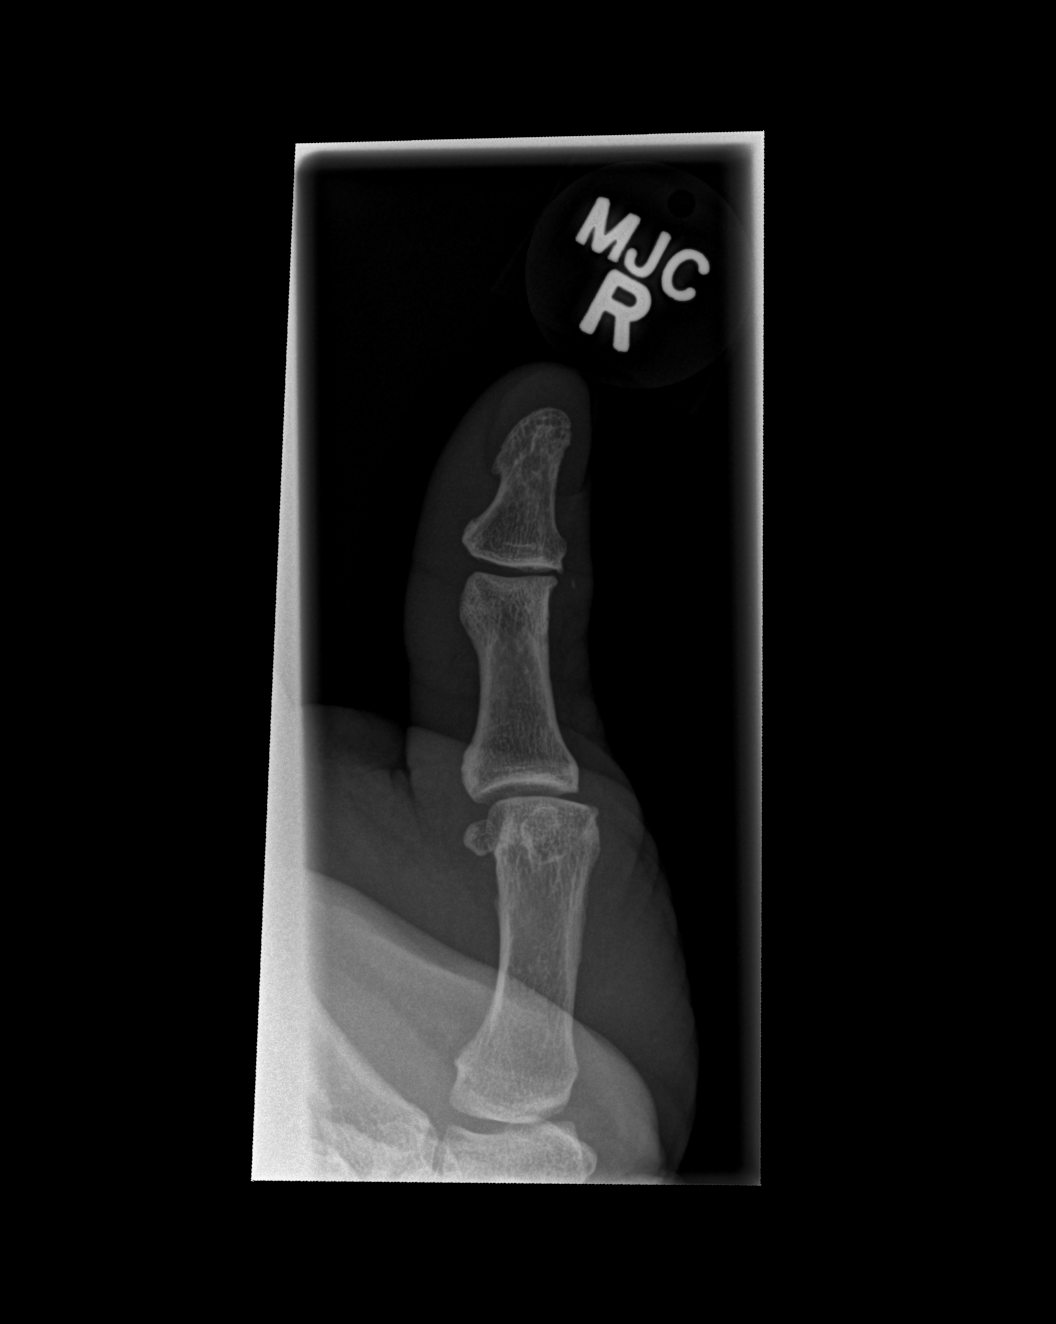

[x finger lat right]
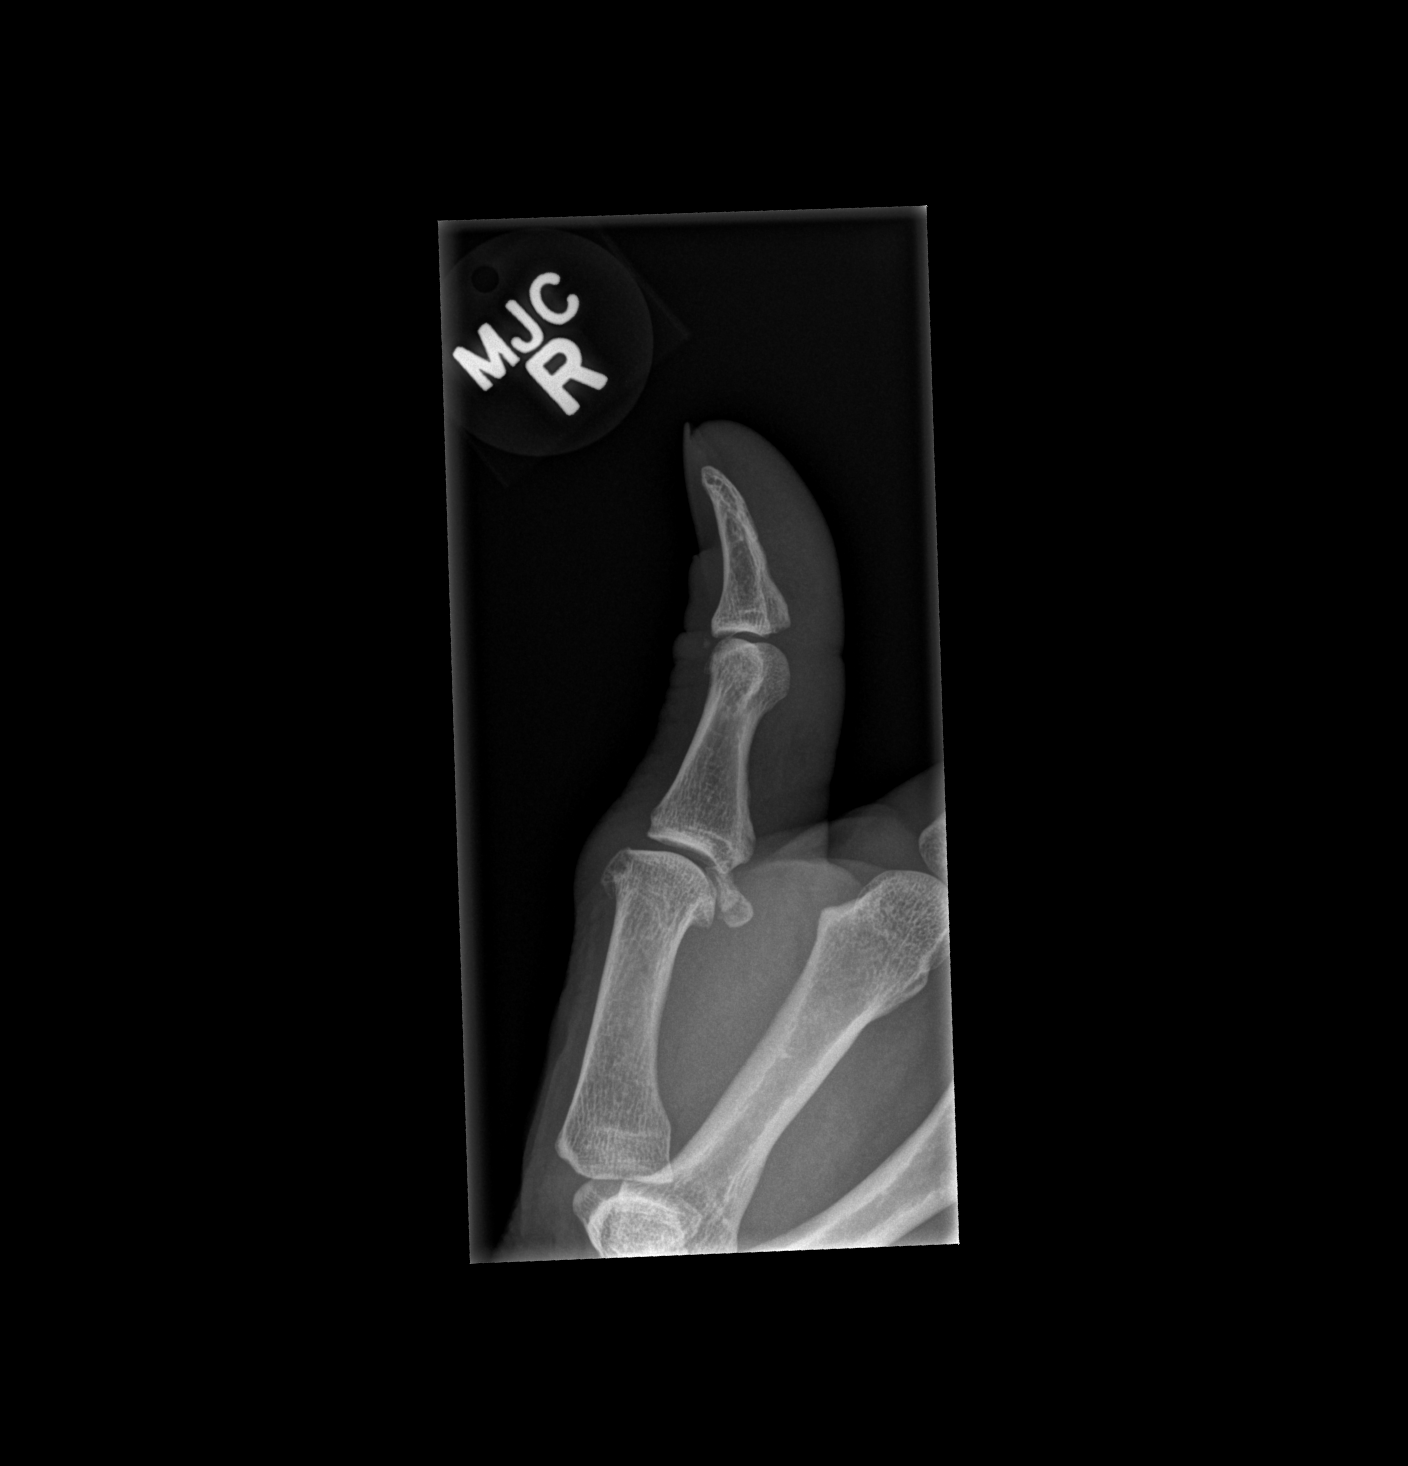

[3 of 3 positions shown; findings below may reference images not displayed]

FINDINGS: There is no evidence of fracture or dislocation. There is no
evidence of arthropathy or other focal bone abnormality. Soft
tissues are unremarkable There is a 1 mm linear calcific density
projecting posterior to the interphalangeal joint.
IMPRESSION: No acute abnormality

## 2013-07-20 MED ORDER — HYDROCODONE-ACETAMINOPHEN 5-325 MG PO TABS: 1.0000 | ORAL_TABLET | Freq: Four times a day (QID) | ORAL | Status: DC | PRN

## 2013-07-20 NOTE — ED Notes (Signed)
Ortho tech called for application of wrist splint.

## 2013-07-20 NOTE — ED Notes (Signed)
Pt c/o right thimb pais, swelling, and locking/popping out of place for 2-3 weeks. Pt believes when he was mowing he bent his thumb backwards.  Pt denies having PMH gout.

## 2013-07-20 NOTE — ED Provider Notes (Signed)
CSN: [id number]     Arrival date & time 07/20/13  1818 History   First MD Initiated Contact with Patient 07/20/13 2014 This chart was scribed for non-physician practitioner Roxy Horsemanobert Loveta Dellis, PA-C working with Shon Batonourtney F Horton, MD by Valera CastleSteven Perry, ED scribe. This patient was seen in room WTR8/WTR8 and the patient's care was started at 8:29 PM.     Chief Complaint  Patient presents with  . swollen thumb     right   (Consider location/radiation/quality/duration/timing/severity/associated sxs/prior Treatment) The history is provided by the patient. No language interpreter was used.   HPI Comments: Kevin Maxwell is a 63 y.o. male who presents to the Emergency Department with chief complaint of constant, throbbing right thumb pain and swelling, with frequent locking of the joint and dislocation, onset 2-3 weeks ago. He reports when he bends his thumb down he feels the joint pop out and when he bends it back up he feels it pop quickly back in place, much like a rubber band. He reports a sharp pain with this movement, that radiates down into his hand. He states the other day while riding in his truck he tried to pull down on his thumb to fix it resulting in a lot of pain. He reports while he is working his thumb doesn't hurt as much, but states the pain increases when he goes home to rest. He reports trouble sleeping due to his constant pain, stating he has a hard time finding a relieving position. He thinks he may have bent his thumb backwards while mowing several weeks ago. He denies any other symptoms.   PCP - Jeanann LewandowskyJEGEDE, OLUGBEMIGA, MD  Past Medical History  Diagnosis Date  . Shoulder problem     left shoulder   Past Surgical History  Procedure Laterality Date  . Hernia repair     No family history on file. History  Substance Use Topics  . Smoking status: Current Every Day Smoker -- 0.50 packs/day    Types: Cigarettes  . Smokeless tobacco: Never Used  . Alcohol Use: Yes     Comment: quart to  1.5 after work each day of beer    Review of Systems  Musculoskeletal: Positive for arthralgias (R thumb) and joint swelling (R thumb).  Skin: Negative for wound.  Psychiatric/Behavioral: Positive for sleep disturbance (secondary to thumb pain).    Allergies  Aspirin and Ibuprofen  Home Medications   Prior to Admission medications   Medication Sig Start Date End Date Taking? Authorizing Provider  HYDROcodone-acetaminophen (NORCO) 5-325 MG per tablet Take 1 tablet by mouth every 6 (six) hours as needed. 01/21/13   Tatyana A Kirichenko, PA-C   BP 143/86  Pulse 74  Temp(Src) 98.6 F (37 C) (Oral)  Resp 18  SpO2 98%  Physical Exam  Nursing note and vitals reviewed. Constitutional: He is oriented to person, place, and time. He appears well-developed and well-nourished. No distress.  HENT:  Head: Normocephalic and atraumatic.  Eyes: EOM are normal.  Neck: Neck supple. No tracheal deviation present.  Cardiovascular: Normal rate.   Pulmonary/Chest: Effort normal. No respiratory distress.  Musculoskeletal: He exhibits tenderness.  Right thumb tender to palpation at the MCP and DIP, ROM and strength limited secondary to pain, no bony abnormality or deformity, catching/snapping with extension.   Neurological: He is alert and oriented to person, place, and time.  Sensation intact.   Skin: Skin is warm and dry.  Psychiatric: He has a normal mood and affect. His behavior is normal.  ED Course  Procedures (including critical care time)  DIAGNOSTIC STUDIES: Oxygen Saturation is 98% on room air, normal by my interpretation.    COORDINATION OF CARE: 8:38 PM-Discussed suspicion of trigger finger with pt. Advised pt to ice thumb once a day at home with paper cup technique. If symptoms persist, advised pt to follow up with orthopedic specialist. Will give pt resources. Pt complies to plan.   Labs Review Labs Reviewed - No data to display  Imaging Review Dg Finger Thumb  Right  07/20/2013   CLINICAL DATA:  Swelling and pain  EXAM: RIGHT THUMB 2+V  COMPARISON:  None.  FINDINGS: There is no evidence of fracture or dislocation. There is no evidence of arthropathy or other focal bone abnormality. Soft tissues are unremarkable There is a 1 mm linear calcific density projecting posterior to the interphalangeal joint.  IMPRESSION: No acute abnormality   Electronically Signed   By: Oley Balm M.D.   On: 07/20/2013 19:06    EKG Interpretation None     Medications - No data to display MDM   Final diagnoses:  Thumb sprain  Trigger finger    Patient with trigger finger. Discharged home with hand followup.  I personally performed the services described in this documentation, which was scribed in my presence. The recorded information has been reviewed and is accurate.     Roxy Horseman, PA-C 07/21/13 0430

## 2013-07-20 NOTE — Discharge Instructions (Signed)
Finger Sprain A finger sprain is a tear in one of the strong, fibrous tissues that connect the bones (ligaments) in your finger. The severity of the sprain depends on how much of the ligament is torn. The tear can be either partial or complete. CAUSES  Often, sprains are a result of a fall or accident. If you extend your hands to catch an object or to protect yourself, the force of the impact causes the fibers of your ligament to stretch too much. This excess tension causes the fibers of your ligament to tear. SYMPTOMS  You may have some loss of motion in your finger. Other symptoms include:  Bruising.  Tenderness.  Swelling. DIAGNOSIS  In order to diagnose finger sprain, your caregiver will physically examine your finger or thumb to determine how torn the ligament is. Your caregiver may also suggest an X-ray exam of your finger to make sure no bones are broken. TREATMENT  If your ligament is only partially torn, treatment usually involves keeping the finger in a fixed position (immobilization) for a short period. To do this, your caregiver will apply a bandage, cast, or splint to keep your finger from moving until it heals. For a partially torn ligament, the healing process usually takes 2 to 3 weeks. If your ligament is completely torn, you may need surgery to reconnect the ligament to the bone. After surgery a cast or splint will be applied and will need to stay on your finger or thumb for 4 to 6 weeks while your ligament heals. HOME CARE INSTRUCTIONS  Keep your injured finger elevated, when possible, to decrease swelling.  To ease pain and swelling, apply ice to your joint twice a day, for 2 to 3 days:  Put ice in a plastic bag.  Place a towel between your skin and the bag.  Leave the ice on for 15 minutes.  Only take over-the-counter or prescription medicine for pain as directed by your caregiver.  Do not wear rings on your injured finger.  Do not leave your finger unprotected  until pain and stiffness go away (usually 3 to 4 weeks).  Do not allow your cast or splint to get wet. Cover your cast or splint with a plastic bag when you shower or bathe. Do not swim.  Your caregiver may suggest special exercises for you to do during your recovery to prevent or limit permanent stiffness. SEEK IMMEDIATE MEDICAL CARE IF:  Your cast or splint becomes damaged.  Your pain becomes worse rather than better. MAKE SURE YOU:  Understand these instructions.  Will watch your condition.  Will get help right away if you are not doing well or get worse. Document Released: 03/20/2004 Document Revised: 05/05/2011 Document Reviewed: 10/14/2010 [facility] Patient Information 2014 [place], [place]. Trigger Finger Trigger finger (digital tendinitis and stenosing tenosynovitis) is a common disorder that causes an often painful catching of the fingers or thumb. It occurs as a clicking, snapping, or locking of a finger in the palm of the hand. This is caused by a problem with the tendons that flex or bend the fingers sliding smoothly through their sheaths. The condition may occur in any finger or a couple fingers at the same time.  The finger may lock with the finger curled or suddenly straighten out with a snap. This is more common in patients with rheumatoid arthritis and diabetes. Left untreated, the condition may get worse to the point where the finger becomes locked in flexion, like making a fist, or less commonly  locked with the finger straightened out. CAUSES   Inflammation and scarring that lead to swelling around the tendon sheath.  Repeated or forceful movements.  Rheumatoid arthritis, an autoimmune disease that affects joints.  Gout.  Diabetes mellitus. SIGNS AND SYMPTOMS  Soreness and swelling of your finger.  A painful clicking or snapping as you bend and straighten your finger. DIAGNOSIS  Your health care provider will do a physical exam of your finger to diagnose  trigger finger. TREATMENT   Splinting for 6 8 weeks may be helpful.  Nonsteroidal anti-inflammatory medicines (NSAIDs) can help to relieve the pain and inflammation.  Cortisone injections, along with splinting, may speed up recovery. Several injections may be required. Cortisone may give relief after one injection.  Surgery is another treatment that may be used if conservative treatments do not work. Surgery can be minor, without incisions (a cut does not have to be made), and can be done with a needle through the skin.  Other surgical choices involve an open procedure in which the surgeon opens the hand through a small incision and cuts the pulley so the tendon can again slide smoothly. Your hand will still work fine. HOME CARE INSTRUCTIONS  Apply ice to the injured area, twice per day:  Put ice in a plastic bag.  Place a towel between your skin and the bag.  Leave the ice on for 20 minutes, 3 4 times a day.  Rest your hand often. MAKE SURE YOU:   Understand these instructions.  Will watch your condition.  Will get help right away if you are not doing well or get worse. Document Released: 12/01/2003 Document Revised: 10/13/2012 Document Reviewed: 07/13/2012 [facility] Patient Information 2014 [place]

## 2013-07-21 NOTE — ED Provider Notes (Signed)
Medical screening examination/treatment/procedure(s) were performed by non-physician practitioner and as supervising physician I was immediately available for consultation/collaboration.   EKG Interpretation None       Courtney F Horton, MD 07/21/13 0902 

## 2013-08-29 ENCOUNTER — Encounter: Payer: Self-pay | Admitting: Internal Medicine

## 2013-08-29 ENCOUNTER — Ambulatory Visit: Payer: No Typology Code available for payment source | Attending: Internal Medicine | Admitting: Internal Medicine

## 2013-08-29 VITALS — BP 145/90 | HR 78 | Temp 98.2°F | Resp 17 | Wt 173.0 lb

## 2013-08-29 DIAGNOSIS — Z0000 Encounter for general adult medical examination without abnormal findings: Secondary | ICD-10-CM | POA: Insufficient documentation

## 2013-08-29 DIAGNOSIS — IMO0001 Reserved for inherently not codable concepts without codable children: Secondary | ICD-10-CM

## 2013-08-29 DIAGNOSIS — N508 Other specified disorders of male genital organs: Secondary | ICD-10-CM | POA: Insufficient documentation

## 2013-08-29 DIAGNOSIS — F17200 Nicotine dependence, unspecified, uncomplicated: Secondary | ICD-10-CM | POA: Insufficient documentation

## 2013-08-29 DIAGNOSIS — M79609 Pain in unspecified limb: Secondary | ICD-10-CM | POA: Insufficient documentation

## 2013-08-29 DIAGNOSIS — Z1211 Encounter for screening for malignant neoplasm of colon: Secondary | ICD-10-CM

## 2013-08-29 DIAGNOSIS — Z139 Encounter for screening, unspecified: Secondary | ICD-10-CM

## 2013-08-29 DIAGNOSIS — R030 Elevated blood-pressure reading, without diagnosis of hypertension: Secondary | ICD-10-CM | POA: Insufficient documentation

## 2013-08-29 DIAGNOSIS — M79644 Pain in right finger(s): Secondary | ICD-10-CM

## 2013-08-29 DIAGNOSIS — N503 Cyst of epididymis: Secondary | ICD-10-CM

## 2013-08-29 MED ORDER — VARENICLINE TARTRATE 0.5 MG X 11 & 1 MG X 42 PO MISC: ORAL | Status: DC

## 2013-08-29 NOTE — Progress Notes (Signed)
MRN: [id number] Name: Kevin Maxwell  Sex: male Age: 63 y.o. DOB: Jun 15, 1950  Allergies: Aspirin and Ibuprofen  Chief Complaint  Patient presents with  . thumb pain  . Cyst    HPI: Patient is 63 y.o. male who comes today reported to have right thumb pain, patient also went to the emergency room had an x-ray done which was negative for dislocation or fracture, vision still wears the splint, was advised to follow with orthopedics but could not make an appointment patient reports improvement but still has pain, he also history of epididymal cyst sometimes as per patient it becomes painful denies any fever chills any penile discharge, patient was referred to urologist in the past as per patient he could not make an appointment, today's blood pressure is elevated denies any headache dizziness chest and shortness of breath, repeat manual blood pressure is 1 45/90 as per patient he consumes lot of salt, and also smokes cigarettes, advised patient to quit smoking, is agreeable to try Chantix, denies any previous history of anxiety or depression.  Past Medical History  Diagnosis Date  . Shoulder problem     left shoulder    Past Surgical History  Procedure Laterality Date  . Hernia repair        Medication List       This list is accurate as of: 08/29/13  5:02 PM.  Always use your most recent med list.               HYDROcodone-acetaminophen 5-325 MG per tablet  Commonly known as:  NORCO  Take 1 tablet by mouth every 6 (six) hours as needed.     HYDROcodone-acetaminophen 5-325 MG per tablet  Commonly known as:  NORCO/VICODIN  Take 1-2 tablets by mouth every 6 (six) hours as needed.     varenicline 0.5 MG X 11 & 1 MG X 42 tablet  Commonly known as:  CHANTIX STARTING MONTH PAK  Take one 0.5 mg tablet by mouth once daily for 3 days, then increase to one 0.5 mg tablet twice daily for 4 days, then increase to one 1 mg tablet twice daily.        Meds ordered this encounter    Medications  . varenicline (CHANTIX STARTING MONTH PAK) 0.5 MG X 11 & 1 MG X 42 tablet    Sig: Take one 0.5 mg tablet by mouth once daily for 3 days, then increase to one 0.5 mg tablet twice daily for 4 days, then increase to one 1 mg tablet twice daily.    Dispense:  53 tablet    Refill:  0     There is no immunization history on file for this patient.  No family history on file.  History  Substance Use Topics  . Smoking status: Current Every Day Smoker -- 0.50 packs/day    Types: Cigarettes  . Smokeless tobacco: Never Used  . Alcohol Use: Yes     Comment: quart to 1.5 after work each day of beer    Review of Systems   As noted in HPI  Filed Vitals:   08/29/13 1650  BP: 145/90  Pulse:   Temp:   Resp:     Physical Exam  Physical Exam  Constitutional: No distress.  Eyes: EOM are normal. Pupils are equal, round, and reactive to light.  Cardiovascular: Normal rate and regular rhythm.   Pulmonary/Chest: Breath sounds normal. No respiratory distress. He has no wheezes. He has no rales.  Abdominal: Soft.  There is no tenderness.  Genitourinary:  Palpable epididymis non tender, no penile discharge or rash   Musculoskeletal: He exhibits no edema.  Right thumb some tenderness at the base, good ROM    CBC    Component Value Date/Time   WBC 4.6 05/09/2012 1347   RBC 4.61 05/09/2012 1347   HGB 14.4 05/09/2012 1347   HCT 41.4 05/09/2012 1347   PLT 257 05/09/2012 1347   MCV 89.8 05/09/2012 1347   LYMPHSABS 1.7 05/09/2012 1347   MONOABS 0.6 05/09/2012 1347   EOSABS 0.0 05/09/2012 1347   BASOSABS 0.0 05/09/2012 1347    CMP     Component Value Date/Time   NA 136 05/09/2012 1347   K 4.4 05/09/2012 1347   CL 101 05/09/2012 1347   CO2 26 05/09/2012 1347   GLUCOSE 97 05/09/2012 1347   BUN 17 05/09/2012 1347   CREATININE 0.96 05/09/2012 1347   CALCIUM 9.0 05/09/2012 1347   PROT 7.3 05/09/2012 1347   ALBUMIN 3.7 05/09/2012 1347   AST 19 05/09/2012 1347   ALT 17 05/09/2012 1347    ALKPHOS 41 05/09/2012 1347   BILITOT 0.6 05/09/2012 1347   GFRNONAA 89* 05/09/2012 1347   GFRAA >90 05/09/2012 1347    No results found for this basename: chol, tri, ldl    No components found with this basename: hga1c    Lab Results  Component Value Date/Time   AST 19 05/09/2012  1:47 PM    Assessment and Plan  Elevated BP Have advised patient for DASH diet and could smoking, followup on the next visit if blood pressure is elevated consider starting her medication.  Smoking - Plan: varenicline (CHANTIX STARTING MONTH PAK) 0.5 MG X 11 & 1 MG X 42 tablet  Special screening for malignant neoplasms, colon - Plan: Ambulatory referral to Gastroenterology  Epididymal cyst - Plan: Ambulatory referral to Urology  Pain of right thumb - Plan: Ambulatory referral to Orthopedic Surgery  Screening - Plan: Baseline fasting blood work, CBC with Differential, COMPLETE METABOLIC PANEL WITH GFR, Lipid panel, TSH, Vit D  25 hydroxy (rtn osteoporosis monitoring)   Health Maintenance -Colonoscopy: referred to GI  Return in about 3 months (around 11/29/2013) for hypertension.  Doris CheadleADVANI, Delano Scardino, MD

## 2013-08-29 NOTE — Patient Instructions (Signed)
Smoking Cessation Quitting smoking is important to your health and has many advantages. However, it is not always easy to quit since nicotine is a very addictive drug. Often times, people try 3 times or more before being able to quit. This document explains the best ways for you to prepare to quit smoking. Quitting takes hard work and a lot of effort, but you can do it. ADVANTAGES OF QUITTING SMOKING  You will live longer, feel better, and live better.  Your body will feel the impact of quitting smoking almost immediately.  Within 20 minutes, blood pressure decreases. Your pulse returns to its normal level.  After 8 hours, carbon monoxide levels in the blood return to normal. Your oxygen level increases.  After 24 hours, the chance of having a heart attack starts to decrease. Your breath, hair, and body stop smelling like smoke.  After 48 hours, damaged nerve endings begin to recover. Your sense of taste and smell improve.  After 72 hours, the body is virtually free of nicotine. Your bronchial tubes relax and breathing becomes easier.  After 2 to 12 weeks, lungs can hold more air. Exercise becomes easier and circulation improves.  The risk of having a heart attack, stroke, cancer, or lung disease is greatly reduced.  After 1 year, the risk of coronary heart disease is cut in half.  After 5 years, the risk of stroke falls to the same as a nonsmoker.  After 10 years, the risk of lung cancer is cut in half and the risk of other cancers decreases significantly.  After 15 years, the risk of coronary heart disease drops, usually to the level of a nonsmoker.  If you are pregnant, quitting smoking will improve your chances of having a healthy baby.  The people you live with, especially any children, will be healthier.  You will have extra money to spend on things other than cigarettes. QUESTIONS TO THINK ABOUT BEFORE ATTEMPTING TO QUIT You may want to talk about your answers with your  caregiver.  Why do you want to quit?  If you tried to quit in the past, what helped and what did not?  What will be the most difficult situations for you after you quit? How will you plan to handle them?  Who can help you through the tough times? Your family? Friends? A caregiver?  What pleasures do you get from smoking? What ways can you still get pleasure if you quit? Here are some questions to ask your caregiver:  How can you help me to be successful at quitting?  What medicine do you think would be best for me and how should I take it?  What should I do if I need more help?  What is smoking withdrawal like? How can I get information on withdrawal? GET READY  Set a quit date.  Change your environment by getting rid of all cigarettes, ashtrays, matches, and lighters in your home, car, or work. Do not let people smoke in your home.  Review your past attempts to quit. Think about what worked and what did not. GET SUPPORT AND ENCOURAGEMENT You have a better chance of being successful if you have help. You can get support in many ways.  Tell your family, friends, and co-workers that you are going to quit and need their support. Ask them not to smoke around you.  Get individual, group, or telephone counseling and support. Programs are available at local hospitals and health centers. Call your local health department for   information about programs in your area.  Spiritual beliefs and practices may help some smokers quit.  Download a "quit meter" on your computer to keep track of quit statistics, such as how long you have gone without smoking, cigarettes not smoked, and money saved.  Get a self-help book about quitting smoking and staying off of tobacco. [place] yourself from urges to smoke. Talk to someone, go for a walk, or occupy your time with a task.  Change your normal routine. Take a different route to work. Drink tea instead of coffee.  Eat breakfast in a different place.  Reduce your stress. Take a hot bath, exercise, or read a book.  Plan something enjoyable to do every day. Reward yourself for not smoking.  Explore interactive web-based programs that specialize in helping you quit. GET MEDICINE AND USE IT CORRECTLY Medicines can help you stop smoking and decrease the urge to smoke. Combining medicine with the above behavioral methods and support can greatly increase your chances of successfully quitting smoking.  Nicotine replacement therapy helps deliver nicotine to your body without the negative effects and risks of smoking. Nicotine replacement therapy includes nicotine gum, lozenges, inhalers, nasal sprays, and skin patches. Some may be available over-the-counter and others require a prescription.  Antidepressant medicine helps people abstain from smoking, but how this works is unknown. This medicine is available by prescription.  Nicotinic receptor partial agonist medicine simulates the effect of nicotine in your brain. This medicine is available by prescription. Ask your caregiver for advice about which medicines to use and how to use them based on your health history. Your caregiver will tell you what side effects to look out for if you choose to be on a medicine or therapy. Carefully read the information on the package. Do not use any other product containing nicotine while using a nicotine replacement product.  RELAPSE OR DIFFICULT SITUATIONS Most relapses occur within the first 3 months after quitting. Do not be discouraged if you start smoking again. Remember, most people try several times before finally quitting. You may have symptoms of withdrawal because your body is used to nicotine. You may crave cigarettes, be irritable, feel very hungry, cough often, get headaches, or have difficulty concentrating. The withdrawal symptoms are only temporary. They are strongest when you first quit, but they will go away within  10-14 days. To reduce the chances of relapse, try to:  Avoid drinking alcohol. Drinking lowers your chances of successfully quitting.  Reduce the amount of caffeine you consume. Once you quit smoking, the amount of caffeine in your body increases and can give you symptoms, such as a rapid heartbeat, sweating, and anxiety.  Avoid smokers because they can make you want to smoke.  Do not let weight gain distract you. Many smokers will gain weight when they quit, usually less than 10 pounds. Eat a healthy diet and stay active. You can always lose the weight gained after you quit.  Find ways to improve your mood other than smoking. FOR MORE INFORMATION  www.smokefree.gov  Document Released: 02/04/2001 Document Revised: 08/12/2011 Document Reviewed: 05/22/2011 [facility] Patient Information 2015 [place], [place]. This information is not intended to replace advice given to you by your health care provider. Make sure you discuss any questions you have with your health care provider. DASH Eating Plan DASH stands for "Dietary Approaches to Stop Hypertension." The DASH eating plan is a healthy eating plan that has been shown to reduce high blood pressure (hypertension).  Additional health benefits may include reducing the risk of type 2 diabetes mellitus, heart disease, and stroke. The DASH eating plan may also help with weight loss. WHAT DO I NEED TO KNOW ABOUT THE DASH EATING PLAN? For the DASH eating plan, you will follow these general guidelines:  Choose foods with a percent daily value for sodium of less than 5% (as listed on the food label).  Use salt-free seasonings or herbs instead of table salt or sea salt.  Check with your health care provider or pharmacist before using salt substitutes.  Eat lower-sodium products, often labeled as "lower sodium" or "no salt added."  Eat fresh foods.  Eat more vegetables, fruits, and low-fat dairy products.  Choose whole grains. Look for the word "whole"  as the first word in the ingredient list.  Choose fish and skinless chicken or [place] more often than red meat. Limit fish, poultry, and meat to 6 oz (170 g) each day.  Limit sweets, desserts, sugars, and sugary drinks.  Choose heart-healthy fats.  Limit cheese to 1 oz (28 g) per day.  Eat more home-cooked food and less restaurant, buffet, and fast food.  Limit fried foods.  Cook foods using methods other than frying.  Limit canned vegetables. If you do use them, rinse them well to decrease the sodium.  When eating at a restaurant, ask that your food be prepared with less salt, or no salt if possible. WHAT FOODS CAN I EAT? Seek help from a dietitian for individual calorie needs. Grains Whole grain or whole wheat bread. Brown rice. Whole grain or whole wheat pasta. Quinoa, bulgur, and whole grain cereals. Low-sodium cereals. Corn or whole wheat flour tortillas. Whole grain cornbread. Whole grain crackers. Low-sodium crackers. Vegetables Fresh or frozen vegetables (raw, steamed, roasted, or grilled). Low-sodium or reduced-sodium tomato and vegetable juices. Low-sodium or reduced-sodium tomato sauce and paste. Low-sodium or reduced-sodium canned vegetables.  Fruits All fresh, canned (in natural juice), or frozen fruits. Meat and Other Protein Products Ground beef (85% or leaner), grass-fed beef, or beef trimmed of fat. Skinless chicken or [place]. Ground chicken or [place]. Pork trimmed of fat. All fish and seafood. Eggs. Dried beans, peas, or lentils. Unsalted nuts and seeds. Unsalted canned beans. Dairy Low-fat dairy products, such as skim or 1% milk, 2% or reduced-fat cheeses, low-fat ricotta or cottage cheese, or plain low-fat yogurt. Low-sodium or reduced-sodium cheeses. Fats and Oils Tub margarines without trans fats. Light or reduced-fat mayonnaise and salad dressings (reduced sodium). Avocado. Safflower, olive, or canola oils. Natural peanut or almond butter. Other Unsalted  popcorn and pretzels. The items listed above may not be a complete list of recommended foods or beverages. Contact your dietitian for more options. WHAT FOODS ARE NOT RECOMMENDED? Grains White bread. White pasta. White rice. Refined cornbread. Bagels and croissants. Crackers that contain trans fat. Vegetables Creamed or fried vegetables. Vegetables in a cheese sauce. Regular canned vegetables. Regular canned tomato sauce and paste. Regular tomato and vegetable juices. Fruits Dried fruits. Canned fruit in light or heavy syrup. Fruit juice. Meat and Other Protein Products Fatty cuts of meat. Ribs, chicken wings, bacon, sausage, bologna, salami, chitterlings, fatback, hot dogs, bratwurst, and packaged luncheon meats. Salted nuts and seeds. Canned beans with salt. Dairy Whole or 2% milk, cream, half-and-half, and cream cheese. Whole-fat or sweetened yogurt. Full-fat cheeses or blue cheese. Nondairy creamers and whipped toppings. Processed cheese, cheese spreads, or cheese curds. Condiments Onion and garlic salt, seasoned salt, table salt, and sea salt. Canned  and packaged gravies. Worcestershire sauce. Tartar sauce. Barbecue sauce. Teriyaki sauce. Soy sauce, including reduced sodium. Steak sauce. Fish sauce. Oyster sauce. Cocktail sauce. Horseradish. Ketchup and mustard. Meat flavorings and tenderizers. Bouillon cubes. Hot sauce. Tabasco sauce. Marinades. Taco seasonings. Relishes. Fats and Oils Butter, stick margarine, lard, shortening, ghee, and bacon fat. Coconut, palm kernel, or palm oils. Regular salad dressings. Other Pickles and olives. Salted popcorn and pretzels. The items listed above may not be a complete list of foods and beverages to avoid. Contact your dietitian for more information. WHERE CAN I FIND MORE INFORMATION? National Heart, Lung, and Blood Institute: CableProm[employer].itwww.nhlbi.nih.gov/health/health-topics/topics/dash/ Document Released: 01/30/2011 Document Revised: 02/15/2013 Document  Reviewed: 12/15/2012 [facility] Patient Information 2015 [place], [place]. This information is not intended to replace advice given to you by your health care provider. Make sure you discuss any questions you have with your health care provider.

## 2013-08-29 NOTE — Progress Notes (Signed)
Patient complains of pain to right thumb States was seen at [facility] ED for a cyst to the  Right scrotum- very painful

## 2013-09-05 ENCOUNTER — Telehealth: Payer: Self-pay

## 2013-09-05 ENCOUNTER — Telehealth: Payer: Self-pay | Admitting: Internal Medicine

## 2013-09-05 ENCOUNTER — Ambulatory Visit: Payer: No Typology Code available for payment source | Attending: Internal Medicine

## 2013-09-05 DIAGNOSIS — Z139 Encounter for screening, unspecified: Secondary | ICD-10-CM

## 2013-09-05 LAB — CBC WITH DIFFERENTIAL/PLATELET
BASOS PCT: 0 % (ref 0–1)
Basophils Absolute: 0 10*3/uL (ref 0.0–0.1)
EOS ABS: 0.1 10*3/uL (ref 0.0–0.7)
EOS PCT: 2 % (ref 0–5)
HEMATOCRIT: 45.2 % (ref 39.0–52.0)
HEMOGLOBIN: 15.7 g/dL (ref 13.0–17.0)
LYMPHS ABS: 2.5 10*3/uL (ref 0.7–4.0)
Lymphocytes Relative: 47 % — ABNORMAL HIGH (ref 12–46)
MCH: 30.4 pg (ref 26.0–34.0)
MCHC: 34.7 g/dL (ref 30.0–36.0)
MCV: 87.6 fL (ref 78.0–100.0)
MONO ABS: 0.5 10*3/uL (ref 0.1–1.0)
MONOS PCT: 9 % (ref 3–12)
Neutro Abs: 2.2 10*3/uL (ref 1.7–7.7)
Neutrophils Relative %: 42 % — ABNORMAL LOW (ref 43–77)
Platelets: 251 10*3/uL (ref 150–400)
RBC: 5.16 MIL/uL (ref 4.22–5.81)
RDW: 14.7 % (ref 11.5–15.5)
WBC: 5.3 10*3/uL (ref 4.0–10.5)

## 2013-09-05 LAB — COMPLETE METABOLIC PANEL WITH GFR
ALK PHOS: 41 U/L (ref 39–117)
ALT: 17 U/L (ref 0–53)
AST: 16 U/L (ref 0–37)
Albumin: 4.7 g/dL (ref 3.5–5.2)
BILIRUBIN TOTAL: 0.8 mg/dL (ref 0.2–1.2)
BUN: 16 mg/dL (ref 6–23)
CO2: 24 meq/L (ref 19–32)
Calcium: 9.1 mg/dL (ref 8.4–10.5)
Chloride: 101 mEq/L (ref 96–112)
Creat: 0.84 mg/dL (ref 0.50–1.35)
GLUCOSE: 102 mg/dL — AB (ref 70–99)
Potassium: 4.3 mEq/L (ref 3.5–5.3)
Sodium: 135 mEq/L (ref 135–145)
TOTAL PROTEIN: 7.5 g/dL (ref 6.0–8.3)

## 2013-09-05 LAB — LIPID PANEL
CHOL/HDL RATIO: 2.3 ratio
CHOLESTEROL: 238 mg/dL — AB (ref 0–200)
HDL: 104 mg/dL (ref >39)
LDL Cholesterol: 120 mg/dL — ABNORMAL HIGH (ref 0–99)
Triglycerides: 68 mg/dL (ref <150)
VLDL: 14 mg/dL (ref 0–40)

## 2013-09-05 NOTE — Telephone Encounter (Signed)
Pt. Would like a prescription for muscle relaxer's, he just had an appt. With Dr. Orpah CobbAdvani and forgot to mention it. Please call patient .

## 2013-09-05 NOTE — Telephone Encounter (Signed)
Returned patient phone call Patient not available  

## 2013-09-06 ENCOUNTER — Telehealth: Payer: Self-pay

## 2013-09-06 LAB — TSH: TSH: 0.496 u[IU]/mL (ref 0.350–4.500)

## 2013-09-06 LAB — VITAMIN D 25 HYDROXY (VIT D DEFICIENCY, FRACTURES): VIT D 25 HYDROXY: 59 ng/mL (ref 30–89)

## 2013-09-06 NOTE — Telephone Encounter (Signed)
Patient is aware of his lab results 

## 2013-09-06 NOTE — Telephone Encounter (Signed)
Message copied by Lestine MountJUAREZ, Roben Schliep L on Tue Sep 06, 2013 12:27 PM ------      Message from: Doris CheadleADVANI, DEEPAK      Created: Tue Sep 06, 2013 11:50 AM       Blood work reviewed noticed impaired fasting glucose and elevated cholesterol, call and advise patient for low carbohydrate and low-fat diet.       ------

## 2013-09-15 ENCOUNTER — Ambulatory Visit: Payer: No Typology Code available for payment source | Admitting: Sports Medicine

## 2013-09-22 ENCOUNTER — Encounter: Payer: Self-pay | Admitting: Sports Medicine

## 2013-09-22 ENCOUNTER — Ambulatory Visit (INDEPENDENT_AMBULATORY_CARE_PROVIDER_SITE_OTHER): Payer: No Typology Code available for payment source | Admitting: Sports Medicine

## 2013-09-22 VITALS — BP 164/94 | Ht 69.0 in | Wt 163.0 lb

## 2013-09-22 DIAGNOSIS — M79609 Pain in unspecified limb: Secondary | ICD-10-CM

## 2013-09-22 DIAGNOSIS — M65311 Trigger thumb, right thumb: Secondary | ICD-10-CM | POA: Insufficient documentation

## 2013-09-22 DIAGNOSIS — M79644 Pain in right finger(s): Secondary | ICD-10-CM

## 2013-09-22 DIAGNOSIS — M6530 Trigger finger, unspecified finger: Secondary | ICD-10-CM

## 2013-09-22 MED ORDER — METHYLPREDNISOLONE ACETATE 40 MG/ML IJ SUSP
40.0000 mg | Freq: Once | INTRAMUSCULAR | Status: AC
  Administered 2013-09-22: 40 mg via INTRA_ARTICULAR

## 2013-09-22 NOTE — Progress Notes (Signed)
  Jair Younan - 63 y.o. male Unice BaileyMRN [id number]  Date of birth: [birth year]  SUBJECTIVE:  Including CC & ROS.  Richardson DoppCole is a 63 y.o. male who presents with chief complaint of constant, throbbing right thumb pain and swelling, with frequent locking of the joint, on going for 2 months. He reports when he bends his thumb down he feels the finger pop out and catch and when he bends it back up he feels it pop quickly back in place, much like a rubber band. He reports a sharp pain with this movement, that radiates down into the palm of his hand.He reports while he is working his thumb particular bothers him with any gripping activities such as weeding. He reports trouble sleeping due to his constant pain, stating he has a hard time finding a relieving position. He denies any other symptoms.  ROS: Review of systems otherwise negative except for information present in HPI  HISTORY: Past Medical, Surgical, Social, and Family History Reviewed & Updated per EMR. Pertinent Historical Findings include: Tobacco abuse and HTN 2 view thumb Xray from the ED show no significant OA, no fracture or dislocation  PHYSICAL EXAM:  VS: BP:164/94 mmHg  HR: bpm  TEMP: ( )  RESP:   HT:5\' 9"  (175.3 cm)   WT:163 lb (73.936 kg)  BMI:24.1 PHYSICAL EXAM: HAND EXAM: General: well nourished,  Skin of UE: warm but not hot; dry, no rashes, lesions, ecchymosis or erythema. Vascular: radial pulses 2+ bilaterally Neurologically: Sensation to light touch upper extremities equal and intact bilaterally.        Observation: no palmar or dorsal hand edema, no swelling, no erythema, no bruising  Palpation: TPP over the A1 pulley of the 1st digit at the base of the 1st metatarsal some MCP tenderness, no radial or ulnar styloid pain, no pain over the tendons sheath of each tunnel    ROM: normal ROM in supination and pronation, elbow extension and flexion    Strength: Normal 5/5 strength with extension/ flexion, pronation and supination.     No  pain 5/5 strength of each digit in flexion and extension   Special test: 1st digit triggering with flexion, stable medial and lateral collateral ligaments of each finger  Injection procedure: Consent obtained and verified. Sterile betadine prep. Furthur cleansed with alcohol. Topical analgesic spray: Ethyl chloride. Injection Indication: A1 pulley trigger finger of the 1st digit Approached in typical fashion with: hand extended and thumb extended Completed without difficulty Meds: 1cc lidocaine/ 1cc of DepoMedrol was draw up only 1cc of mixure was injected.  Needle: 30 g 1/2 in Aftercare instructions and Red flags advised. Advised to call if fevers/chills, erythema, induration, drainage, or persistent bleeding.   ASSESSMENT & PLAN: See problem based charting & AVS for pt instructions.

## 2013-09-22 NOTE — Assessment & Plan Note (Signed)
Assessment: 1st digit trigger finger with tendonitis of the flexor pollicis long tendon  Plan:  Patient given option of continue bracing with thumb spica daily, trigger finger injection / bracing, or referral to ortho for A1 pulley release. Patient at first prefer referral to surgery, however, because of his lack of health insurance he can not afford the surgical option. Patient was agree with injection today, he understand risk and benefits and will return if not improved in 3-4 weeks otherwise PRN.

## 2013-09-22 NOTE — Patient Instructions (Addendum)
Wear brace daily for 1 week. May remove for showering, and bedtime

## 2013-10-03 ENCOUNTER — Encounter: Payer: Self-pay | Admitting: Internal Medicine

## 2013-10-27 ENCOUNTER — Other Ambulatory Visit (HOSPITAL_COMMUNITY): Payer: Self-pay | Admitting: Urology

## 2013-10-27 DIAGNOSIS — N4340 Spermatocele of epididymis, unspecified: Secondary | ICD-10-CM

## 2013-10-27 DIAGNOSIS — R310 Gross hematuria: Secondary | ICD-10-CM

## 2013-11-04 ENCOUNTER — Ambulatory Visit (HOSPITAL_COMMUNITY)
Admission: RE | Admit: 2013-11-04 | Discharge: 2013-11-04 | Disposition: A | Discharge status: Home / Self Care | Payer: No Typology Code available for payment source | Source: Ambulatory Visit | Attending: Urology | Admitting: Urology

## 2013-11-04 DIAGNOSIS — R310 Gross hematuria: Secondary | ICD-10-CM

## 2013-11-04 DIAGNOSIS — N4340 Spermatocele of epididymis, unspecified: Secondary | ICD-10-CM | POA: Insufficient documentation

## 2013-11-04 IMAGING — US US SCROTUM
1 series · 14 of 25 positions shown · non-contrast
Comparison: [DATE] scrotal ultrasound

CLINICAL DATA: 62-year-old male with follow-up of right scrotal
mass.

EXAM:
ULTRASOUND OF SCROTUM
TECHNIQUE: Complete ultrasound examination of the testicles, epididymis, and
other scrotal structures was performed.

[Series 1: us scrotum · 0.06mm/px · 14 of 61 slices shown]
[im 1/61]
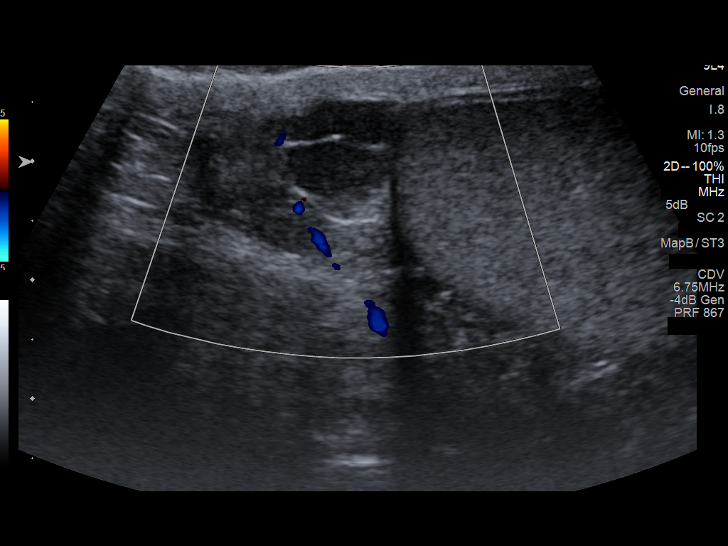
[im 6/61]
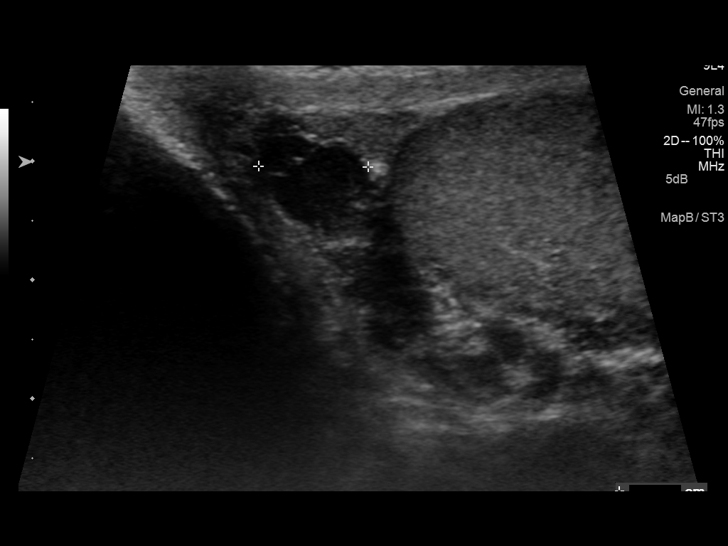
[im 11/61]
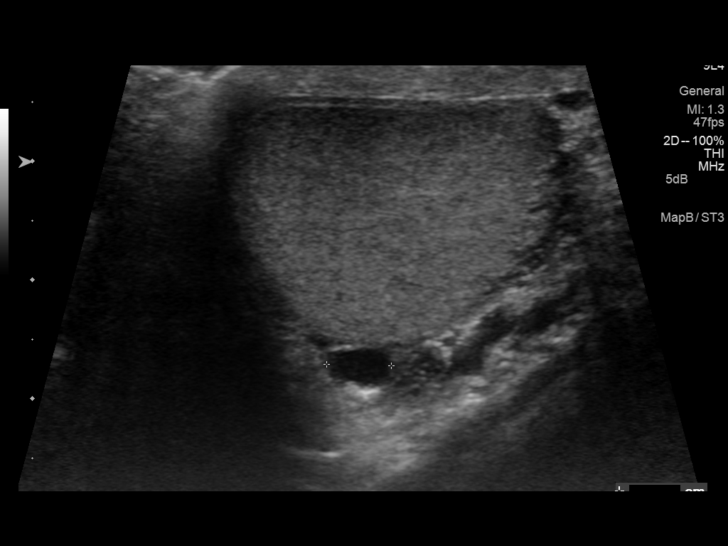
[im 16/61]
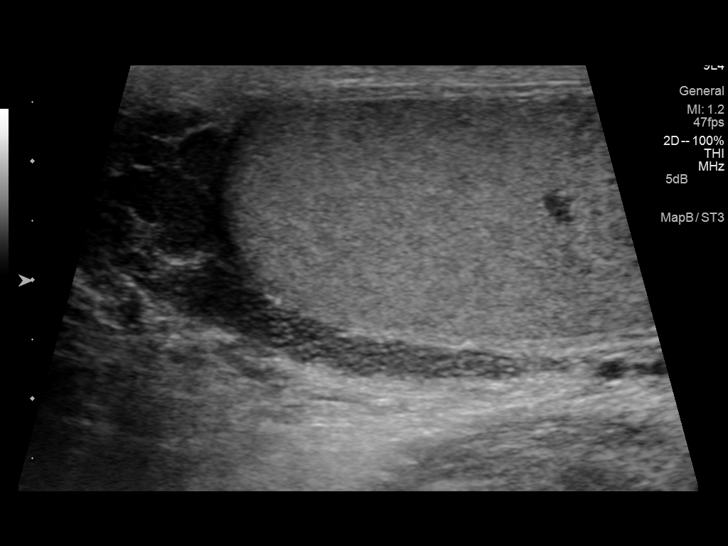
[im 21/61]
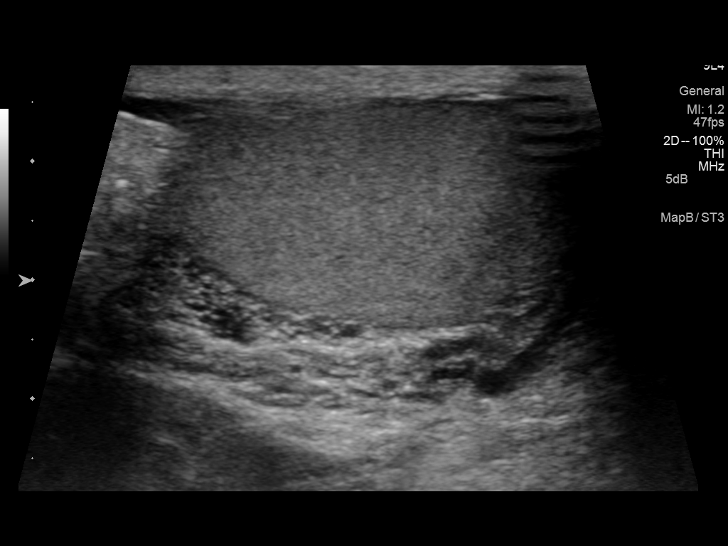
[im 23/61]
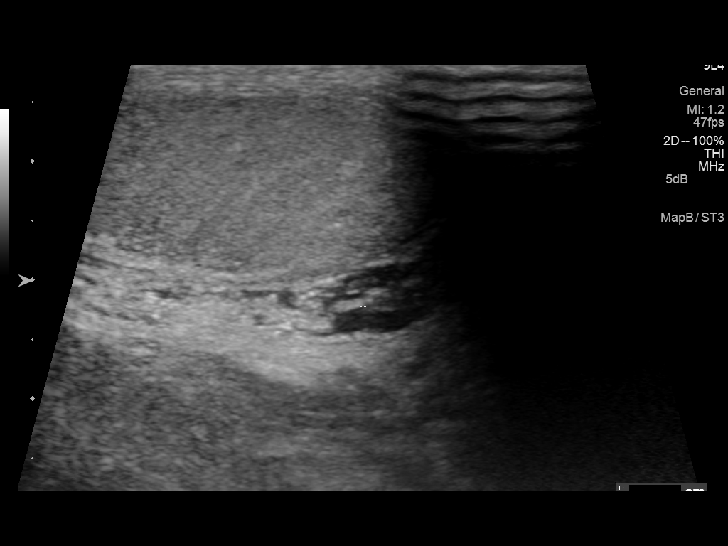
[im 28/61]
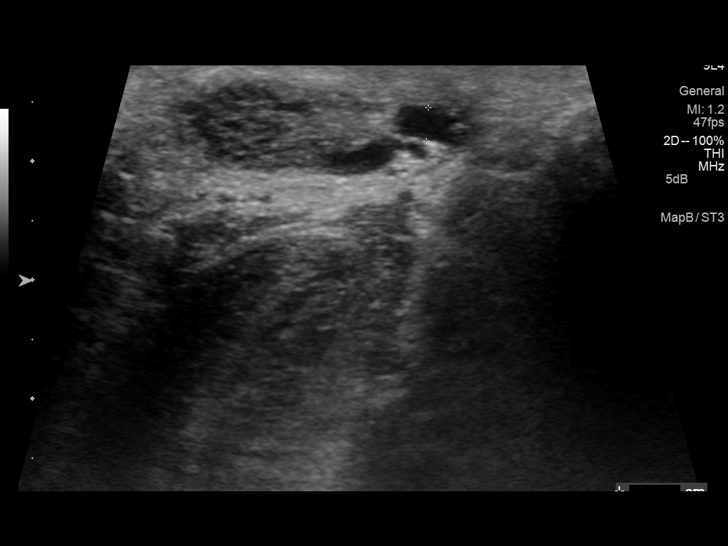
[im 33/61]
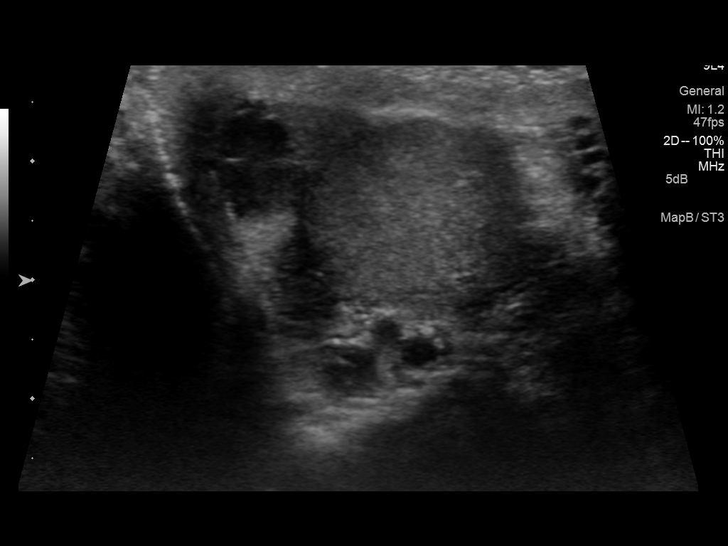
[im 38/61]
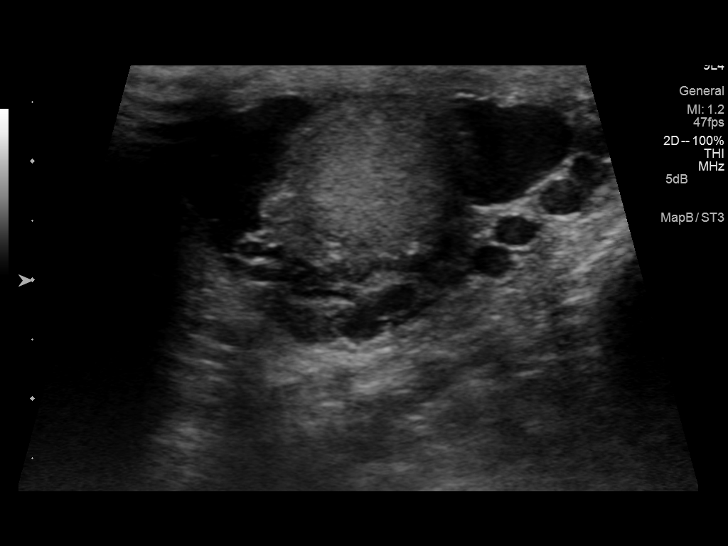
[im 41/61]
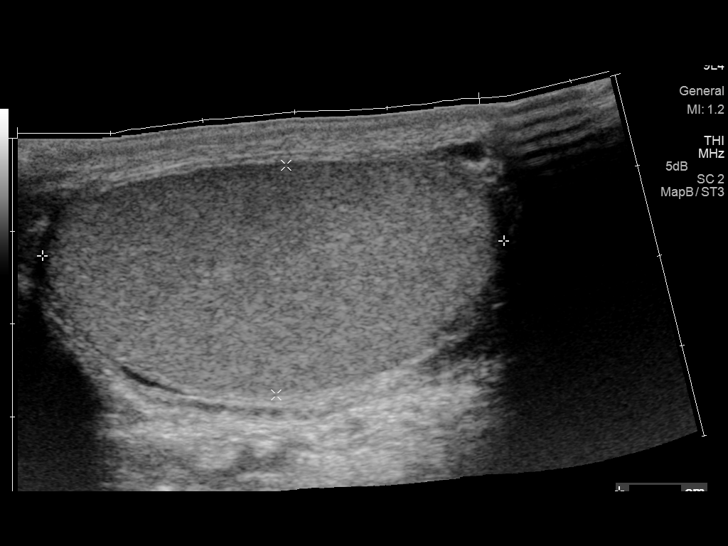
[im 46/61]
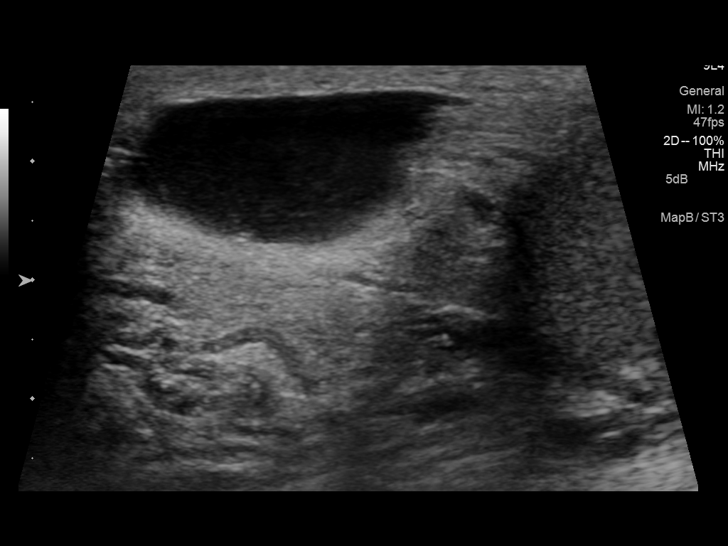
[im 51/61]
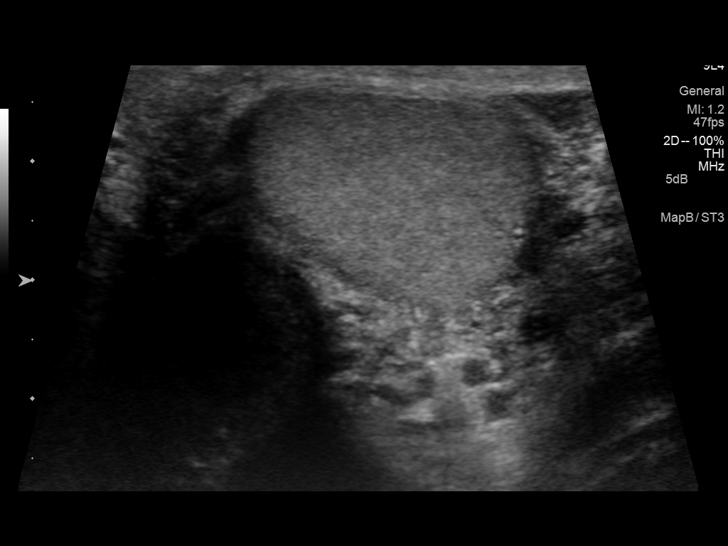
[im 56/61]
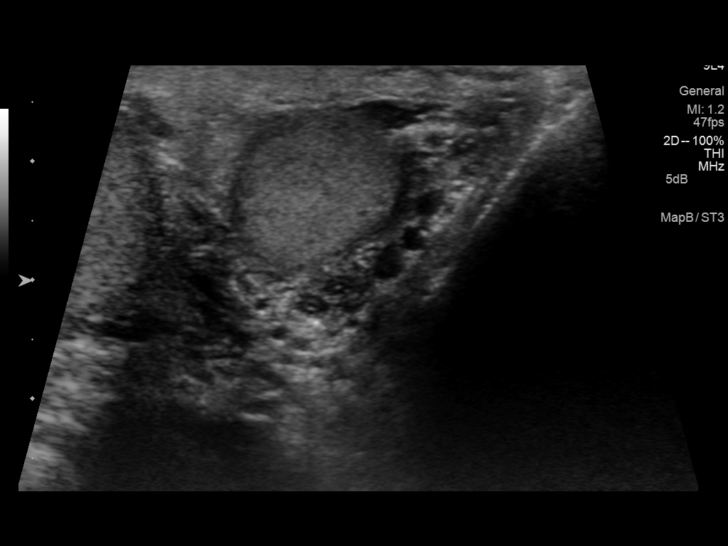
[im 61/61]
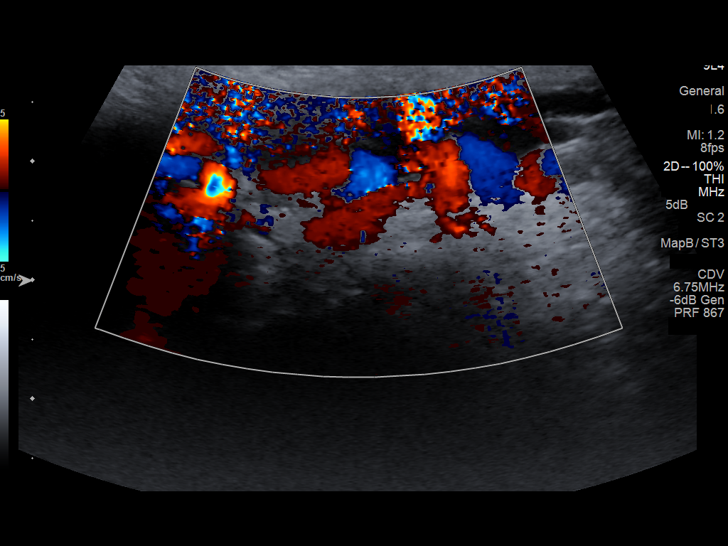

[14 of 25 positions shown; findings below may reference images not displayed]

FINDINGS: Right testicle

Measurements: 4.7 x 2.2 x 3.1 cm. No mass or microlithiasis
visualized.

Left testicle

Measurements: 5 x 2.5 x 3 cm. No mass or microlithiasis visualized.

Right epididymis: A 1 cm complicated epididymal cyst/ spermatocele
within the epididymal head is unchanged from [DATE]. The right
epididymis is otherwise unremarkable.

Left epididymis:  Normal in size and appearance.

Hydrocele: Small bilateral hydroceles noted, left greater than
right.

Varicocele:  Bilateral varicoceles are identified.
IMPRESSION: Stable 1 cm complicated right epididymal cyst/ spermatocele.

Normal testicles bilaterally.

Small bilateral hydroceles, left greater than right.

Bilateral varicoceles.

## 2013-11-04 IMAGING — US US RENAL
1 series · 14 of 25 positions shown · non-contrast
Comparison: CT Abdomen and Pelvis [DATE].

CLINICAL DATA: 62-year-old male with gross hematuria. Initial
encounter.

EXAM:
RENAL/URINARY TRACT ULTRASOUND COMPLETE

[Series 1: us renal · 0.24mm/px · 14 of 36 slices shown]
[im 1/36]
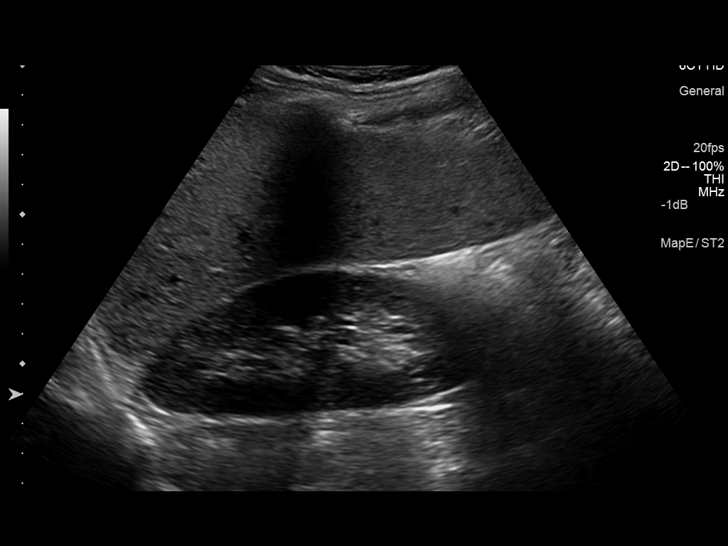
[im 3/36]
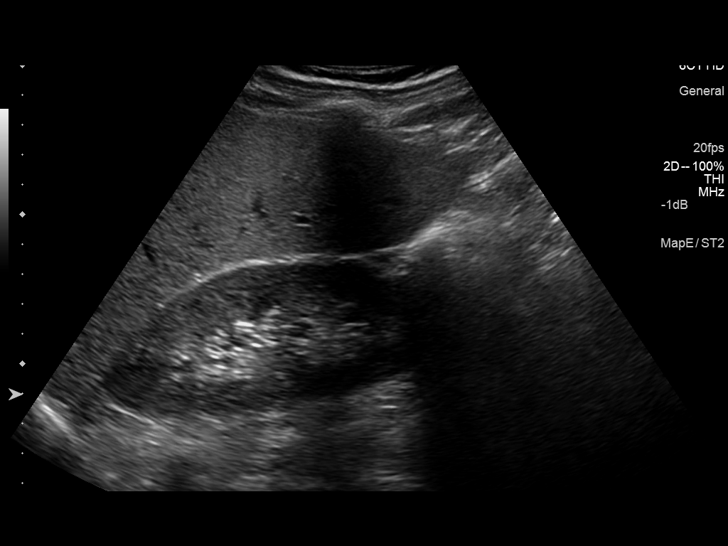
[im 6/36]
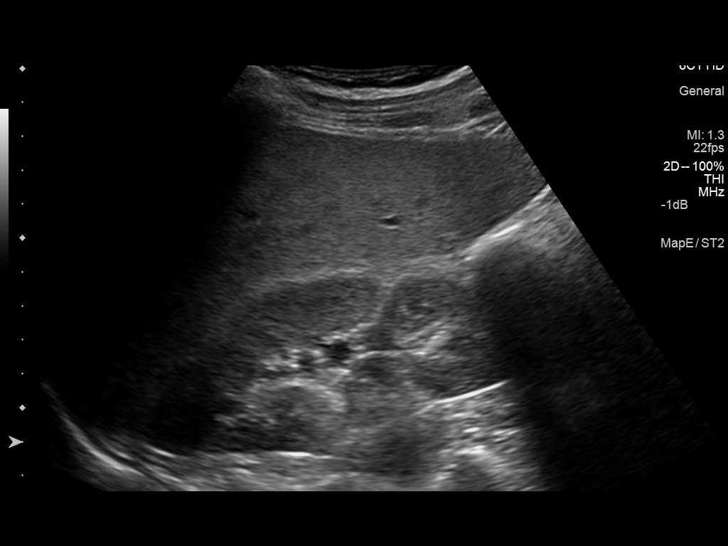
[im 9/36]
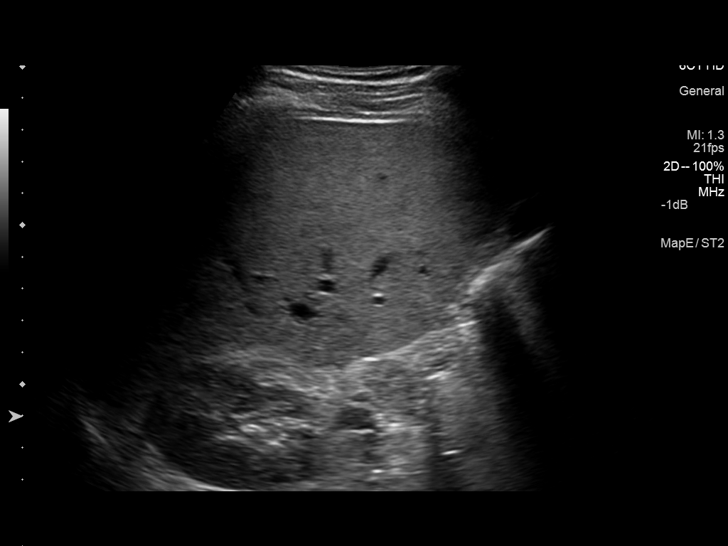
[im 12/36]
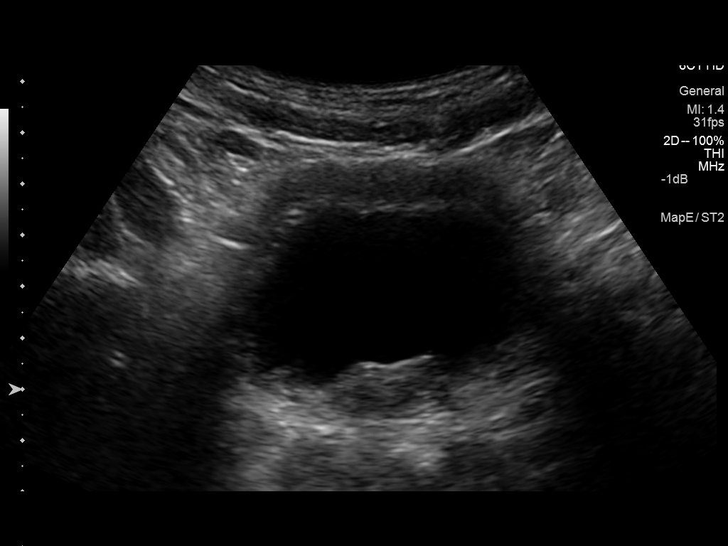
[im 14/36]
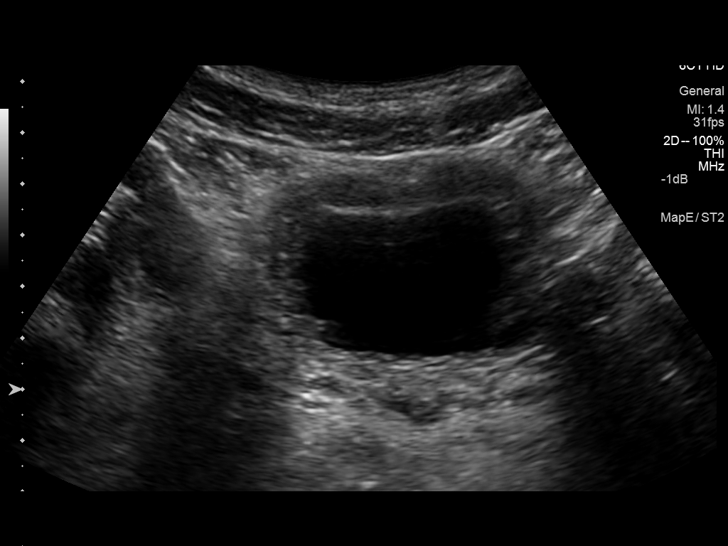
[im 17/36]
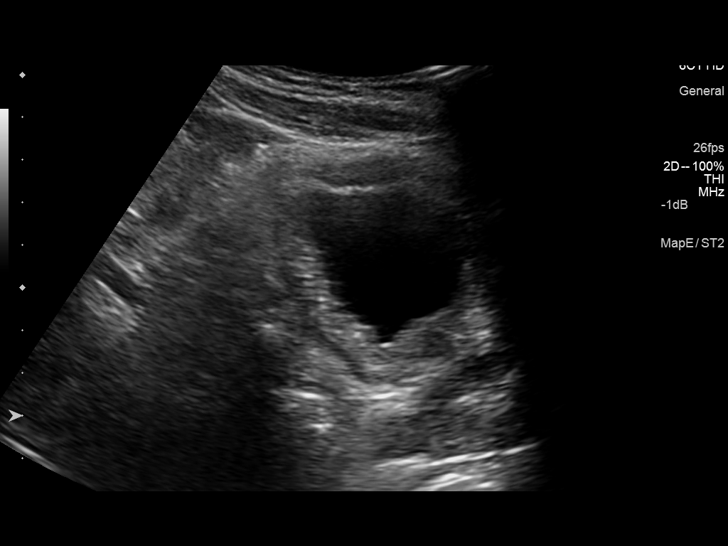
[im 19/36]
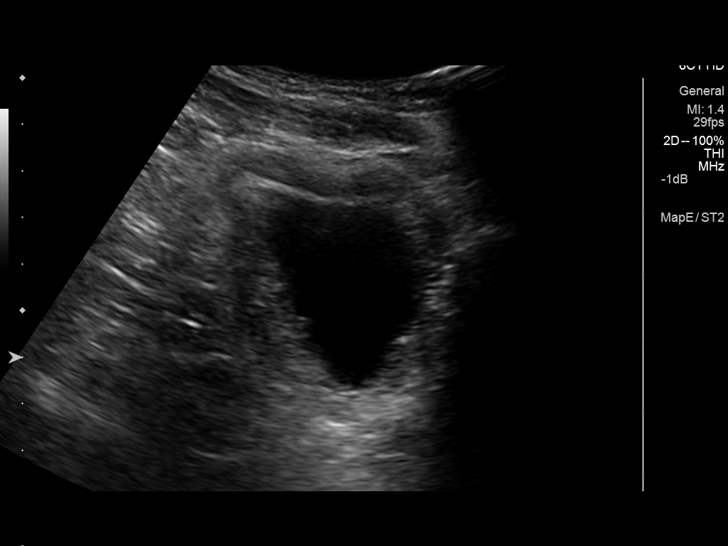
[im 22/36]
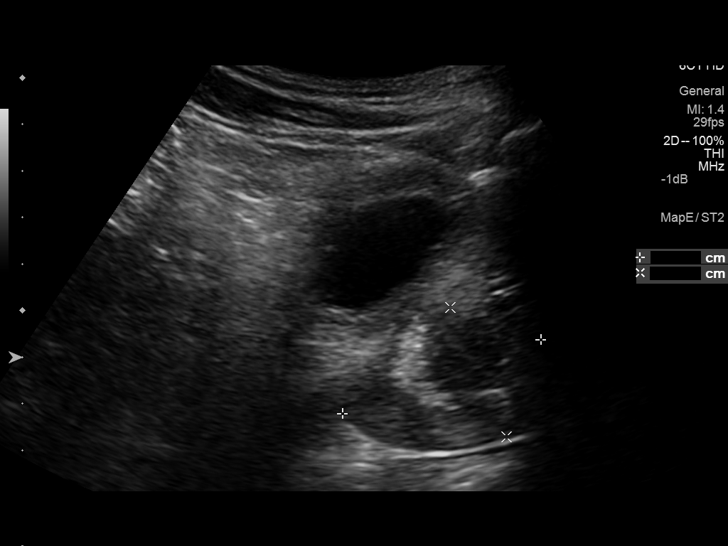
[im 24/36]
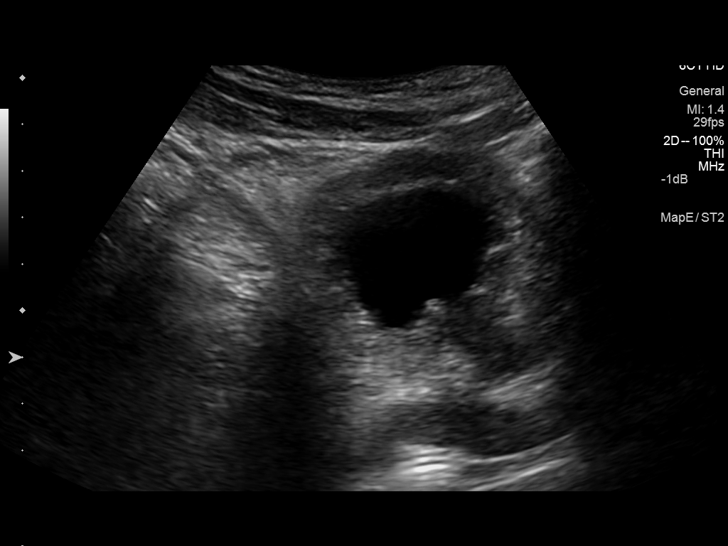
[im 27/36]
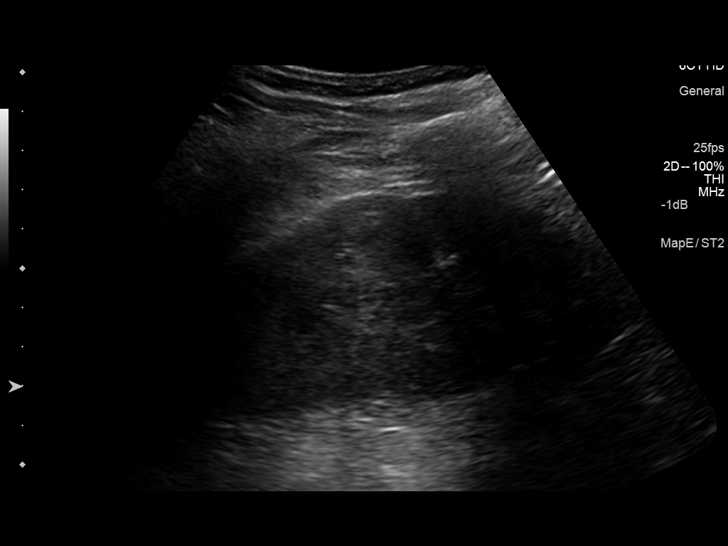
[im 30/36]
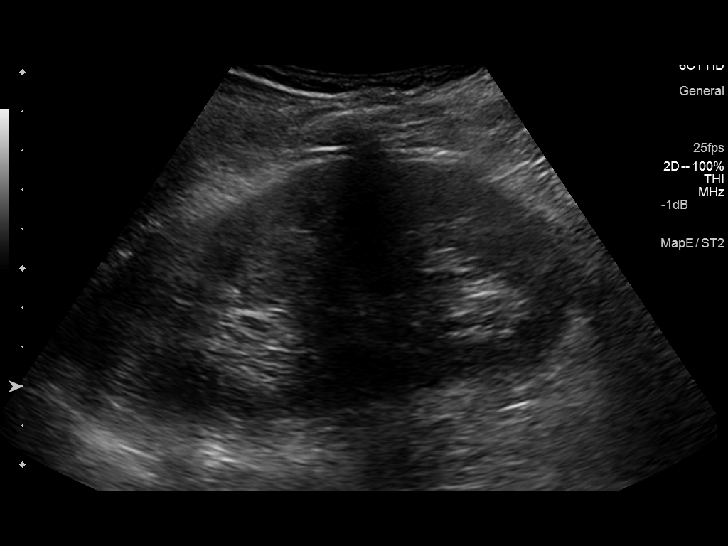
[im 33/36]
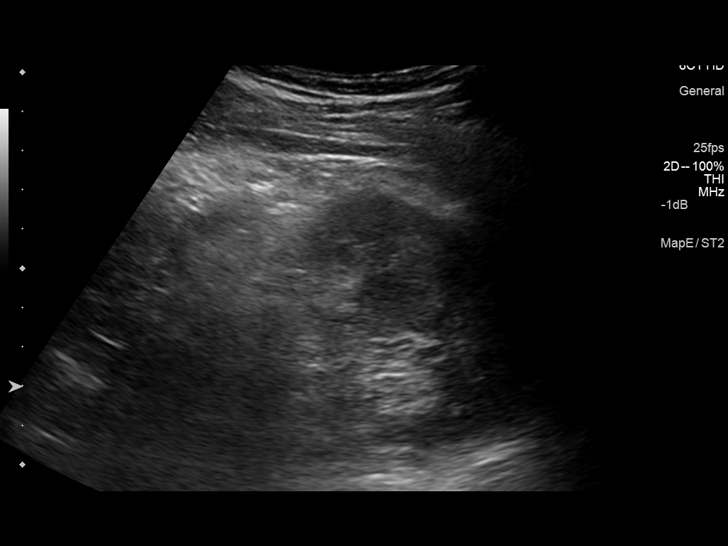
[im 36/36]
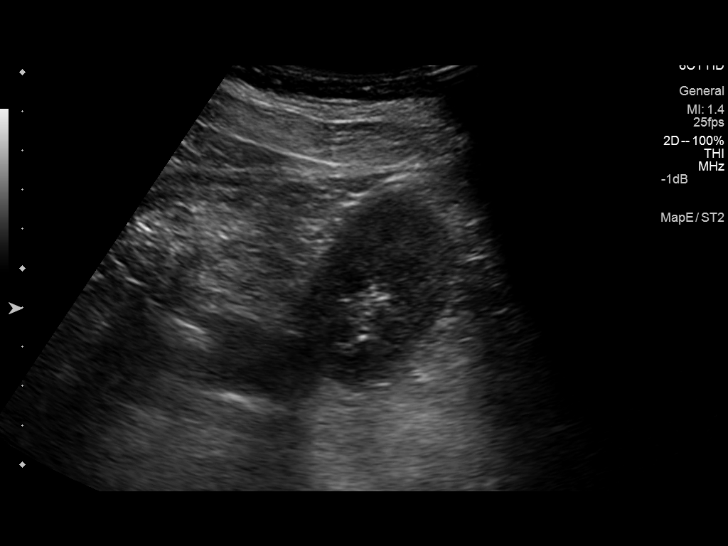

[14 of 25 positions shown; findings below may reference images not displayed]

FINDINGS: Right Kidney:

Length: 12.3 cm. Echogenicity within normal limits. No mass or
hydronephrosis visualized.

Left Kidney:

Length: 12.0 cm. Echogenicity within normal limits. No mass or
hydronephrosis visualized.

Bladder:

Decompressed, with wall thickening. Mild wall irregularity, favor
due to trabeculation. Prostate is at the upper limits of normal to
mildly enlarged at 4.6 x 3.0 x 4.7 cm. No debris identified within
the bladder.
IMPRESSION: 1. Negative sonographic appearance of the kidneys.
2. Decompressed bladder with chronic trabeculation felt to account
for wall thickening. No bladder mass identified. Query chronic
bladder outlet obstruction due to prostate hypertrophy.

## 2013-12-12 ENCOUNTER — Emergency Department (HOSPITAL_COMMUNITY): Payer: Self-pay

## 2013-12-12 ENCOUNTER — Emergency Department (HOSPITAL_COMMUNITY)
Admission: EM | Admit: 2013-12-12 | Discharge: 2013-12-12 | Disposition: A | Discharge status: Home / Self Care | Payer: Self-pay | Attending: Emergency Medicine | Admitting: Emergency Medicine

## 2013-12-12 ENCOUNTER — Encounter (HOSPITAL_COMMUNITY): Payer: Self-pay | Admitting: Emergency Medicine

## 2013-12-12 DIAGNOSIS — Z720 Tobacco use: Secondary | ICD-10-CM | POA: Insufficient documentation

## 2013-12-12 DIAGNOSIS — R030 Elevated blood-pressure reading, without diagnosis of hypertension: Secondary | ICD-10-CM | POA: Insufficient documentation

## 2013-12-12 DIAGNOSIS — Z79899 Other long term (current) drug therapy: Secondary | ICD-10-CM | POA: Insufficient documentation

## 2013-12-12 DIAGNOSIS — IMO0001 Reserved for inherently not codable concepts without codable children: Secondary | ICD-10-CM

## 2013-12-12 LAB — I-STAT CHEM 8, ED
BUN: 16 mg/dL (ref 6–23)
CREATININE: 0.8 mg/dL (ref 0.50–1.35)
Calcium, Ion: 1.09 mmol/L — ABNORMAL LOW (ref 1.13–1.30)
Chloride: 101 mEq/L (ref 96–112)
GLUCOSE: 98 mg/dL (ref 70–99)
HCT: 50 % (ref 39.0–52.0)
HEMOGLOBIN: 17 g/dL (ref 13.0–17.0)
POTASSIUM: 3.9 meq/L (ref 3.7–5.3)
Sodium: 135 mEq/L — ABNORMAL LOW (ref 137–147)
TCO2: 26 mmol/L (ref 0–100)

## 2013-12-12 IMAGING — CT CT HEAD W/O CM
1 of 2 series · 14 of 30 positions shown, 18 images · non-contrast
Comparison: [DATE]

CLINICAL DATA: Headaches for 1 month. Exposure to mold for 2
months. Decreased appetite. Elevated blood pressure.

EXAM:
CT HEAD WITHOUT CONTRAST
TECHNIQUE: Contiguous axial images were obtained from the base of the skull
through the vertex without intravenous contrast.

[Series 2: headseq 4.8 h45s · axial · 0.43mm/px · z∈[-123,+20]mm · 14 of 36 slices shown, 18 images]
[im 3/36  brain]
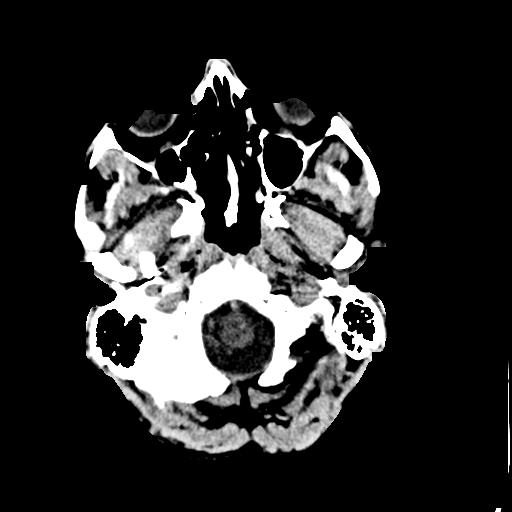
[im 3/36  bone]
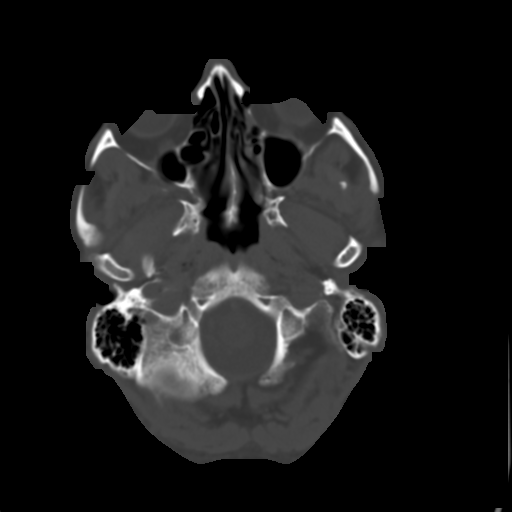
[im 5/36  brain]
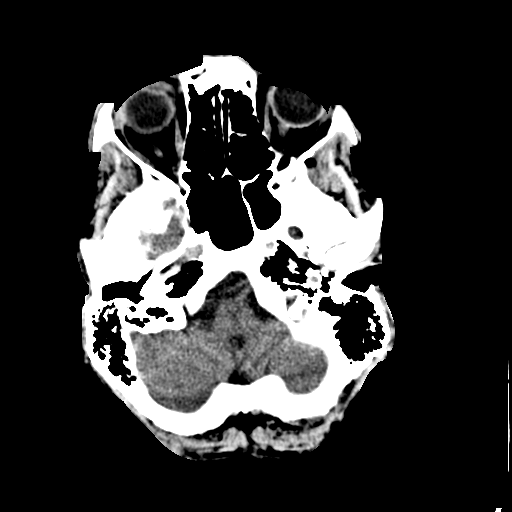
[im 8/36  brain]
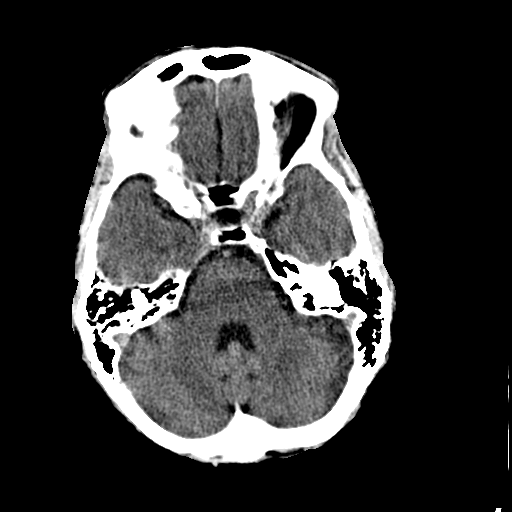
[im 10/36  brain]
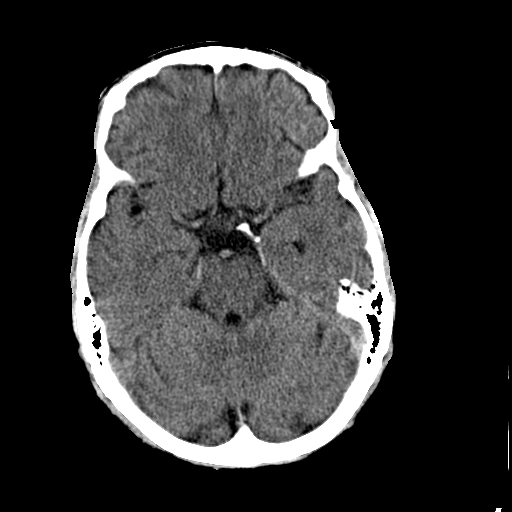
[im 12/36  brain]
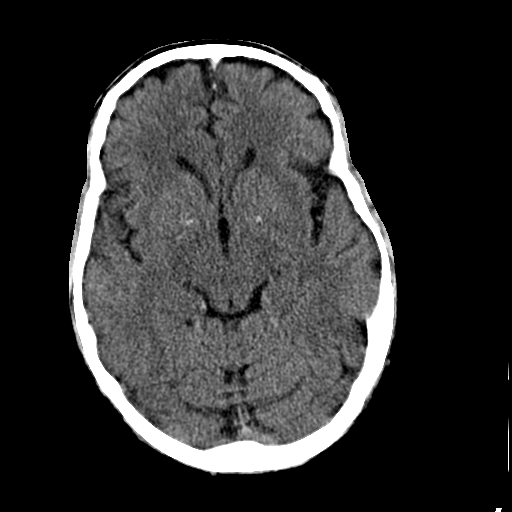
[im 12/36  bone]
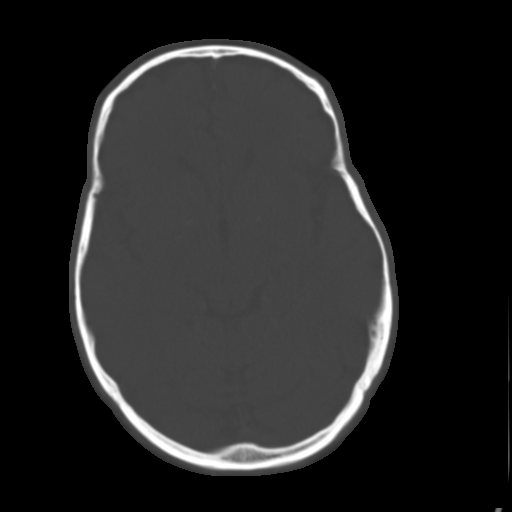
[im 15/36  brain]
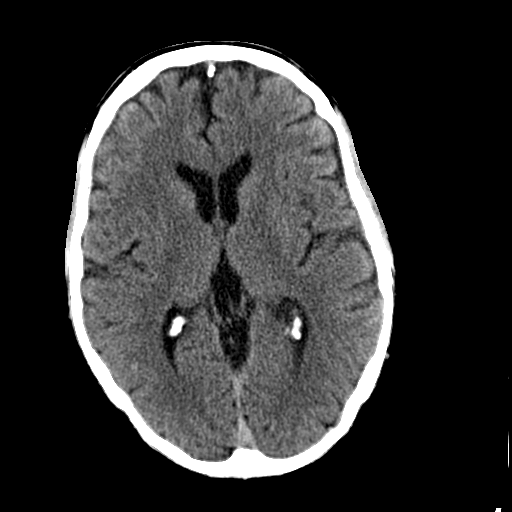
[im 17/36  brain]
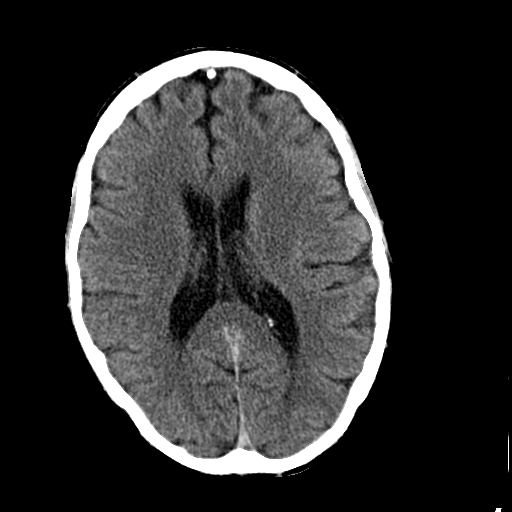
[im 19/36  brain]
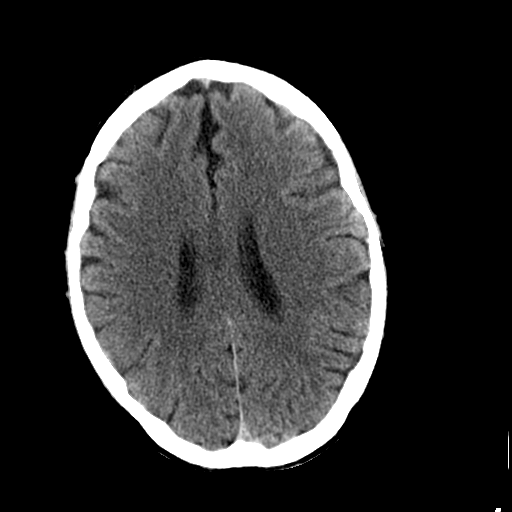
[im 22/36  brain]
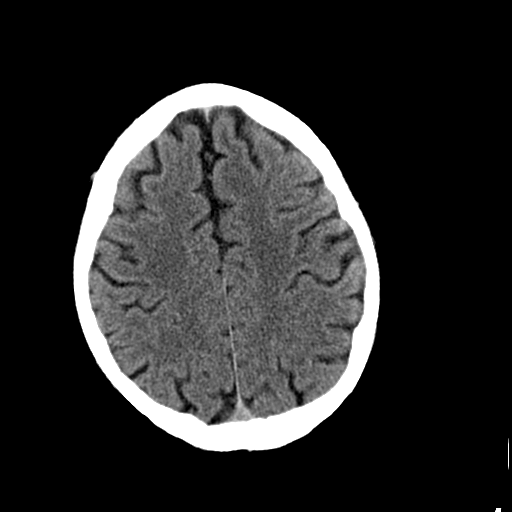
[im 22/36  bone]
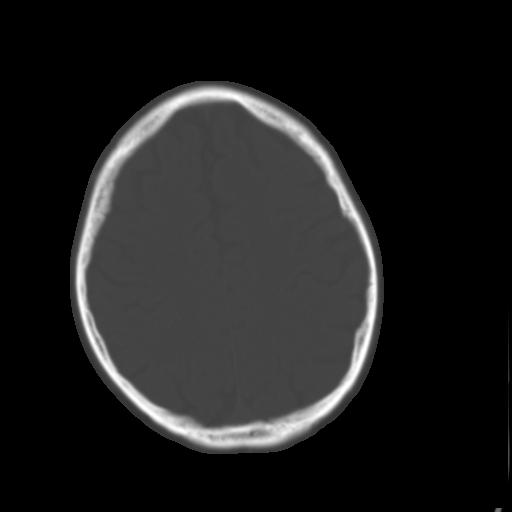
[im 24/36  brain]
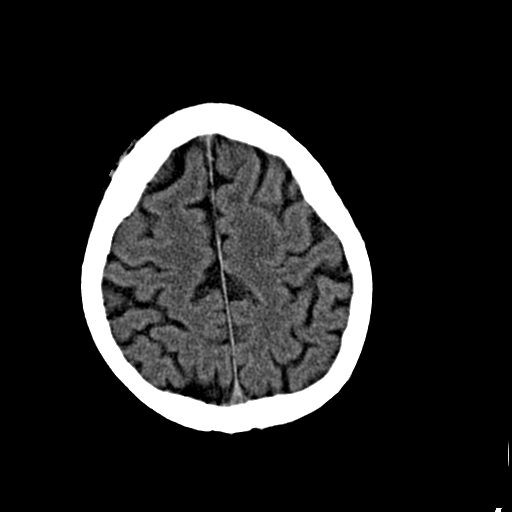
[im 26/36  brain]
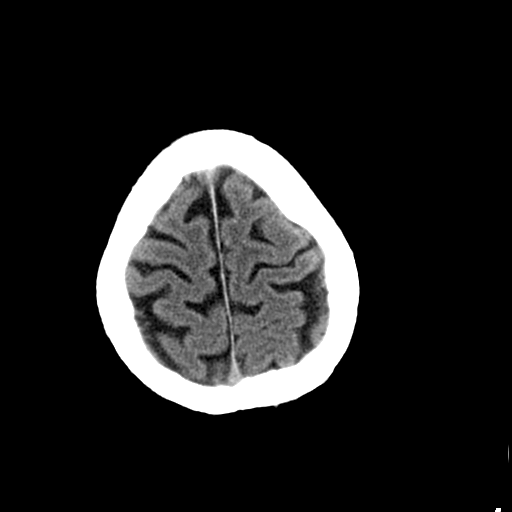
[im 29/36  brain]
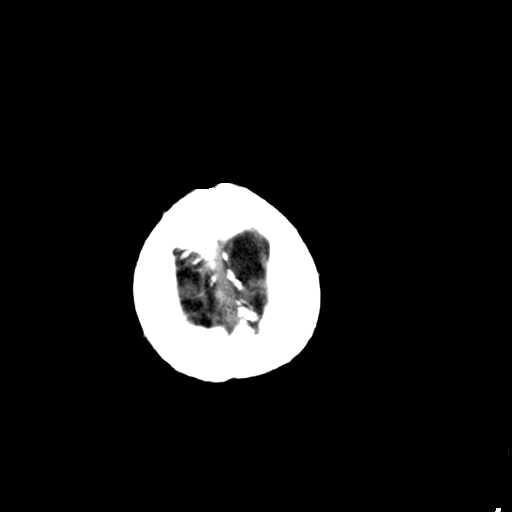
[im 31/36  brain]
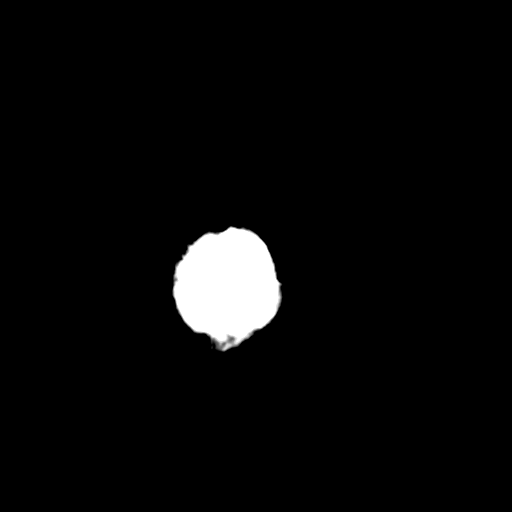
[im 31/36  bone]
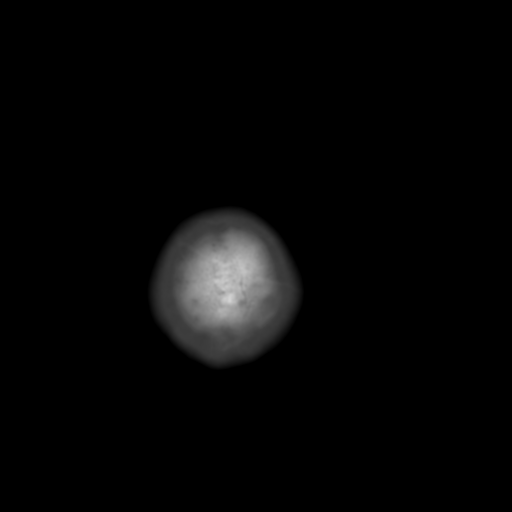
[im 33/36  brain]
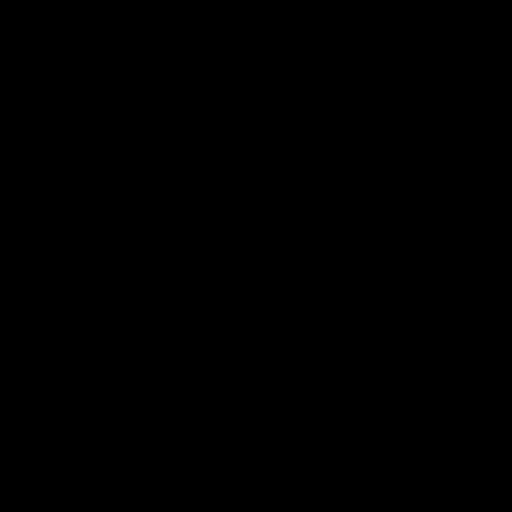

[14 of 30 positions shown; findings below may reference images not displayed]

FINDINGS: Mild diffuse cerebral atrophy. No significant ventricular dilatation
or white matter disease. No mass effect or midline shift. No
abnormal extra-axial fluid collections. Gray-white matter junctions
are distinct. Basal cisterns are not effaced. No evidence of acute
intracranial hemorrhage. No depressed skull fractures. Visualized
paranasal sinuses and mastoid air cells are not opacified.
IMPRESSION: No acute intracranial abnormalities.  Mild cerebral atrophy.

## 2013-12-12 IMAGING — CR DG CHEST 2V
2 series · 2 of 2 positions shown · non-contrast
Comparison: None.

CLINICAL DATA: Headache.

EXAM:
CHEST  2 VIEW

[w chest pa]
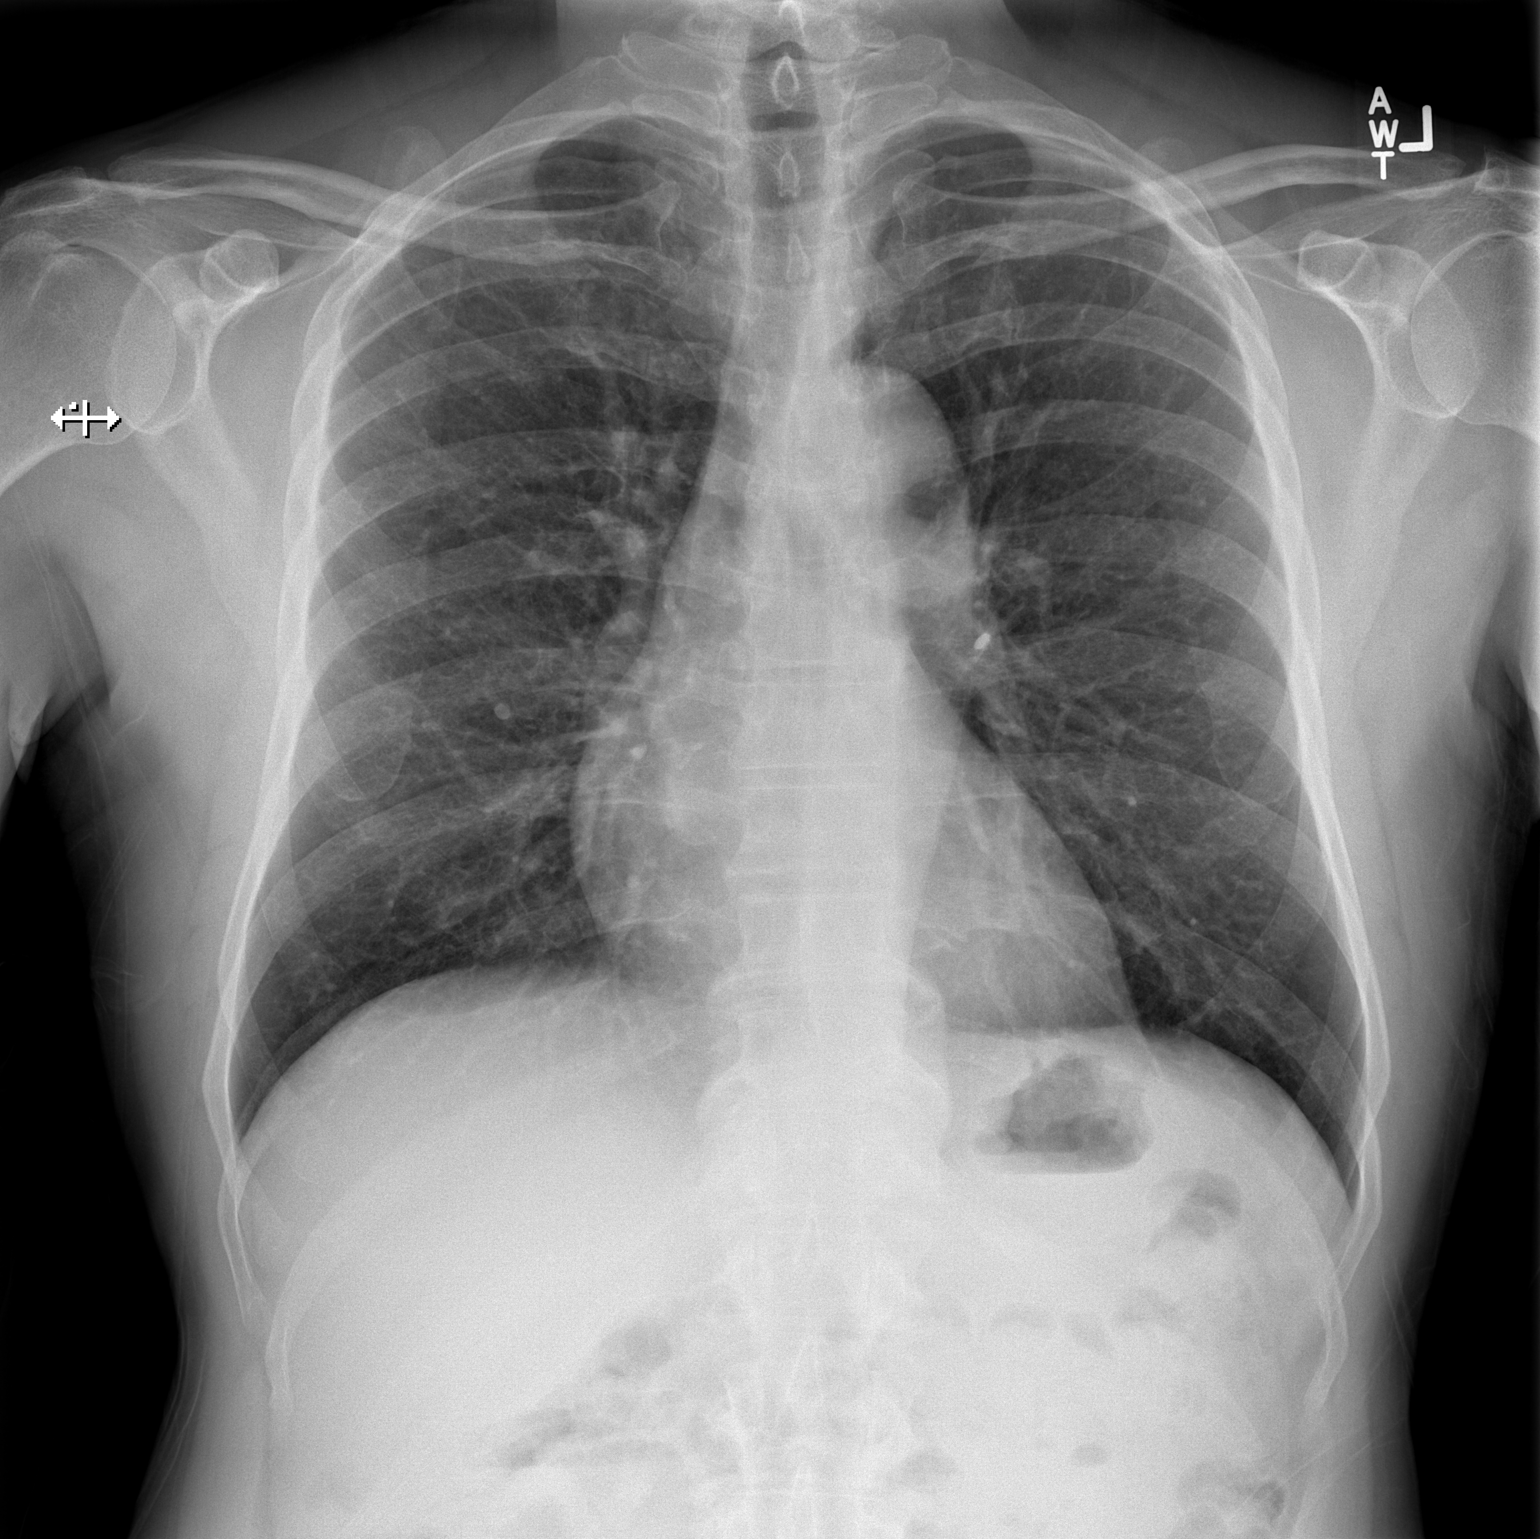

[w chest lat]
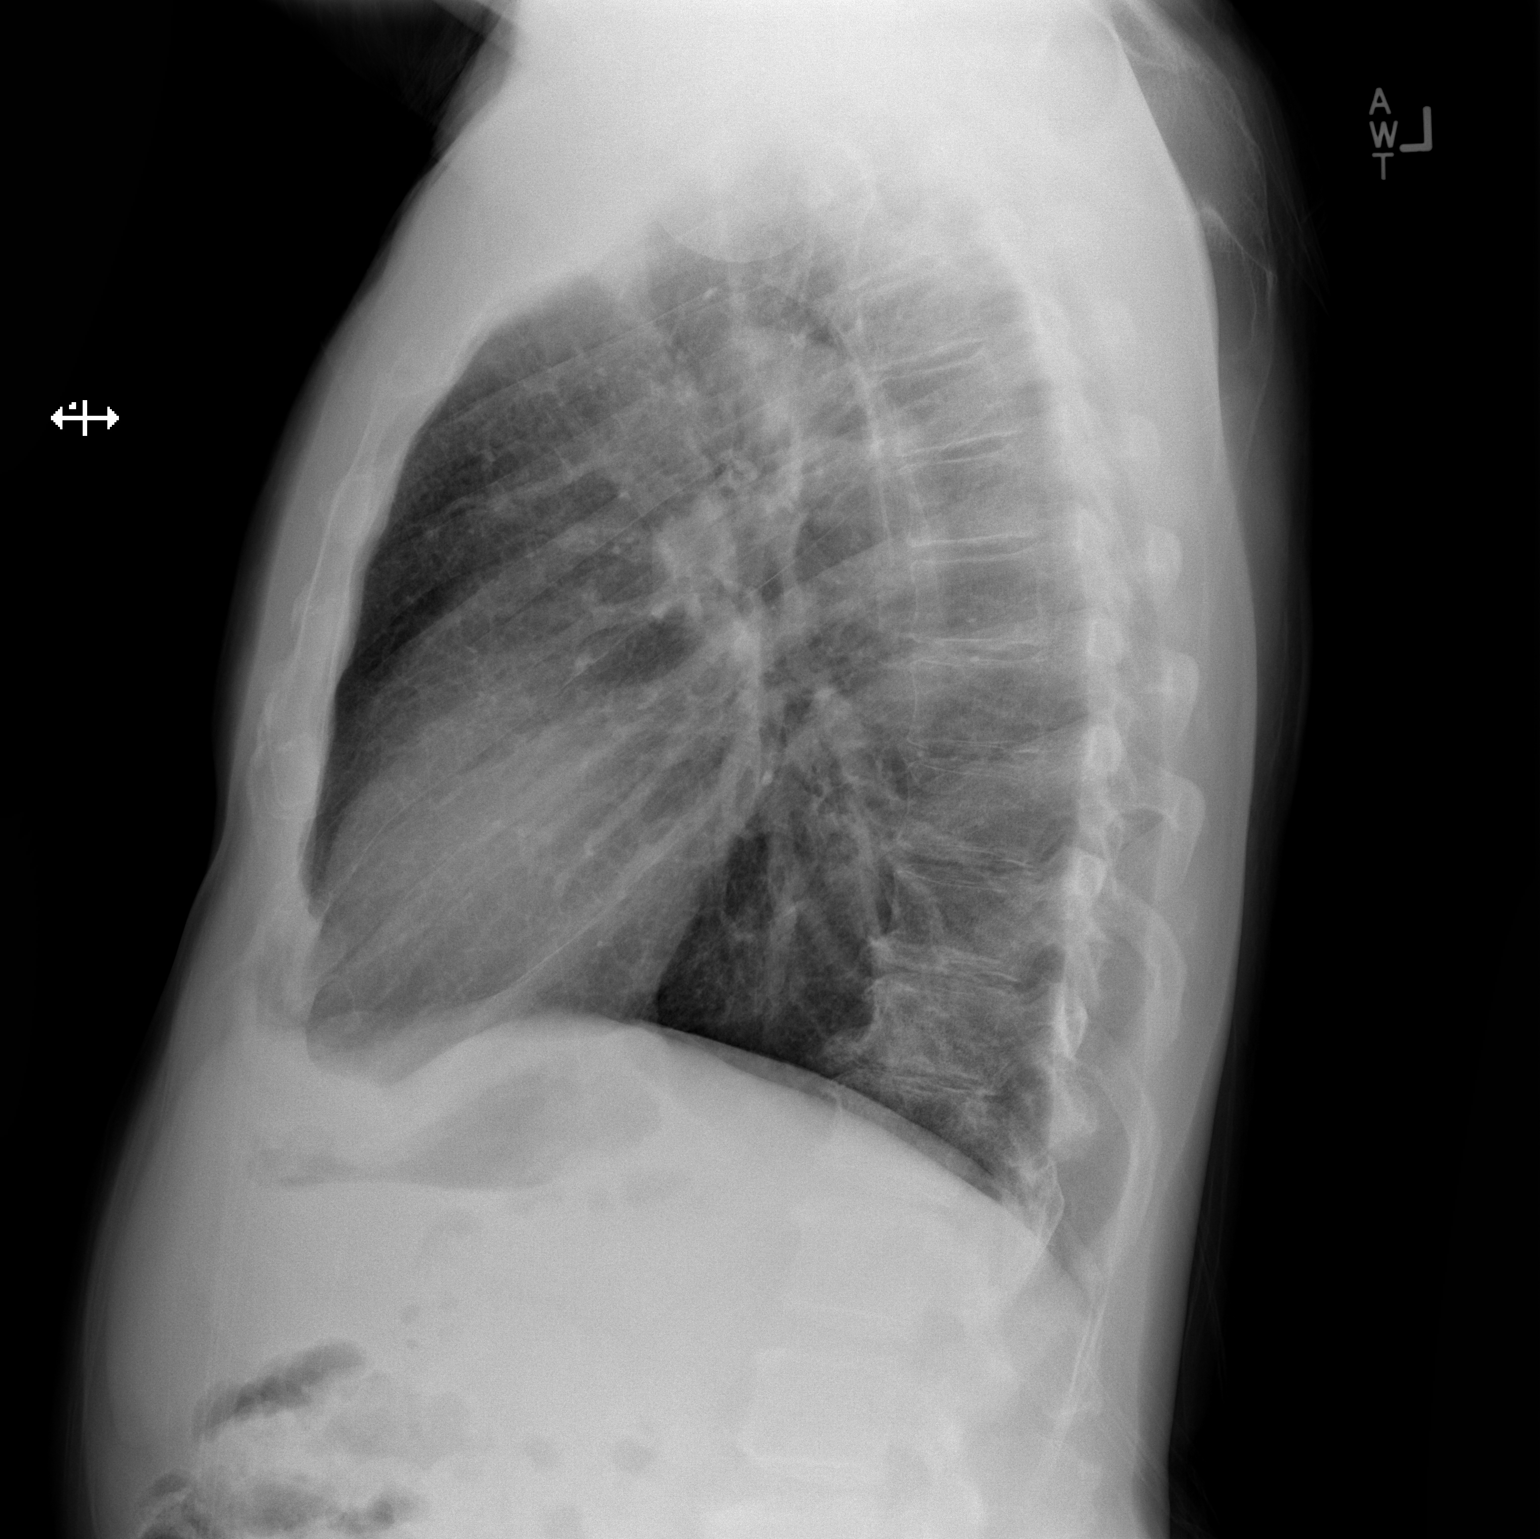

[2 of 2 positions shown; findings below may reference images not displayed]

FINDINGS: The heart size and mediastinal contours are within normal limits.
Both lungs are clear. No pneumothorax or pleural effusion is noted.
The visualized skeletal structures are unremarkable.
IMPRESSION: No acute cardiopulmonary abnormality seen.

## 2013-12-12 NOTE — ED Provider Notes (Signed)
CSN: [id number]     Arrival date & time 12/12/13  1821 History   First MD Initiated Contact with Patient 12/12/13 1919     Chief Complaint  Patient presents with  . Headache     (Consider location/radiation/quality/duration/timing/severity/associated sxs/prior Treatment) HPI Comments: Patient here complaining of headache and cough x1 month. States that he has been exposed to mold x3 months. Cough has been nonproductive and without fever or chills. No weight loss. No hemoptysis. Headache has been occipital radiating to the frontal portion of his head and without blurry vision or focal weakness. Headache has been persistent and characterized as a thumping sensation. Does have a history of high blood pressure he has not taken medication for this. No photophobia noted. No syncope or near-syncope. Symptoms persistent and nothing makes them better or worse. No treatment used prior to arrival.  Patient is a 63 y.o. male presenting with headaches. The history is provided by the patient.  Headache   Past Medical History  Diagnosis Date  . Shoulder problem     left shoulder   Past Surgical History  Procedure Laterality Date  . Hernia repair     No family history on file. History  Substance Use Topics  . Smoking status: Current Every Day Smoker -- 0.50 packs/day    Types: Cigarettes  . Smokeless tobacco: Never Used  . Alcohol Use: Yes     Comment: quart to 1.5 after work each day of beer    Review of Systems  Neurological: Positive for headaches.  All other systems reviewed and are negative.     Allergies  Aspirin and Ibuprofen  Home Medications   Prior to Admission medications   Medication Sig Start Date End Date Taking? Authorizing Provider  HYDROcodone-acetaminophen (NORCO) 5-325 MG per tablet Take 1 tablet by mouth every 6 (six) hours as needed. 01/21/13   Tatyana A Kirichenko, PA-C  HYDROcodone-acetaminophen (NORCO/VICODIN) 5-325 MG per tablet Take 1-2 tablets by mouth  every 6 (six) hours as needed. 07/20/13   Roxy Horsemanobert Browning, PA-C  varenicline (CHANTIX STARTING MONTH PAK) 0.5 MG X 11 & 1 MG X 42 tablet Take one 0.5 mg tablet by mouth once daily for 3 days, then increase to one 0.5 mg tablet twice daily for 4 days, then increase to one 1 mg tablet twice daily. 08/29/13   Doris Cheadleeepak Advani, MD   BP 153/100  Temp(Src) 98.5 F (36.9 C) (Oral)  Resp 20  SpO2 99% Physical Exam  Nursing note and vitals reviewed. Constitutional: He is oriented to person, place, and time. He appears well-developed and well-nourished.  Non-toxic appearance. No distress.  HENT:  Head: Normocephalic and atraumatic.  Eyes: Conjunctivae, EOM and lids are normal. Pupils are equal, round, and reactive to light.  Neck: Normal range of motion. Neck supple. No tracheal deviation present. No mass present.  Cardiovascular: Normal rate, regular rhythm and normal heart sounds.  Exam reveals no gallop.   No murmur heard. Pulmonary/Chest: Effort normal and breath sounds normal. No stridor. No respiratory distress. He has no decreased breath sounds. He has no wheezes. He has no rhonchi. He has no rales.  Abdominal: Soft. Normal appearance and bowel sounds are normal. He exhibits no distension. There is no tenderness. There is no rebound and no CVA tenderness.  Musculoskeletal: Normal range of motion. He exhibits no edema and no tenderness.  Neurological: He is alert and oriented to person, place, and time. He has normal strength. No cranial nerve deficit or sensory deficit. GCS eye  subscore is 4. GCS verbal subscore is 5. GCS motor subscore is 6.  Skin: Skin is warm and dry. No abrasion and no rash noted.  Psychiatric: He has a normal mood and affect. His speech is normal and behavior is normal.    ED Course  Procedures (including critical care time) Labs Review Labs Reviewed  I-STAT CHEM 8, ED    Imaging Review No results found.   EKG Interpretation None      MDM   Final diagnoses:   None    Patient's lab results reviewed with him. No acute neurological findings noted. Chest x-ray stable. Hypertension noted and patient informed to followup with his doctor.    Toy BakerAnthony T Kashtyn, MD 12/12/13 2152

## 2013-12-12 NOTE — ED Notes (Signed)
Pt reports h/a x 1 month, reports exposure to mold x 2 months.  Pt reports decreased appetite and elevated BP.  Denies hx of HTN.

## 2013-12-12 NOTE — Discharge Instructions (Signed)
Hypertension °Hypertension, commonly called high blood pressure, is when the force of blood pumping through your arteries is too strong. Your arteries are the blood vessels that carry blood from your heart throughout your body. A blood pressure reading consists of a higher number over a lower number, such as 110/72. The higher number (systolic) is the pressure inside your arteries when your heart pumps. The lower number (diastolic) is the pressure inside your arteries when your heart relaxes. Ideally you want your blood pressure below 120/80. °Hypertension forces your heart to work harder to pump blood. Your arteries may become narrow or stiff. Having hypertension puts you at risk for heart disease, stroke, and other problems.  °RISK FACTORS °Some risk factors for high blood pressure are controllable. Others are not.  °Risk factors you cannot control include:  °· Race. You may be at higher risk if you are African American. °· Age. Risk increases with age. °· Gender. Men are at higher risk than women before age 45 years. After age 65, women are at higher risk than men. °Risk factors you can control include: °· Not getting enough exercise or physical activity. °· Being overweight. °· Getting too much fat, sugar, calories, or salt in your diet. °· Drinking too much alcohol. °SIGNS AND SYMPTOMS °Hypertension does not usually cause signs or symptoms. Extremely high blood pressure (hypertensive crisis) may cause headache, anxiety, shortness of breath, and nosebleed. °DIAGNOSIS  °To check if you have hypertension, your health care provider will measure your blood pressure while you are seated, with your arm held at the level of your heart. It should be measured at least twice using the same arm. Certain conditions can cause a difference in blood pressure between your right and left arms. A blood pressure reading that is higher than normal on one occasion does not mean that you need treatment. If one blood pressure reading  is high, ask your health care provider about having it checked again. °TREATMENT  °Treating high blood pressure includes making lifestyle changes and possibly taking medicine. Living a healthy lifestyle can help lower high blood pressure. You may need to change some of your habits. °Lifestyle changes may include: °· Following the DASH diet. This diet is high in fruits, vegetables, and whole grains. It is low in salt, red meat, and added sugars. °· Getting at least 2½ hours of brisk physical activity every week. °· Losing weight if necessary. °· Not smoking. °· Limiting alcoholic beverages. °· Learning ways to reduce stress. ° If lifestyle changes are not enough to get your blood pressure under control, your health care provider may prescribe medicine. You may need to take more than one. Work closely with your health care provider to understand the risks and benefits. °HOME CARE INSTRUCTIONS °· Have your blood pressure rechecked as directed by your health care provider.   °· Take medicines only as directed by your health care provider. Follow the directions carefully. Blood pressure medicines must be taken as prescribed. The medicine does not work as well when you skip doses. Skipping doses also puts you at risk for problems.   °· Do not smoke.   °· Monitor your blood pressure at home as directed by your health care provider.  °SEEK MEDICAL CARE IF:  °· You think you are having a reaction to medicines taken. °· You have recurrent headaches or feel dizzy. °· You have swelling in your ankles. °· You have trouble with your vision. °SEEK IMMEDIATE MEDICAL CARE IF: °· You develop a severe headache or confusion. °·   You have unusual weakness, numbness, or feel faint. °· You have severe chest or abdominal pain. °· You vomit repeatedly. °· You have trouble breathing. °MAKE SURE YOU:  °· Understand these instructions. °· Will watch your condition. °· Will get help right away if you are not doing well or get worse. °Document  Released: 02/10/2005 Document Revised: 06/27/2013 Document Reviewed: 12/03/2012 °ExitCare® Patient Information ©2015 [employer]. This information is not intended to replace advice given to you by your health care provider. Make sure you discuss any questions you have with your health care provider. ° ° °Emergency Department Resource Guide °1) Find a Doctor and Pay Out of Pocket °Although you won't have to find out who is covered by your insurance plan, it is a good idea to ask around and get recommendations. You will then need to call the office and see if the doctor you have chosen will accept you as a new patient and what types of options they offer for patients who are self-pay. Some doctors offer discounts or will set up payment plans for their patients who do not have insurance, but you will need to ask so you aren't surprised when you get to your appointment. ° °2) Contact Your Local Health Department °Not all health departments have doctors that can see patients for sick visits, but many do, so it is worth a call to see if yours does. If you don't know where your local health department is, you can check in your phone book. The CDC also has a tool to help you locate your state's health department, and many state websites also have listings of all of their local health departments. ° °3) Find a Walk-in Clinic °If your illness is not likely to be very severe or complicated, you may want to try a walk in clinic. These are popping up all over the country in pharmacies, drugstores, and shopping centers. They're usually staffed by nurse practitioners or physician assistants that have been trained to treat common illnesses and complaints. They're usually fairly quick and inexpensive. However, if you have serious medical issues or chronic medical problems, these are probably not your best option. ° °No Primary Care Doctor: °- Call [facility] at  832-8000 - they can help you locate a primary care doctor that   accepts your insurance, provides certain services, etc. °- Physician Referral Service- 1-800-533-3463 ° °Chronic Pain Problems: °Organization         Address  Phone   Notes  °[facility]  (336) 297-2271 Patients need to be referred by their primary care doctor.  ° °Medication Assistance: °Organization         Address  Phone   Notes  °[place] [facility] [address] °[place], [place] [address] (336) 641-8030 --Must be a resident of [place] °-- Must have NO insurance coverage whatsoever (no Medicaid/ Medicare, etc.) °-- The pt. MUST have a primary care doctor that directs their care regularly and follows them in the community °  °[facility]  (866) 331-1348   °[employer]  (888) 892-1162   ° °Agencies that provide inexpensive medical care: °Organization         Address  Phone   Notes  °[facility]  (336) 832-8035   °[facility]    (336) 832-7272   °[facility] [address] °[place], [place] [address] (336) 832-4777   °[facility] [address], °  [place] (336) 271-4999   °[facility]    (336) 373-0678   °[facility]    (336) 272-1050   °[facility] ° [address], [place] Phone:  (336) 832-4444, Fax:  (336) 832-4440 Hours of Operation:  9 am - 6 pm, M-F.  Also accepts Medicaid/Medicare and self-pay.  °[facility] ° [address], [place] Phone: (336) 832-3150, Fax: (336) [phone]. Hours of Operation:  8:30 am - 5:30 pm, M-F.  Also accepts Medicaid and self-pay.  °[facility] [place] [address], [place] Phone: (336) 878-6027   °[facility] [address], [place], [place] (336)723-1848, Ext. 123 Mondays & Thursdays: 7-9 AM.  First 15 patients are seen on a first come, first serve basis. °  ° °Medicaid-accepting [place] Providers: ° °Organization          Address  Phone   Notes  °[facility] [address], [place] (336) 641-2100 Also accepts self-pay patients.  °[facility] [address], [place] ° (336) 856-9996   °[facility] [address], [place] (336) 288-8857   °[facility] [address] [place] [address], [place] (336) 299-7000   °Veita Bland [address], [place]  ° (336) 373-1557 Only accepts [place] Access Medicaid patients after they have their name applied to their card.  ° °Self-Pay (no insurance) in [place]: ° °Organization         Address  Phone   Notes  °Sickle Cell Patients, [facility] [address], [place] (336) 832-1970   °[facility] [address], [place] (336) 832-4400   ° [facility] ° [address], [place] (336) 992-4800   °[facility]/Dr. Osei-Bonsu ° [address] [place] [address], [place] or [address], [place] (336) [phone] Phone number for both [place] and [place] locations is the same.  °[facility] [address] [place] [address], [place] (336) 299-0000   °[facility] [address] [place] [address], [place] or [address] (336) 852-7530 °(336) 878-2260   °[facility] [address], [place] (336) 350-1642, phone; (336) 294-5005, fax Sees patients 1st and 3rd Saturday of every month.  Must not qualify for public or private insurance (i.e. Medicaid, Medicare, [place] Health Choice, Veterans' Benefits) • Household income should be no more than 200% of the poverty level •The clinic cannot treat you if you are pregnant or think you are pregnant • Sexually transmitted diseases are not treated at the clinic.  ° ° °Dental Care: °Organization         Address  Phone  Notes  °[facility] [address],   (336) 641-6152 Accepts children up to age 21 who are enrolled in Medicaid or [place] Health Choice; pregnant women with a Medicaid card; and children who have applied for Medicaid or [place] Health Choice, but were declined, whose parents can pay a reduced fee at time of service.  °[facility] [place]  [address], [place] (336) 641-7733 Accepts children up to age 21 who are enrolled in Medicaid or [place] Health Choice; pregnant women with a Medicaid card; and children who have applied for Medicaid or    Health Choice, but were declined, whose parents can pay a reduced fee at time of service.  °[facility] ° [address], [place] (336) 641-4533 Patients are seen by appointment only. Walk-ins are not accepted. [facility] will see patients 18 years of age and older. °Monday - Tuesday (8am-5pm) °Most Wednesdays (8:30-5pm) °$30 per visit, cash only  °[facility] ° [address], [place] (336) [phone] Patients are seen by appointment only. Walk-ins are not accepted. [facility] will see patients 18 years of age and older. °One Wednesday Evening (Monthly: Volunteer Based).  $30 per visit, cash only  °[facility]  (919) 537-3737 for adults; Children under age 4, call Graduate Pediatric Dentistry at ([phone]) [phone]. Children aged 4-14, please call (919) [phone] to request a pediatric application. ° Dental services are provided in all areas of dental care including fillings, crowns and bridges, complete and partial dentures, implants, gum treatment, root canals, and extractions. Preventive care is also provided. Treatment is provided to both adults and children. °Patients are selected via a lottery and there is often a waiting list. °  °[facility] [address], °[place] ° (336) 763-8833 [email or web address] °  °[facility] [address], [place], [place]  (336)723-1848, Ext. 123 Second and Fourth Thursday of each month, opens at 6:30 AM; Clinic ends at 9 AM.  Patients are seen on a first-come first-served basis, and a limited number are seen during each clinic.  ° °[facility] ° [address], [place], [place] (336) 723-7904   Eligibility Requirements °You must have lived in [place], [place], or [place] counties for at least the last three months. °  You cannot be eligible for state or federal sponsored healthcare insurance, including Veterans Administration, Medicaid, or Medicare. °  You generally cannot be eligible for healthcare insurance through your employer.  °  How to apply: °Eligibility screenings are held every Tuesday and Wednesday afternoon from 1:00 pm until 4:00 pm. You do not need an appointment for the interview!  °[facility] [address], [place], [place] 336-631-2330   °[facility]  336-342-8273   °[facility]  336-703-3100   °[facility]  336-570-6415   ° °Behavioral Health Resources in the Community: °Intensive Outpatient Programs °Organization         Address  Phone  Notes  °[facility] [address], [place], [place] 336-878-6098   °[facility] [address], [place], [place] 336-832-9800   °[facility] [address], [place], [place] ° 336-882-2125   °[facility] [address],  °[place], [place] 1-800-853-5163 or 336-641-4981   °Substance Abuse Resources °Organization         Address  Phone  Notes  °[facility]  336-882-2125   °[facility]  336-784-9470   °[facility]  336-285-9073   °[facility]  336-845-3988   °Residential & Outpatient Substance Abuse Program  1-800-659-3381   °Psychological Services °Organization         Address  Phone  Notes  °[facility]  336- 832-9600   °[facility]  336- 378-7881    °[facility] [address], [place] 1-800-853-5163 or 336-641-4981   ° °Mobile Crisis Teams °Organization         Address  Phone    Notes  °[facility], Mobile Crisis Care Unit  1-877-626-1772   °Assertive °[facility] ° [address] [place], [place] 336-834-9664   °Sharon DeEsch [address] °[place] 336-554-5454   ° °Self-Help/Support Groups °Organization         Address  Phone             Notes  °[facility] - variety of support groups  336- 373-1402 Call for more information  °Narcotics Anonymous (NA), [facility] [address], °[place]  2 meetings at this location  ° °Residential Treatment Programs °Organization         Address  Phone  Notes  °[facility] [address],    °[place]  1-866-801-8205   °[facility] ° [address], [place], [place] 704-293-8524   °[facility] [address], [place] 336-845-3988 Admissions: 8am-3pm M-F  °[facility] [address]    °[place], [place] 336-841-1104   °[facility] [address], [place], [place] 336-379-7146   °[facility] [address]  °[place], [place] 336-285-9073   °[facility] - Intensive Outpatient [address], [place], [place] 336-852-3033   °[facility] [address]  °[place], [place] 1-877-615-2722 or 336-784-9470   °[facility] [address] [place], [place] 336-227-7417 Accepts Medicaid  °[facility] [address]  °[place] 1-800-659-3381 Substance Abuse/Addiction Treatment  ° °[place] Behavioral Health Resources °Organization         Address  Phone  Notes  °[facility]  (888) 581-9988   °Julie Brannon, PhD [address] [place], [place]   (336) 349-5553 or (336) 951-0000   °[facility]   [address] °[place], [place] (336) 349-4454   °[facility] [address], [place], [place] (336) 342-8316 Insurance/Medicaid/sponsorship through [facility]  °[facility] [address]                                    [place], [place] (336) 342-8316 Therapy/tele-psych/case  °[facility] [address]  ° [place], [place] (336) 349-2233    °Dr. Arfeen  (336) 349-4544   °[facility] [place]  [employer] [place] [facility] 1) [address], [place] °2) [address], [place] °3)  [address], [place] (336) 349-3220 °(336) 342-7768 ° °(336) 342-8140   °[place] [facility] (336) 342-1394 or (336) 342-3537 (After Hours)    ° ° ° °

## 2014-01-09 ENCOUNTER — Encounter (HOSPITAL_COMMUNITY): Payer: Self-pay | Admitting: Emergency Medicine

## 2014-01-09 ENCOUNTER — Emergency Department (HOSPITAL_COMMUNITY): Payer: Self-pay

## 2014-01-09 ENCOUNTER — Emergency Department (HOSPITAL_COMMUNITY)
Admission: EM | Admit: 2014-01-09 | Discharge: 2014-01-09 | Disposition: A | Discharge status: Home / Self Care | Payer: Self-pay | Attending: Emergency Medicine | Admitting: Emergency Medicine

## 2014-01-09 DIAGNOSIS — R200 Anesthesia of skin: Secondary | ICD-10-CM | POA: Insufficient documentation

## 2014-01-09 DIAGNOSIS — M5030 Other cervical disc degeneration, unspecified cervical region: Secondary | ICD-10-CM | POA: Insufficient documentation

## 2014-01-09 DIAGNOSIS — M549 Dorsalgia, unspecified: Secondary | ICD-10-CM

## 2014-01-09 DIAGNOSIS — M546 Pain in thoracic spine: Secondary | ICD-10-CM | POA: Insufficient documentation

## 2014-01-09 IMAGING — CR DG CERVICAL SPINE COMPLETE 4+V
7 series · 7 of 7 positions shown · non-contrast
Comparison: No priors.

CLINICAL DATA: 62-year-old male with 2 day history of right-sided
neck pain.

EXAM:
CERVICAL SPINE  4+ VIEWS

[w cervical spine lat]
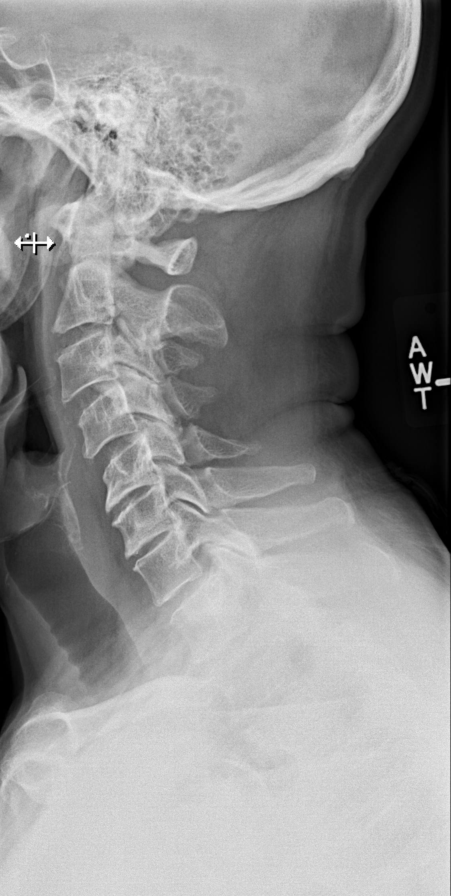

[w cervical spine ap_obl (1 of 2)]
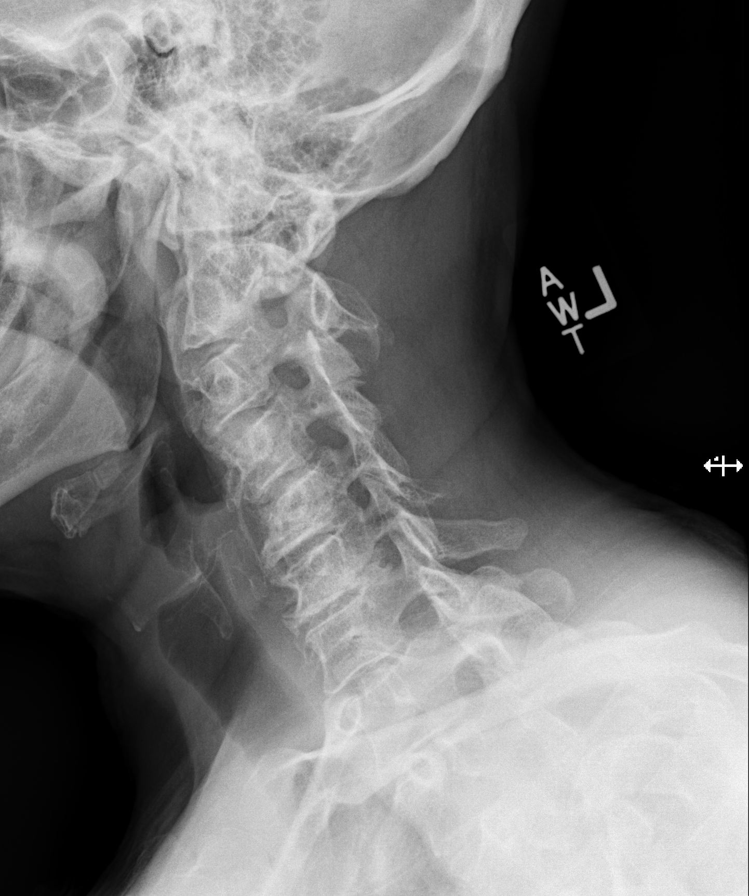

[w cervical spine ap_obl (2 of 2)]
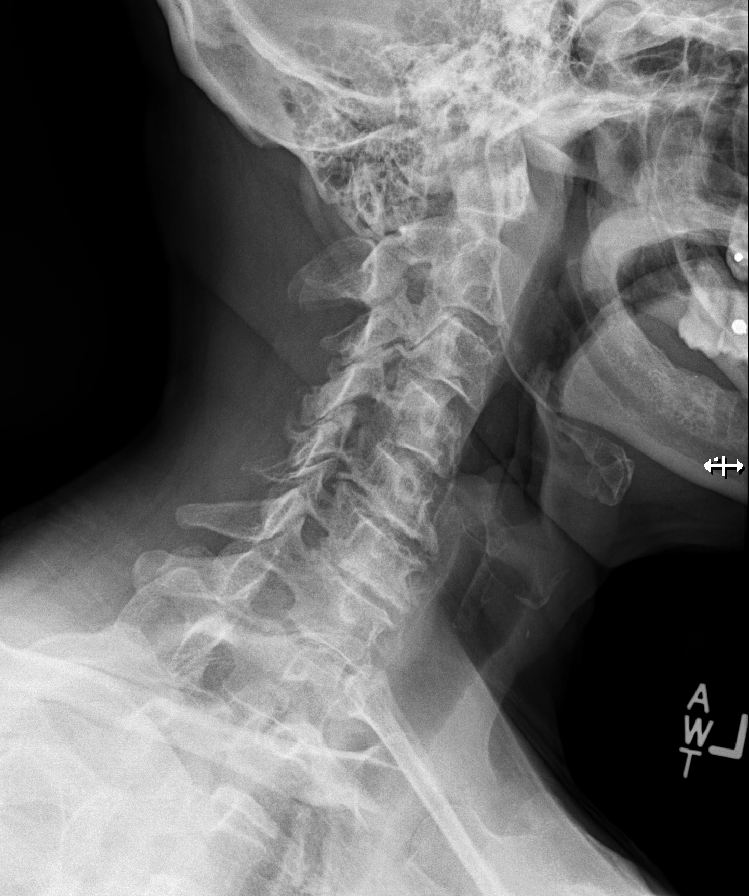

[w cervical spine ap]
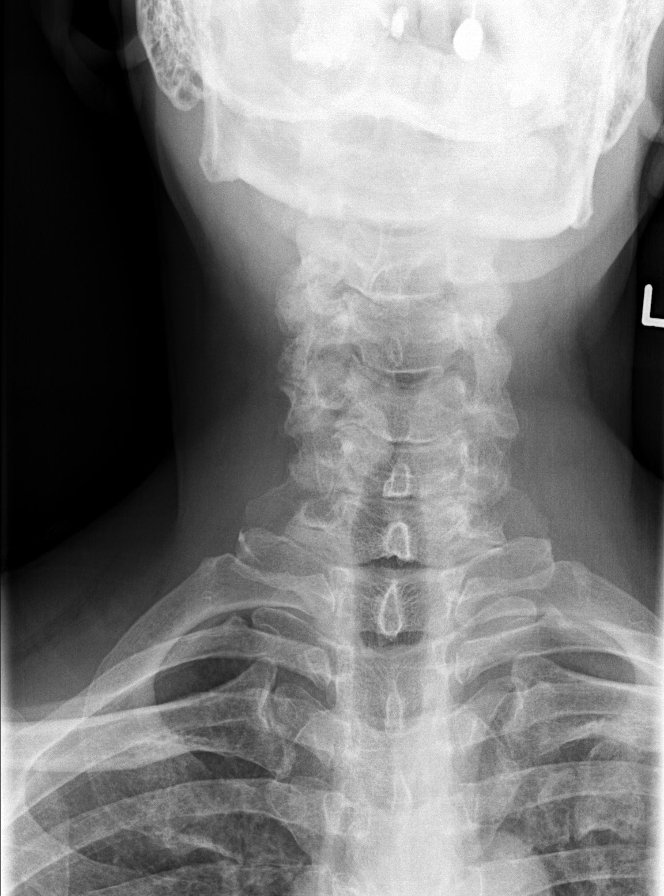

[w cervical spine odontoid (1 of 2)]
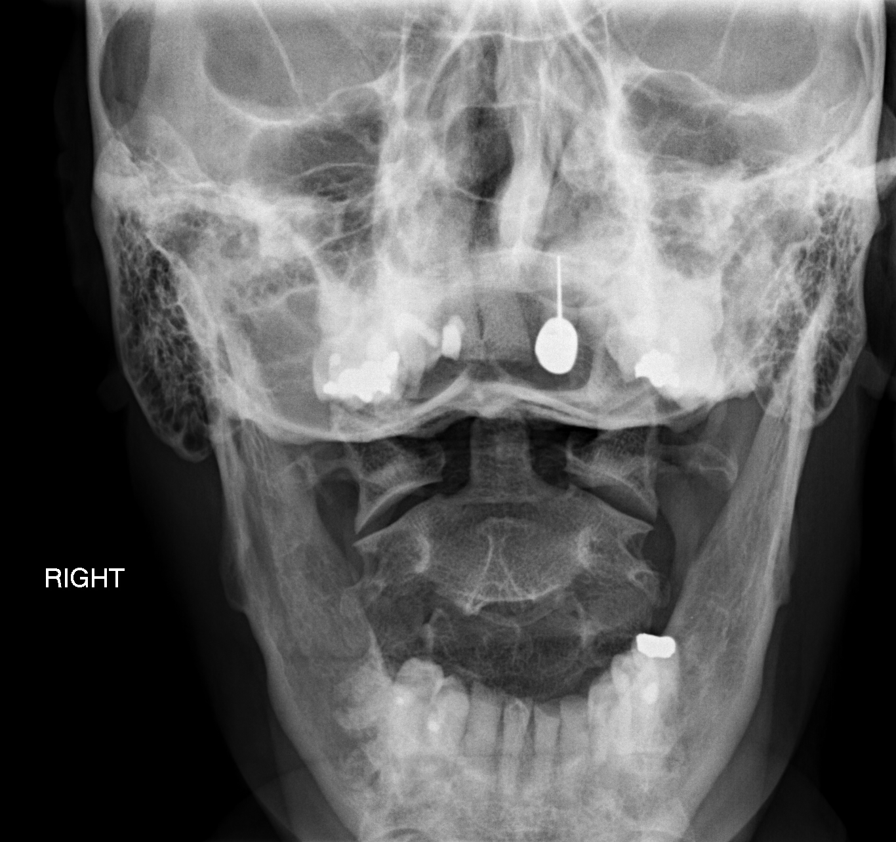

[w cervical spine odontoid (2 of 2)]
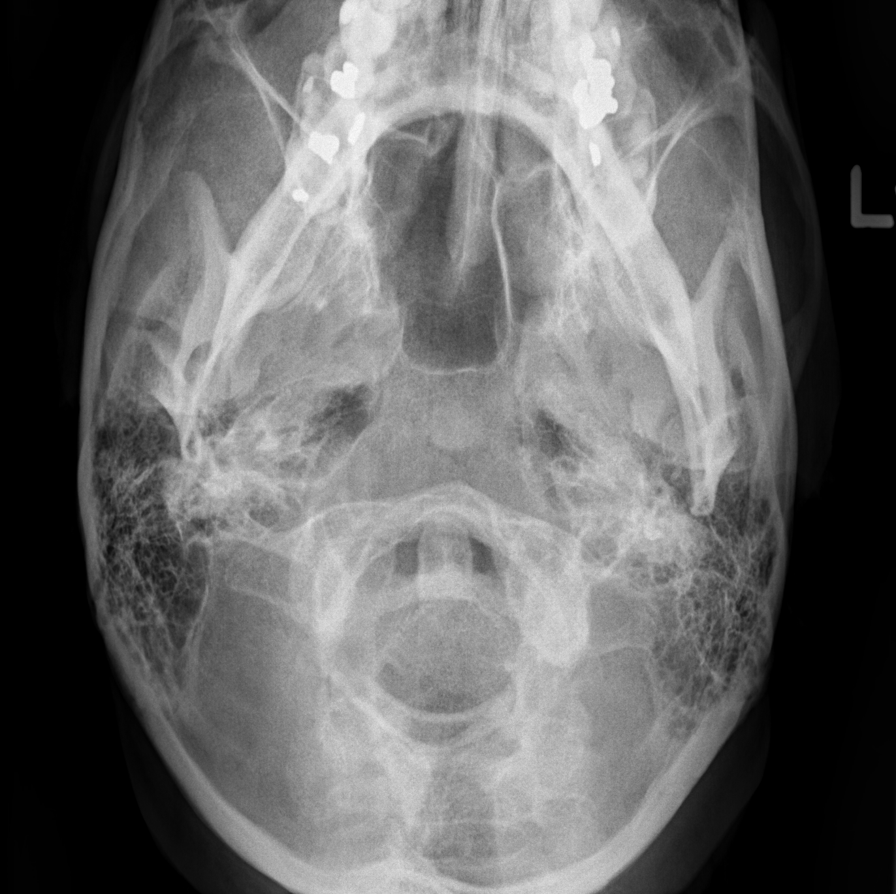

[w cervical swimmers]
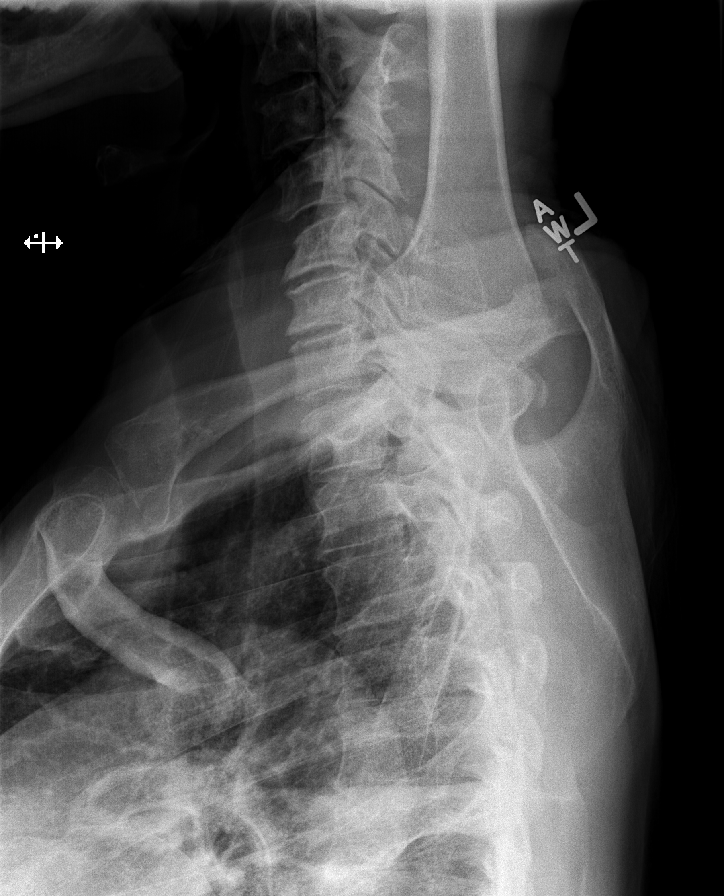

[7 of 7 positions shown; findings below may reference images not displayed]

FINDINGS: Seven views of the cervical spine demonstrate no acute displaced
fracture. 2 mm of anterolisthesis of C4 upon C5 is noted. Alignment
is otherwise anatomic. Prevertebral soft tissues are normal. Mild
compression of the anterior aspect of C5 is noted (approximately 10%
loss of anterior vertebral body height), likely chronic. Multilevel
degenerative disc disease, most severe at C5-C6 and C6-C7.
Multilevel facet arthropathy.
IMPRESSION: 1. No acute findings to account for the patient's symptoms.
2. However, there is extensive multilevel degenerative disc disease
and cervical spondylosis, as detailed above.

## 2014-01-09 MED ORDER — HYDROCODONE-ACETAMINOPHEN 5-325 MG PO TABS: 1.0000 | ORAL_TABLET | ORAL | Status: DC | PRN

## 2014-01-09 MED ORDER — METHOCARBAMOL 500 MG PO TABS: 500.0000 mg | ORAL_TABLET | Freq: Four times a day (QID) | ORAL | Status: DC | PRN

## 2014-01-09 MED ORDER — HYDROCODONE-ACETAMINOPHEN 5-325 MG PO TABS
1.0000 | ORAL_TABLET | Freq: Once | ORAL | Status: AC
  Administered 2014-01-09: 1 via ORAL
  Filled 2014-01-09: qty 1

## 2014-01-09 MED ORDER — ACETAMINOPHEN 325 MG PO TABS: 650.0000 mg | ORAL_TABLET | Freq: Once | ORAL | Status: DC

## 2014-01-09 MED ORDER — PREDNISONE (PAK) 10 MG PO TABS: ORAL_TABLET | Freq: Every day | ORAL | Status: DC

## 2014-01-09 NOTE — Discharge Instructions (Signed)
Read the information below.  Use the prescribed medication as directed.  Please discuss all new medications with your pharmacist.  Do not take additional tylenol while taking the prescribed pain medication to avoid overdose.  You may return to the Emergency Department at any time for worsening condition or any new symptoms that concern you.   If you develop fevers, loss of control of bowel or bladder, weakness or numbness in your arms or legs, or are unable to walk, return to the ER for a recheck.  °

## 2014-01-09 NOTE — ED Notes (Signed)
Pt transported to CT ?

## 2014-01-09 NOTE — ED Notes (Signed)
Pt states he has had R shoulder pain and numbness since yesterday. States he carries a back pack leaf blower. Alert and oriented.

## 2014-01-09 NOTE — ED Notes (Signed)
Pt ambulated to lobby with wife. They both report they are leaving with ALL belongings they arrived with.

## 2014-01-09 NOTE — ED Provider Notes (Signed)
CSN: [id number]     Arrival date & time 01/09/14  1639 History  This chart was scribed for non-physician practitioner, Trixie DredgeEmily Bary Limbach, PA-C working with Mirian MoMatthew Gentry, MD by Greggory StallionKayla Andersen, ED scribe. This patient was seen in room WTR8/WTR8 and the patient's care was started at 5:06 PM.   Chief Complaint  Patient presents with  . Shoulder Pain   The history is provided by the patient. No language interpreter was used.    HPI Comments: Kevin Maxwell is a 63 y.o. male who presents to the Emergency Department complaining of throbbing right shoulder pain that radiates into his neck and upper back that started yesterday. Reports associated numbness in his shoulder. 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 He has used Radiation protection practitionerBengay and grain alcohol with no relief. Certain movements worsen the pain but holding his arm up relieves some of it. Denies history of neck problems. Denies fever, unexpected weight loss, trouble breathing, chest pain, lower back pain, leg pain, tingling or weakness in arms.   Past Medical History  Diagnosis Date  . Shoulder problem     left shoulder   Past Surgical History  Procedure Laterality Date  . Hernia repair     History reviewed. No pertinent family history. History  Substance Use Topics  . Smoking status: Current Every Day Smoker -- 0.50 packs/day    Types: Cigarettes  . Smokeless tobacco: Never Used  . Alcohol Use: Yes     Comment: quart to 1.5 after work each day of beer    Review of Systems  Constitutional: Negative for fever and unexpected weight change.  Cardiovascular: Negative for chest pain.  Musculoskeletal: Positive for myalgias and arthralgias. Negative for back pain.  Neurological: Positive for numbness. Negative for weakness.  All other systems reviewed and are negative.  Allergies  Aspirin and Ibuprofen  Home Medications   Prior to Admission medications   Not on File   BP 159/94 mmHg  Pulse  92  Temp(Src) 98 F (36.7 C) (Oral)  Resp 18  SpO2 100%   Physical Exam  Constitutional: He appears well-developed and well-nourished. No distress.  HENT:  Head: Normocephalic and atraumatic.  Neck: Neck supple.  Pulmonary/Chest: Effort normal.  Musculoskeletal:  Spine nontender, no crepitus, or stepoffs. Right paraspinal and trapezius tenderness. Pt reporting decreased sensation with palpation. Upper extremities:  Strength 5/5, sensation intact, distal pulses intact.    Neurological: He is alert.  Skin: He is not diaphoretic.  Nursing note and vitals reviewed.   ED Course  Procedures (including critical care time)  DIAGNOSTIC STUDIES: Oxygen Saturation is 100% on RA, normal by my interpretation.    COORDINATION OF CARE: 5:10 PM-Discussed treatment plan which includes neck xray with pt at bedside and pt agreed to plan.  Labs Review Labs Reviewed - No data to display  Imaging Review Dg Cervical Spine Complete  01/09/2014   CLINICAL DATA:  63 year old male with 2 day history of right-sided neck pain.  EXAM: CERVICAL SPINE  4+ VIEWS  COMPARISON:  No priors.  FINDINGS: Seven views of the cervical spine demonstrate no acute displaced fracture. 2 mm of anterolisthesis of C4 upon C5 is noted. Alignment is otherwise anatomic. Prevertebral soft tissues are normal. Mild compression of the anterior aspect of C5 is noted (approximately 10% loss of anterior vertebral body height), likely chronic. Multilevel degenerative disc disease, most severe at C5-C6 and C6-C7. Multilevel facet arthropathy.  IMPRESSION: 1. No acute findings  to account for the patient's symptoms. 2. However, there is extensive multilevel degenerative disc disease and cervical spondylosis, as detailed above.   Electronically Signed   By: Trudie Reedaniel  Entrikin M.D.   On: 01/09/2014 17:50     EKG Interpretation None      MDM   Final diagnoses:  Upper back pain on right side  Degenerative disc disease, cervical     Afebrile, nontoxic patient with right upper back pain and numbness.  No injury.  No weakness of the extremity.  Xray shows extensive degenerative change.  This may be causing radicular symptoms into right upper back.  Will be able to do no heavy lifting and continue working.   D/C home with pain medication, prednisone, neurosurgery follow up.   Discussed result, findings, treatment, and follow up  with patient.  Pt given return precautions.  Pt verbalizes understanding and agrees with plan.       I personally performed the services described in this documentation, which was scribed in my presence. The recorded information has been reviewed and is accurate.  Trixie Dredgemily Doreather Hoxworth, PA-C 01/09/14 1921  Mirian MoMatthew Gentry, MD 01/11/14 [id number]

## 2014-01-12 ENCOUNTER — Ambulatory Visit: Payer: Self-pay | Attending: Internal Medicine | Admitting: Internal Medicine

## 2014-01-12 ENCOUNTER — Encounter: Payer: Self-pay | Admitting: Internal Medicine

## 2014-01-12 VITALS — BP 150/90 | HR 78 | Temp 97.8°F | Resp 16 | Wt 174.0 lb

## 2014-01-12 DIAGNOSIS — F17200 Nicotine dependence, unspecified, uncomplicated: Secondary | ICD-10-CM

## 2014-01-12 DIAGNOSIS — Z79891 Long term (current) use of opiate analgesic: Secondary | ICD-10-CM | POA: Insufficient documentation

## 2014-01-12 DIAGNOSIS — N4889 Other specified disorders of penis: Secondary | ICD-10-CM

## 2014-01-12 DIAGNOSIS — Z720 Tobacco use: Secondary | ICD-10-CM

## 2014-01-12 DIAGNOSIS — R52 Pain, unspecified: Secondary | ICD-10-CM | POA: Insufficient documentation

## 2014-01-12 DIAGNOSIS — Z23 Encounter for immunization: Secondary | ICD-10-CM | POA: Insufficient documentation

## 2014-01-12 DIAGNOSIS — F17210 Nicotine dependence, cigarettes, uncomplicated: Secondary | ICD-10-CM | POA: Insufficient documentation

## 2014-01-12 DIAGNOSIS — N503 Cyst of epididymis: Secondary | ICD-10-CM | POA: Insufficient documentation

## 2014-01-12 DIAGNOSIS — I10 Essential (primary) hypertension: Secondary | ICD-10-CM | POA: Insufficient documentation

## 2014-01-12 LAB — POCT URINALYSIS DIPSTICK
Bilirubin, UA: NEGATIVE
GLUCOSE UA: NEGATIVE
KETONES UA: NEGATIVE
Leukocytes, UA: NEGATIVE
Nitrite, UA: NEGATIVE
Protein, UA: NEGATIVE
RBC UA: NEGATIVE
SPEC GRAV UA: 1.01
UROBILINOGEN UA: 0.2
pH, UA: 7

## 2014-01-12 MED ORDER — AMLODIPINE BESYLATE 2.5 MG PO TABS: 2.5000 mg | ORAL_TABLET | Freq: Every day | ORAL | Status: DC

## 2014-01-12 NOTE — Patient Instructions (Signed)
Smoking Cessation Quitting smoking is important to your health and has many advantages. However, it is not always easy to quit since nicotine is a very addictive drug. Oftentimes, people try 3 times or more before being able to quit. This document explains the best ways for you to prepare to quit smoking. Quitting takes hard work and a lot of effort, but you can do it. ADVANTAGES OF QUITTING SMOKING  You will live longer, feel better, and live better.  Your body will feel the impact of quitting smoking almost immediately.  Within 20 minutes, blood pressure decreases. Your pulse returns to its normal level.  After 8 hours, carbon monoxide levels in the blood return to normal. Your oxygen level increases.  After 24 hours, the chance of having a heart attack starts to decrease. Your breath, hair, and body stop smelling like smoke.  After 48 hours, damaged nerve endings begin to recover. Your sense of taste and smell improve.  After 72 hours, the body is virtually free of nicotine. Your bronchial tubes relax and breathing becomes easier.  After 2 to 12 weeks, lungs can hold more air. Exercise becomes easier and circulation improves.  The risk of having a heart attack, stroke, cancer, or lung disease is greatly reduced.  After 1 year, the risk of coronary heart disease is cut in half.  After 5 years, the risk of stroke falls to the same as a nonsmoker.  After 10 years, the risk of lung cancer is cut in half and the risk of other cancers decreases significantly.  After 15 years, the risk of coronary heart disease drops, usually to the level of a nonsmoker.  If you are pregnant, quitting smoking will improve your chances of having a healthy baby.  The people you live with, especially any children, will be healthier.  You will have extra money to spend on things other than cigarettes. QUESTIONS TO THINK ABOUT BEFORE ATTEMPTING TO QUIT You may want to talk about your answers with your  health care provider.  Why do you want to quit?  If you tried to quit in the past, what helped and what did not?  What will be the most difficult situations for you after you quit? How will you plan to handle them?  Who can help you through the tough times? Your family? Friends? A health care provider?  What pleasures do you get from smoking? What ways can you still get pleasure if you quit? Here are some questions to ask your health care provider:  How can you help me to be successful at quitting?  What medicine do you think would be best for me and how should I take it?  What should I do if I need more help?  What is smoking withdrawal like? How can I get information on withdrawal? GET READY  Set a quit date.  Change your environment by getting rid of all cigarettes, ashtrays, matches, and lighters in your home, car, or work. Do not let people smoke in your home.  Review your past attempts to quit. Think about what worked and what did not. GET SUPPORT AND ENCOURAGEMENT You have a better chance of being successful if you have help. You can get support in many ways.  Tell your family, friends, and coworkers that you are going to quit and need their support. Ask them not to smoke around you.  Get individual, group, or telephone counseling and support. Programs are available at local hospitals and health centers. Call   your local health department for information about programs in your area.  Spiritual beliefs and practices may help some smokers quit.  Download a "quit meter" on your computer to keep track of quit statistics, such as how long you have gone without smoking, cigarettes not smoked, and money saved.  Get a self-help book about quitting smoking and staying off tobacco. LEARN NEW SKILLS AND BEHAVIORS  Distract yourself from urges to smoke. Talk to someone, go for a walk, or occupy your time with a task.  Change your normal routine. Take a different route to work.  Drink tea instead of coffee. Eat breakfast in a different place.  Reduce your stress. Take a hot bath, exercise, or read a book.  Plan something enjoyable to do every day. Reward yourself for not smoking.  Explore interactive web-based programs that specialize in helping you quit. GET MEDICINE AND USE IT CORRECTLY Medicines can help you stop smoking and decrease the urge to smoke. Combining medicine with the above behavioral methods and support can greatly increase your chances of successfully quitting smoking.  Nicotine replacement therapy helps deliver nicotine to your body without the negative effects and risks of smoking. Nicotine replacement therapy includes nicotine gum, lozenges, inhalers, nasal sprays, and skin patches. Some may be available over-the-counter and others require a prescription.  Antidepressant medicine helps people abstain from smoking, but how this works is unknown. This medicine is available by prescription.  Nicotinic receptor partial agonist medicine simulates the effect of nicotine in your brain. This medicine is available by prescription. Ask your health care provider for advice about which medicines to use and how to use them based on your health history. Your health care provider will tell you what side effects to look out for if you choose to be on a medicine or therapy. Carefully read the information on the package. Do not use any other product containing nicotine while using a nicotine replacement product.  RELAPSE OR DIFFICULT SITUATIONS Most relapses occur within the first 3 months after quitting. Do not be discouraged if you start smoking again. Remember, most people try several times before finally quitting. You may have symptoms of withdrawal because your body is used to nicotine. You may crave cigarettes, be irritable, feel very hungry, cough often, get headaches, or have difficulty concentrating. The withdrawal symptoms are only temporary. They are strongest  when you first quit, but they will go away within 10-14 days. To reduce the chances of relapse, try to:  Avoid drinking alcohol. Drinking lowers your chances of successfully quitting.  Reduce the amount of caffeine you consume. Once you quit smoking, the amount of caffeine in your body increases and can give you symptoms, such as a rapid heartbeat, sweating, and anxiety.  Avoid smokers because they can make you want to smoke.  Do not let weight gain distract you. Many smokers will gain weight when they quit, usually less than 10 pounds. Eat a healthy diet and stay active. You can always lose the weight gained after you quit.  Find ways to improve your mood other than smoking. FOR MORE INFORMATION  www.smokefree.gov  Document Released: 02/04/2001 Document Revised: 06/27/2013 Document Reviewed: 05/22/2011 ExitCare Patient Information 2015 [employer]. This information is not intended to replace advice given to you by your health care provider. Make sure you discuss any questions you have with your health care provider. DASH Eating Plan DASH stands for "Dietary Approaches to Stop Hypertension." The DASH eating plan is a healthy eating plan that has   been shown to reduce high blood pressure (hypertension). Additional health benefits may include reducing the risk of type 2 diabetes mellitus, heart disease, and stroke. The DASH eating plan may also help with weight loss. WHAT DO I NEED TO KNOW ABOUT THE DASH EATING PLAN? For the DASH eating plan, you will follow these general guidelines:  Choose foods with a percent daily value for sodium of less than 5% (as listed on the food label).  Use salt-free seasonings or herbs instead of table salt or sea salt.  Check with your health care provider or pharmacist before using salt substitutes.  Eat lower-sodium products, often labeled as "lower sodium" or "no salt added."  Eat fresh foods.  Eat more vegetables, fruits, and low-fat dairy  products.  Choose whole grains. Look for the word "whole" as the first word in the ingredient list.  Choose fish and skinless chicken or turkey more often than red meat. Limit fish, poultry, and meat to 6 oz (170 g) each day.  Limit sweets, desserts, sugars, and sugary drinks.  Choose heart-healthy fats.  Limit cheese to 1 oz (28 g) per day.  Eat more home-cooked food and less restaurant, buffet, and fast food.  Limit fried foods.  Cook foods using methods other than frying.  Limit canned vegetables. If you do use them, rinse them well to decrease the sodium.  When eating at a restaurant, ask that your food be prepared with less salt, or no salt if possible. WHAT FOODS CAN I EAT? Seek help from a dietitian for individual calorie needs. Grains Whole grain or whole wheat bread. Brown rice. Whole grain or whole wheat pasta. Quinoa, bulgur, and whole grain cereals. Low-sodium cereals. Corn or whole wheat flour tortillas. Whole grain cornbread. Whole grain crackers. Low-sodium crackers. Vegetables Fresh or frozen vegetables (raw, steamed, roasted, or grilled). Low-sodium or reduced-sodium tomato and vegetable juices. Low-sodium or reduced-sodium tomato sauce and paste. Low-sodium or reduced-sodium canned vegetables.  Fruits All fresh, canned (in natural juice), or frozen fruits. Meat and Other Protein Products Ground beef (85% or leaner), grass-fed beef, or beef trimmed of fat. Skinless chicken or turkey. Ground chicken or turkey. Pork trimmed of fat. All fish and seafood. Eggs. Dried beans, peas, or lentils. Unsalted nuts and seeds. Unsalted canned beans. Dairy Low-fat dairy products, such as skim or 1% milk, 2% or reduced-fat cheeses, low-fat ricotta or cottage cheese, or plain low-fat yogurt. Low-sodium or reduced-sodium cheeses. Fats and Oils Tub margarines without trans fats. Light or reduced-fat mayonnaise and salad dressings (reduced sodium). Avocado. Safflower, olive, or canola  oils. Natural peanut or almond butter. Other Unsalted popcorn and pretzels. The items listed above may not be a complete list of recommended foods or beverages. Contact your dietitian for more options. WHAT FOODS ARE NOT RECOMMENDED? Grains White bread. White pasta. White rice. Refined cornbread. Bagels and croissants. Crackers that contain trans fat. Vegetables Creamed or fried vegetables. Vegetables in a cheese sauce. Regular canned vegetables. Regular canned tomato sauce and paste. Regular tomato and vegetable juices. Fruits Dried fruits. Canned fruit in light or heavy syrup. Fruit juice. Meat and Other Protein Products Fatty cuts of meat. Ribs, chicken wings, bacon, sausage, bologna, salami, chitterlings, fatback, hot dogs, bratwurst, and packaged luncheon meats. Salted nuts and seeds. Canned beans with salt. Dairy Whole or 2% milk, cream, half-and-half, and cream cheese. Whole-fat or sweetened yogurt. Full-fat cheeses or blue cheese. Nondairy creamers and whipped toppings. Processed cheese, cheese spreads, or cheese curds. Condiments Onion and garlic salt,   seasoned salt, table salt, and sea salt. Canned and packaged gravies. Worcestershire sauce. Tartar sauce. Barbecue sauce. Teriyaki sauce. Soy sauce, including reduced sodium. Steak sauce. Fish sauce. Oyster sauce. Cocktail sauce. Horseradish. Ketchup and mustard. Meat flavorings and tenderizers. Bouillon cubes. Hot sauce. Tabasco sauce. Marinades. Taco seasonings. Relishes. Fats and Oils Butter, stick margarine, lard, shortening, ghee, and bacon fat. Coconut, palm kernel, or palm oils. Regular salad dressings. Other Pickles and olives. Salted popcorn and pretzels. The items listed above may not be a complete list of foods and beverages to avoid. Contact your dietitian for more information. WHERE CAN I FIND MORE INFORMATION? National Heart, Lung, and Blood Institute: www.nhlbi.nih.gov/health/health-topics/topics/dash/ Document Released:  01/30/2011 Document Revised: 06/27/2013 Document Reviewed: 12/15/2012 ExitCare Patient Information 2015 [employer]. This information is not intended to replace advice given to you by your health care provider. Make sure you discuss any questions you have with your health care provider.  

## 2014-01-12 NOTE — Progress Notes (Signed)
Patient requesting a referral to urologist Patient states he does a lot of lifting and straining and  Has been having some blood discharge from his penis Complains of some burning when he voids as well He did state that when he does not do such strenuous work it subsides

## 2014-01-12 NOTE — Progress Notes (Signed)
MRN: [id number] Name: Kevin Maxwell  Sex: male Age: 63 y.o. DOB: 10-13-1950  Allergies: Aspirin and Ibuprofen  Chief Complaint  Patient presents with  . Follow-up    HPI: Patient is 63 y.o. male who comes today reported to have occasional dysuria , as per patient in the past he noticed some blood her in his urine but denies any currently, he has history of epididymal cyst, was referred to urology the past but could not make an appointment, he is requesting another referral, denies any fever chills nausea vomiting any rash or lumps in the groin area, his blood pressure trend noticed to be elevated, patient also smokes cigarettes, I have advised patient to quit smoking.  Past Medical History  Diagnosis Date  . Shoulder problem     left shoulder    Past Surgical History  Procedure Laterality Date  . Hernia repair        Medication List       This list is accurate as of: 01/12/14 11:27 AM.  Always use your most recent med list.               amLODipine 2.5 MG tablet  Commonly known as:  NORVASC  Take 1 tablet (2.5 mg total) by mouth daily.     HYDROcodone-acetaminophen 5-325 MG per tablet  Commonly known as:  NORCO/VICODIN  Take 1-2 tablets by mouth every 4 (four) hours as needed for moderate pain or severe pain.     methocarbamol 500 MG tablet  Commonly known as:  ROBAXIN  Take 1 tablet (500 mg total) by mouth every 6 (six) hours as needed for muscle spasms (or pain).     predniSONE 10 MG tablet  Commonly known as:  STERAPRED UNI-PAK  Take by mouth daily. Day 1: take 6 tabs.  Day 2: 5 tabs  Day 3: 4 tabs  Day 4: 3 tabs  Day 5: 2 tabs  Day 6: 1 tab        Meds ordered this encounter  Medications  . amLODipine (NORVASC) 2.5 MG tablet    Sig: Take 1 tablet (2.5 mg total) by mouth daily.    Dispense:  90 tablet    Refill:  3     There is no immunization history on file for this patient.  History reviewed. No pertinent family history.  History  Substance  Use Topics  . Smoking status: Current Every Day Smoker -- 0.50 packs/day    Types: Cigarettes  . Smokeless tobacco: Never Used  . Alcohol Use: Yes     Comment: quart to 1.5 after work each day of beer    Review of Systems   As noted in HPI  Filed Vitals:   01/12/14 1118  BP: 150/90  Pulse:   Temp:   Resp:     Physical Exam  Physical Exam  Constitutional: No distress.  Eyes: EOM are normal. Pupils are equal, round, and reactive to light.  Cardiovascular: Normal rate and regular rhythm.   Pulmonary/Chest: Breath sounds normal. No respiratory distress. He has no wheezes. He has no rales.  Musculoskeletal:  Palpable epididymis non tender, no penile discharge or rash      CBC    Component Value Date/Time   WBC 5.3 09/05/2013 0920   RBC 5.16 09/05/2013 0920   HGB 17.0 12/12/2013 1954   HCT 50.0 12/12/2013 1954   PLT 251 09/05/2013 0920   MCV 87.6 09/05/2013 0920   LYMPHSABS 2.5 09/05/2013 0920   MONOABS  0.5 09/05/2013 0920   EOSABS 0.1 09/05/2013 0920   BASOSABS 0.0 09/05/2013 0920    CMP     Component Value Date/Time   NA 135* 12/12/2013 1954   K 3.9 12/12/2013 1954   CL 101 12/12/2013 1954   CO2 24 09/05/2013 0921   GLUCOSE 98 12/12/2013 1954   BUN 16 12/12/2013 1954   CREATININE 0.80 12/12/2013 1954   CREATININE 0.84 09/05/2013 0921   CALCIUM 9.1 09/05/2013 0921   PROT 7.5 09/05/2013 0921   ALBUMIN 4.7 09/05/2013 0921   AST 16 09/05/2013 0921   ALT 17 09/05/2013 0921   ALKPHOS 41 09/05/2013 0921   BILITOT 0.8 09/05/2013 0921   GFRNONAA >89 09/05/2013 0921   GFRNONAA 89* 05/09/2012 1347   GFRAA >89 09/05/2013 0921   GFRAA >90 05/09/2012 1347    Lab Results  Component Value Date/Time   CHOL 238* 09/05/2013 09:21 AM    No components found for: HGA1C  Lab Results  Component Value Date/Time   AST 16 09/05/2013 09:21 AM    Assessment and Plan  Essential hypertension - Plan:advised patient for DASH diet, also started on amLODipine (NORVASC)  2.5 MG tablet, we'll do blood chemistry on the next visit.  Epididymal cyst - Plan: Ambulatory referral to Urology  Penile pain - Plan:  Results for orders placed or performed in visit on 01/12/14  Urinalysis Dipstick  Result Value Ref Range   Color, UA yellow    Clarity, UA clear    Glucose, UA neg    Bilirubin, UA neg    Ketones, UA neg    Spec Grav, UA 1.010    Blood, UA neg    pH, UA 7.0    Protein, UA neg    Urobilinogen, UA 0.2    Nitrite, UA neg    Leukocytes, UA Negative     Urinalysis Dipstick is negative for hematuria or  infection  Need for prophylactic vaccination against Streptococcus pneumoniae (pneumococcus) Pneumovax given today  Smoking Advised patient to quit smoking  Health Maintenance : -Vaccinations: patient declines flu shot  Pneumovax given today   Return in about 3 months (around 04/14/2014) for hypertension.  Doris CheadleADVANI, Lukus Binion, MD

## 2014-01-23 ENCOUNTER — Ambulatory Visit: Payer: Self-pay

## 2014-02-20 ENCOUNTER — Encounter (HOSPITAL_COMMUNITY): Payer: Self-pay

## 2014-02-20 ENCOUNTER — Emergency Department (HOSPITAL_COMMUNITY)
Admission: EM | Admit: 2014-02-20 | Discharge: 2014-02-20 | Disposition: A | Discharge status: Home / Self Care | Payer: Self-pay | Attending: Emergency Medicine | Admitting: Emergency Medicine

## 2014-02-20 DIAGNOSIS — X58XXXD Exposure to other specified factors, subsequent encounter: Secondary | ICD-10-CM | POA: Insufficient documentation

## 2014-02-20 DIAGNOSIS — Z7952 Long term (current) use of systemic steroids: Secondary | ICD-10-CM | POA: Insufficient documentation

## 2014-02-20 DIAGNOSIS — S46811D Strain of other muscles, fascia and tendons at shoulder and upper arm level, right arm, subsequent encounter: Secondary | ICD-10-CM

## 2014-02-20 DIAGNOSIS — Z79899 Other long term (current) drug therapy: Secondary | ICD-10-CM | POA: Insufficient documentation

## 2014-02-20 DIAGNOSIS — S46911D Strain of unspecified muscle, fascia and tendon at shoulder and upper arm level, right arm, subsequent encounter: Secondary | ICD-10-CM | POA: Insufficient documentation

## 2014-02-20 DIAGNOSIS — Z720 Tobacco use: Secondary | ICD-10-CM | POA: Insufficient documentation

## 2014-02-20 MED ORDER — METHOCARBAMOL 500 MG PO TABS: 1000.0000 mg | ORAL_TABLET | Freq: Four times a day (QID) | ORAL | Status: DC | PRN

## 2014-02-20 MED ORDER — METHOCARBAMOL 500 MG PO TABS
1000.0000 mg | ORAL_TABLET | Freq: Once | ORAL | Status: AC
  Administered 2014-02-20: 1000 mg via ORAL
  Filled 2014-02-20: qty 2

## 2014-02-20 MED ORDER — OXYCODONE-ACETAMINOPHEN 5-325 MG PO TABS: ORAL_TABLET | ORAL | Status: DC

## 2014-02-20 MED ORDER — OXYCODONE-ACETAMINOPHEN 5-325 MG PO TABS
1.0000 | ORAL_TABLET | Freq: Once | ORAL | Status: AC
  Administered 2014-02-20: 1 via ORAL
  Filled 2014-02-20: qty 1

## 2014-02-20 NOTE — ED Notes (Signed)
Pt lifting large item onto back on Saturday.  Felt sharp pain in upper mid back.  Pain also in rt shoulder.

## 2014-02-20 NOTE — ED Notes (Signed)
Pt states has taken these medications before and is ready to be discharged

## 2014-02-20 NOTE — Discharge Instructions (Signed)
Please take ibuprofen 400mg  (this is normally 2 over the counter pills) every 6 hours (take with food to minimze stomach irritation).   Take robaxin and/or percocet for breakthrough pain, do not drink alcohol, drive, care for children or perfom other critical tasks while taking robaxin and/or percocet.  Please follow with your primary care doctor in the next 2 days for a check-up. They must obtain records for further management.   Do not hesitate to return to the Emergency Department for any new, worsening or concerning symptoms.    Muscle Strain A muscle strain (pulled muscle) happens when a muscle is stretched beyond normal length. It happens when a sudden, violent force stretches your muscle too far. Usually, a few of the fibers in your muscle are torn. Muscle strain is common in athletes. Recovery usually takes 1-2 weeks. Complete healing takes 5-6 weeks.  HOME CARE   Follow the PRICE method of treatment to help your injury get better. Do this the first 2-3 days after the injury:  Protect. Protect the muscle to keep it from getting injured again.  Rest. Limit your activity and rest the injured body part.  Ice. Put ice in a plastic bag. Place a towel between your skin and the bag. Then, apply the ice and leave it on from 15-20 minutes each hour. After the third day, switch to moist heat packs.  Compression. Use a splint or elastic bandage on the injured area for comfort. Do not put it on too tightly.  Elevate. Keep the injured body part above the level of your heart.  Only take medicine as told by your doctor.  Warm up before doing exercise to prevent future muscle strains. GET HELP IF:   You have more pain or puffiness (swelling) in the injured area.  You feel numbness, tingling, or notice a loss of strength in the injured area. MAKE SURE YOU:   Understand these instructions.  Will watch your condition.  Will get help right away if you are not doing well or get  worse. Document Released: 11/20/2007 Document Revised: 12/01/2012 Document Reviewed: 09/09/2012 [facility] Patient Information 2015 [place], [place]. This information is not intended to replace advice given to you by your health care provider. Make sure you discuss any questions you have with your health care provider.

## 2014-02-20 NOTE — ED Provider Notes (Signed)
CSN: [id number]     Arrival date & time 02/20/14  1447 History  This chart was scribed for non-physician practitioner, Wynetta EmeryNicole Thao Vanover, PA-C, working with Elwin MochaBlair Walden, MD, by Ronney LionSuzanne Le, ED Scribe. This patient was seen in room WTR9/WTR9 and the patient's care was started at 5:41 PM.    Chief Complaint  Patient presents with  . Back Pain    The history is provided by the patient. No language interpreter was used.     HPI Comments:  Kevin Maxwell is a 63 y.o. male who presents to the Emergency Department complaining of acute, mid upper back pain that occurred after patient lifted a heavy backpack 2 days ago. He initially injured it due to the same reason a month ago, and was prescribed a muscle relaxer and pain medication, which he has taken with some relief. He states that he has run out of pain medication. He complains of associated numbness near his right shoulder. He complains that he is unable to turn his head to the left or raise his right shoulder. He denies trauma or impact to the area. Dr. Orpah CobbAdvani is his PCP, and knows about his first injury.   Prior to Admission medications   Medication Sig Start Date End Date Taking? Authorizing Provider  amLODipine (NORVASC) 2.5 MG tablet Take 1 tablet (2.5 mg total) by mouth daily. 01/12/14   Doris Cheadleeepak Advani, MD  HYDROcodone-acetaminophen (NORCO/VICODIN) 5-325 MG per tablet Take 1-2 tablets by mouth every 4 (four) hours as needed for moderate pain or severe pain. 01/09/14   Trixie DredgeEmily West, PA-C  methocarbamol (ROBAXIN) 500 MG tablet Take 1 tablet (500 mg total) by mouth every 6 (six) hours as needed for muscle spasms (or pain). 01/09/14   Trixie DredgeEmily West, PA-C  predniSONE (STERAPRED UNI-PAK) 10 MG tablet Take by mouth daily. Day 1: take 6 tabs.  Day 2: 5 tabs  Day 3: 4 tabs  Day 4: 3 tabs  Day 5: 2 tabs  Day 6: 1 tab 01/09/14   Trixie DredgeEmily West, PA-C     Past Medical History  Diagnosis Date  . Shoulder problem     left shoulder   Past Surgical History   Procedure Laterality Date  . Hernia repair     History reviewed. No pertinent family history. History  Substance Use Topics  . Smoking status: Current Every Day Smoker -- 0.50 packs/day    Types: Cigarettes  . Smokeless tobacco: Never Used  . Alcohol Use: Yes     Comment: quart to 1.5 after work each day of beer    Review of Systems  A complete 10 system review of systems was obtained and all systems are negative except as noted in the HPI and PMH.    Allergies  Aspirin and Ibuprofen  Home Medications   Prior to Admission medications   Medication Sig Start Date End Date Taking? Authorizing Provider  amLODipine (NORVASC) 2.5 MG tablet Take 1 tablet (2.5 mg total) by mouth daily. 01/12/14   Doris Cheadleeepak Advani, MD  HYDROcodone-acetaminophen (NORCO/VICODIN) 5-325 MG per tablet Take 1-2 tablets by mouth every 4 (four) hours as needed for moderate pain or severe pain. 01/09/14   Trixie DredgeEmily West, PA-C  methocarbamol (ROBAXIN) 500 MG tablet Take 1 tablet (500 mg total) by mouth every 6 (six) hours as needed for muscle spasms (or pain). 01/09/14   Trixie DredgeEmily West, PA-C  predniSONE (STERAPRED UNI-PAK) 10 MG tablet Take by mouth daily. Day 1: take 6 tabs.  Day 2: 5 tabs  Day 3:  4 tabs  Day 4: 3 tabs  Day 5: 2 tabs  Day 6: 1 tab 01/09/14   Trixie DredgeEmily West, PA-C   BP 151/101 mmHg  Pulse 78  Temp(Src) 98.3 F (36.8 C) (Oral)  Resp 16  Ht 5\' 9"  (1.753 m)  Wt 166 lb (75.297 kg)  BMI 24.50 kg/m2  SpO2 100% Physical Exam  Constitutional: He is oriented to person, place, and time. He appears well-developed and well-nourished. No distress.  HENT:  Head: Normocephalic and atraumatic.  Eyes: Conjunctivae and EOM are normal.  Neck: Normal range of motion. Neck supple. No tracheal deviation present.  No midline C-spine  tenderness to palpation or step-offs appreciated. Patient has full range of motion without pain.   Cardiovascular: Normal rate.   Pulmonary/Chest: Effort normal. No respiratory distress.   Musculoskeletal: Normal range of motion.       Arms: Neurological: He is alert and oriented to person, place, and time.  Skin: Skin is warm and dry.  Psychiatric: He has a normal mood and affect. His behavior is normal.  Nursing note and vitals reviewed.   ED Course  Procedures (including critical care time)  DIAGNOSTIC STUDIES: Oxygen Saturation is 100% on room air, normal by my interpretation.    COORDINATION OF CARE: 5:44 PM - Discussed treatment plan with pt at bedside which includes pain medication and pt agreed to plan.   Labs Review Labs Reviewed - No data to display  Imaging Review No results found.   EKG Interpretation None      MDM   Final diagnoses:  None   Filed Vitals:   02/20/14 1539 02/20/14 1828  BP: 151/101 160/101  Pulse: 78 74  Temp: 98.3 F (36.8 C) 98.3 F (36.8 C)  TempSrc: Oral Oral  Resp: 16 18  Height: 5\' 9"  (1.753 m)   Weight: 166 lb (75.297 kg)   SpO2: 100% 98%    Medications  methocarbamol (ROBAXIN) tablet 1,000 mg (1,000 mg Oral Given 02/20/14 1827)  oxyCODONE-acetaminophen (PERCOCET/ROXICET) 5-325 MG per tablet 1 tablet (1 tablet Oral Given 02/20/14 1827)    Kevin Maxwell is a pleasant 63 y.o. male presenting with right trapezius spasm after lifting a heavy backpack. Had similar issues about a month ago and pain was resolving and then he reinjured it with the same type of lifting several days ago. Patient is neurovascularly intact, he has trapezius spasm.  Evaluation does not show pathology that would require ongoing emergent intervention or inpatient treatment. Pt is hemodynamically stable and mentating appropriately. Discussed findings and plan with patient/guardian, who agrees with care plan. All questions answered. Return precautions discussed and outpatient follow up given.   Discharge Medication List as of 02/20/2014  5:49 PM    START taking these medications   Details  oxyCODONE-acetaminophen (PERCOCET/ROXICET) 5-325 MG  per tablet 1 to 2 tabs PO q6hrs  PRN for pain, Print          I personally performed the services described in this documentation, which was scribed in my presence. The recorded information has been reviewed and is accurate.      Wynetta Emeryicole Rody Keadle, PA-C 02/20/14 1840  Elwin MochaBlair Walden, MD 02/21/14 [phone]

## 2014-04-23 ENCOUNTER — Emergency Department (HOSPITAL_COMMUNITY): Payer: Self-pay

## 2014-04-23 ENCOUNTER — Encounter (HOSPITAL_COMMUNITY): Payer: Self-pay

## 2014-04-23 ENCOUNTER — Emergency Department (HOSPITAL_COMMUNITY)
Admission: EM | Admit: 2014-04-23 | Discharge: 2014-04-23 | Disposition: A | Discharge status: Home / Self Care | Payer: Self-pay | Attending: Emergency Medicine | Admitting: Emergency Medicine

## 2014-04-23 DIAGNOSIS — Y9289 Other specified places as the place of occurrence of the external cause: Secondary | ICD-10-CM | POA: Insufficient documentation

## 2014-04-23 DIAGNOSIS — Y30XXXA Falling, jumping or pushed from a high place, undetermined intent, initial encounter: Secondary | ICD-10-CM | POA: Insufficient documentation

## 2014-04-23 DIAGNOSIS — Z720 Tobacco use: Secondary | ICD-10-CM | POA: Insufficient documentation

## 2014-04-23 DIAGNOSIS — Y9339 Activity, other involving climbing, rappelling and jumping off: Secondary | ICD-10-CM | POA: Insufficient documentation

## 2014-04-23 DIAGNOSIS — Z79899 Other long term (current) drug therapy: Secondary | ICD-10-CM | POA: Insufficient documentation

## 2014-04-23 DIAGNOSIS — Y998 Other external cause status: Secondary | ICD-10-CM | POA: Insufficient documentation

## 2014-04-23 DIAGNOSIS — S9032XA Contusion of left foot, initial encounter: Secondary | ICD-10-CM | POA: Insufficient documentation

## 2014-04-23 DIAGNOSIS — S93402A Sprain of unspecified ligament of left ankle, initial encounter: Secondary | ICD-10-CM | POA: Insufficient documentation

## 2014-04-23 IMAGING — CR DG FOOT COMPLETE 3+V*L*
3 series · 3 of 3 positions shown · non-contrast
Comparison: None.

CLINICAL DATA: Left foot pain x1 month, worse since jumping out of
truck yesterday

EXAM:
LEFT FOOT - COMPLETE 3+ VIEW

[x foot ap left]
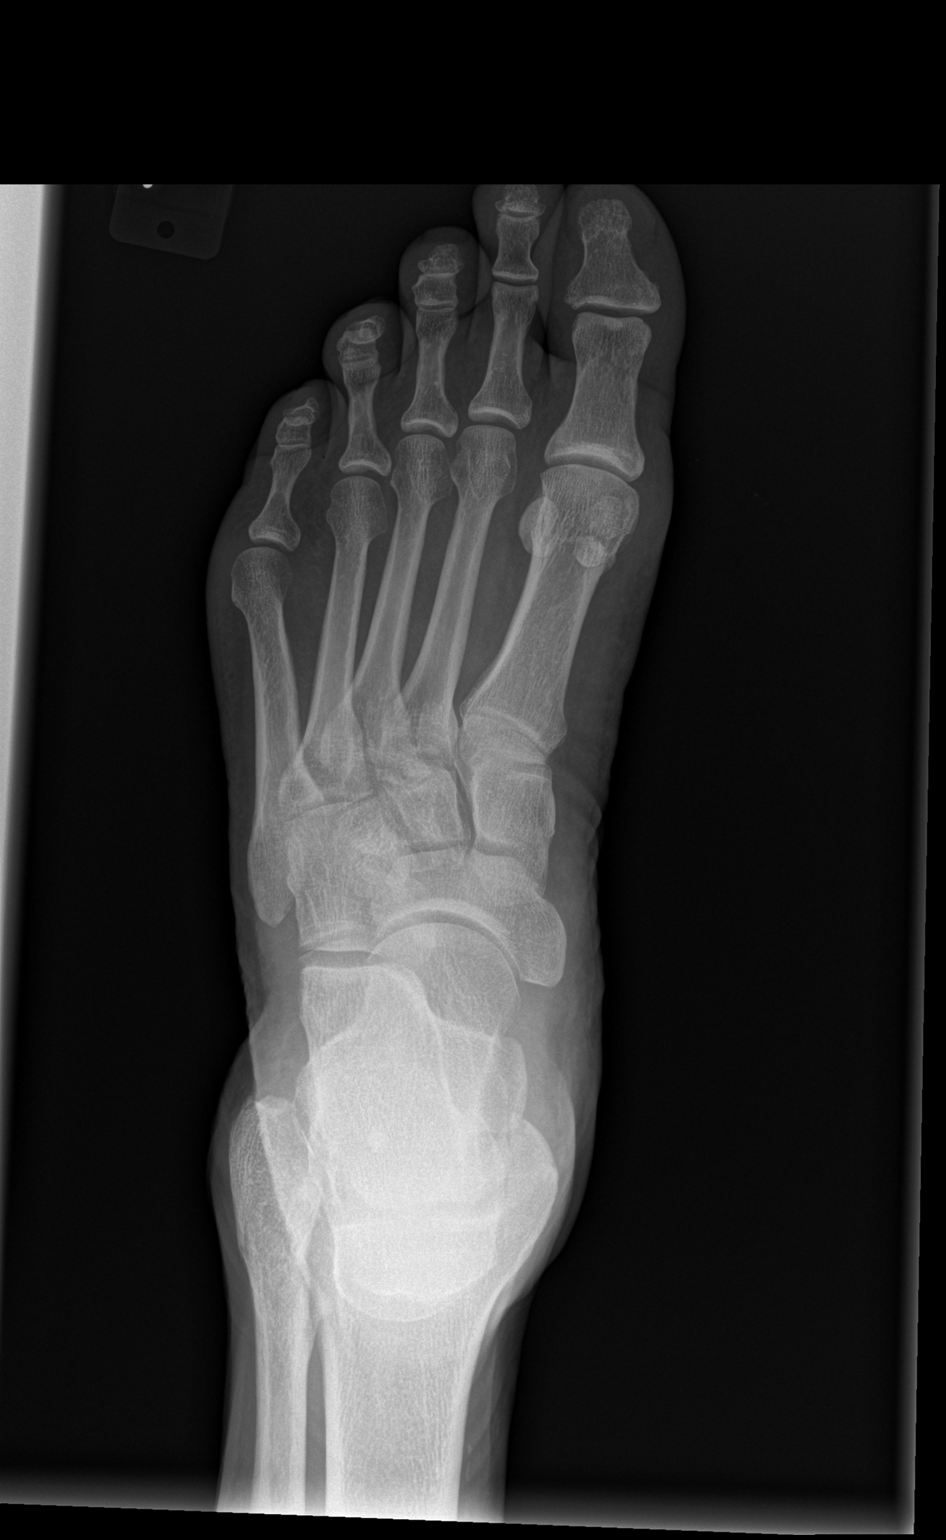

[x foot obl left]
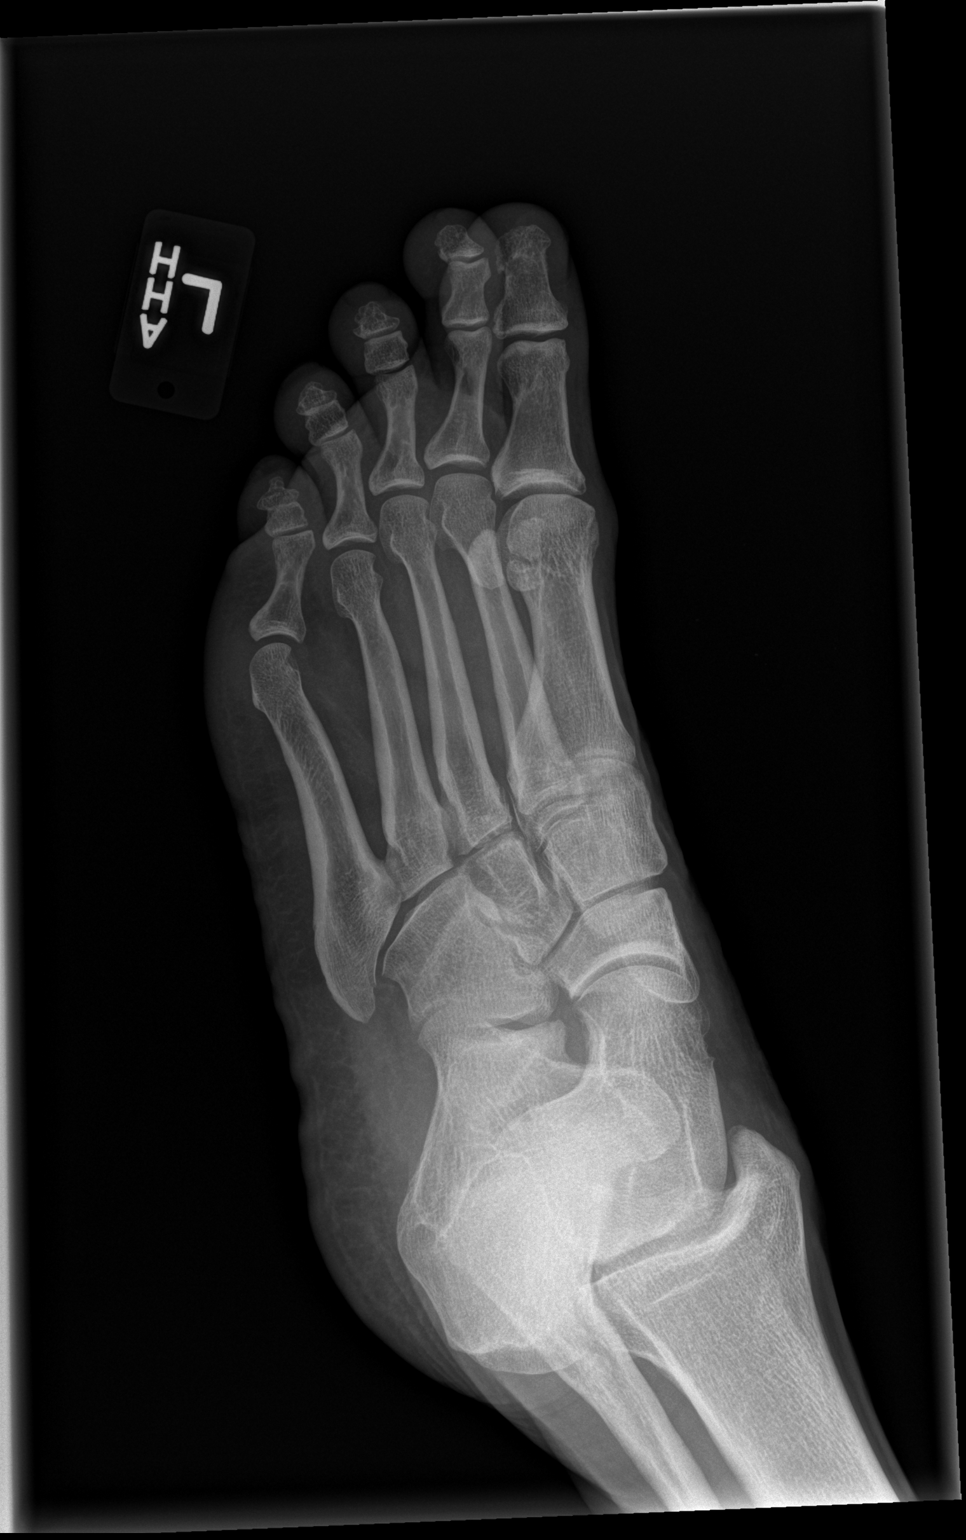

[x foot lat left]
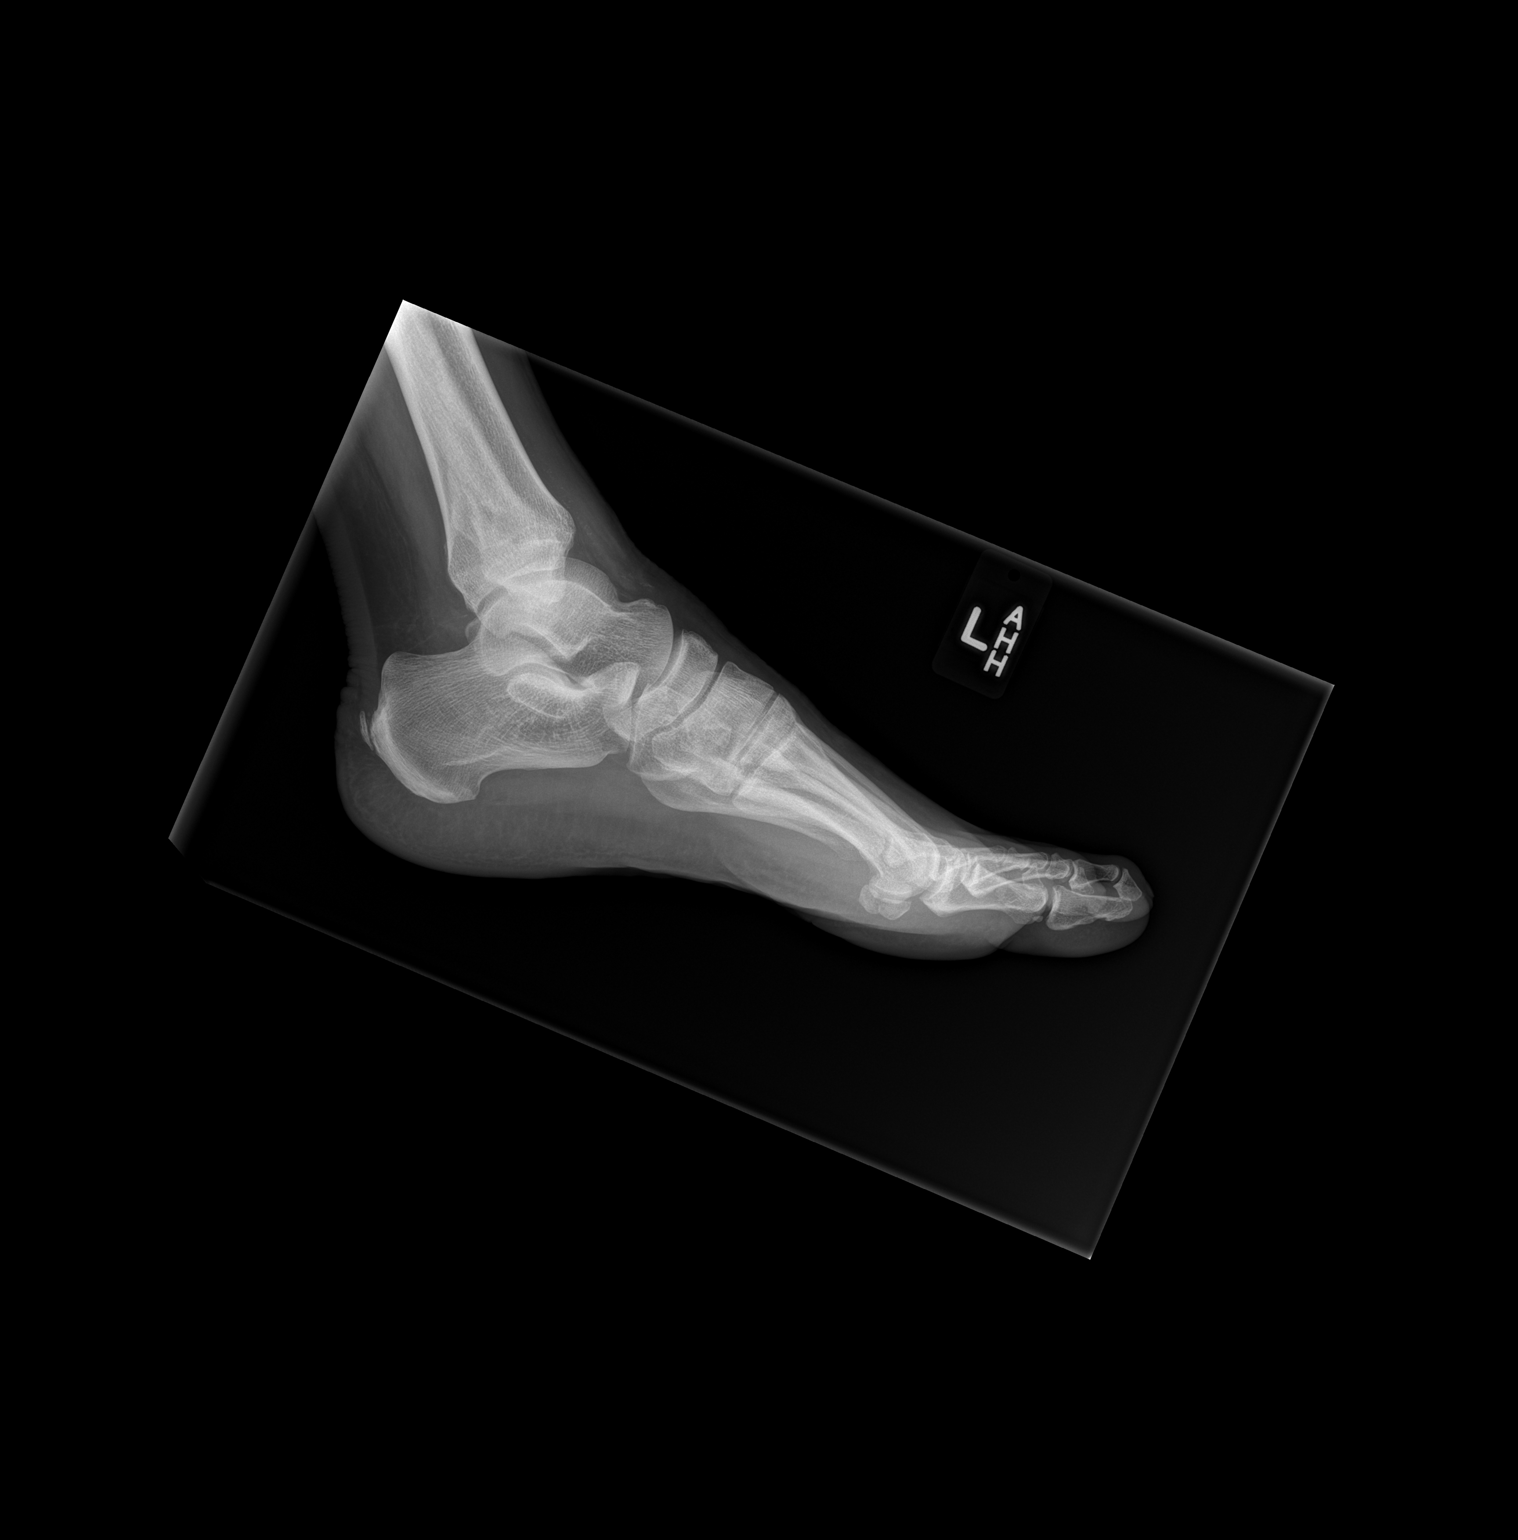

[3 of 3 positions shown; findings below may reference images not displayed]

FINDINGS: No fracture or dislocation is seen.

The joint spaces are preserved.

The visualized soft tissues are unremarkable.

Mild residual deformity related to healed distal fibular fracture.
IMPRESSION: No fracture or dislocation is seen.

## 2014-04-23 IMAGING — CR DG ANKLE COMPLETE 3+V*L*
3 series · 3 of 3 positions shown · non-contrast
Comparison: [DATE] [DATE] and [DATE] ankle

CLINICAL DATA: Pain in left foot x 1 month with no known injury, pt
states that the pain got worse when pt jumped out of a truck and
landed on a construction site at [employer] night; most pain
in bottom of left foot from beginning of arch to the heel per pt;
minor pain in left ankle; hx left ankle fx last year per pt

EXAM:
LEFT ANKLE COMPLETE - 3+ VIEW

[x ankle ap left]
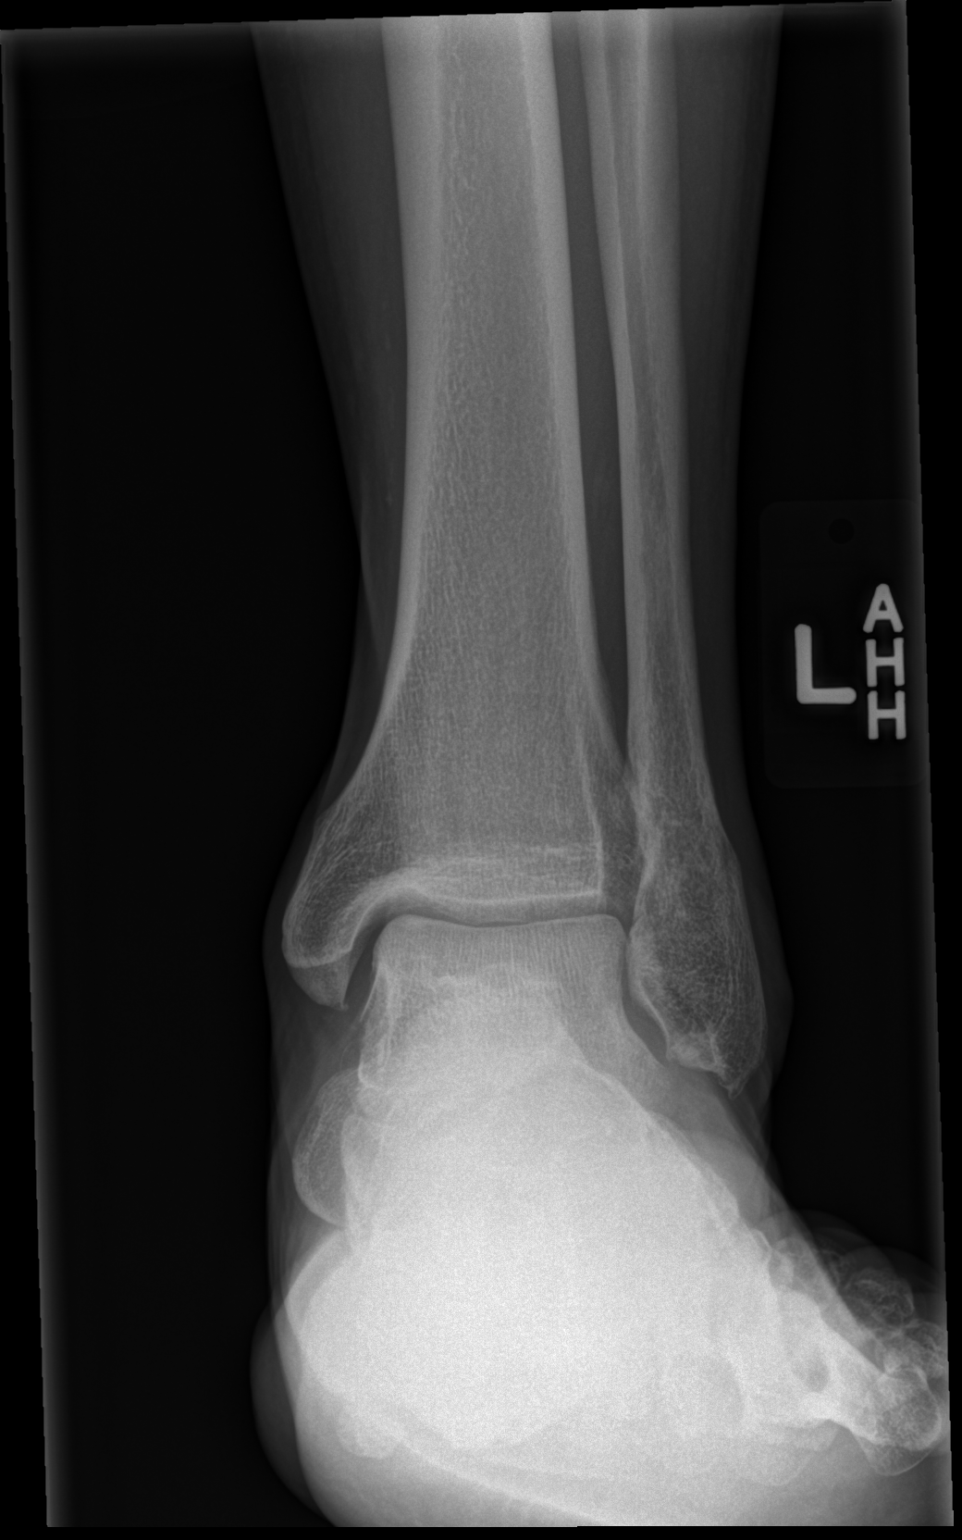

[x ankle obl left]
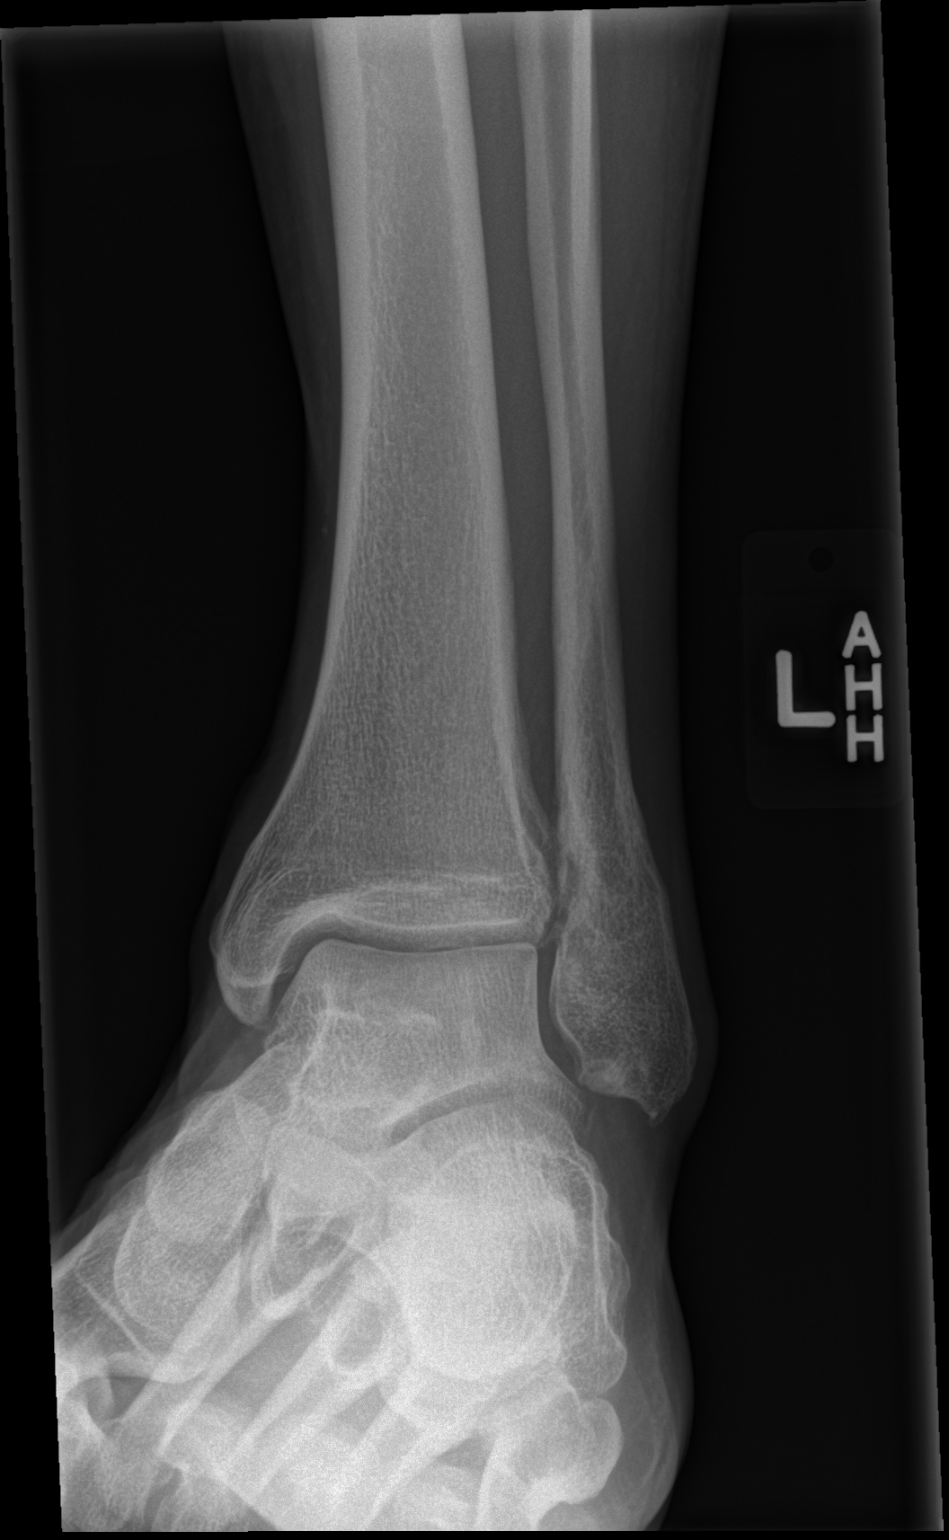

[x ankle lat left]
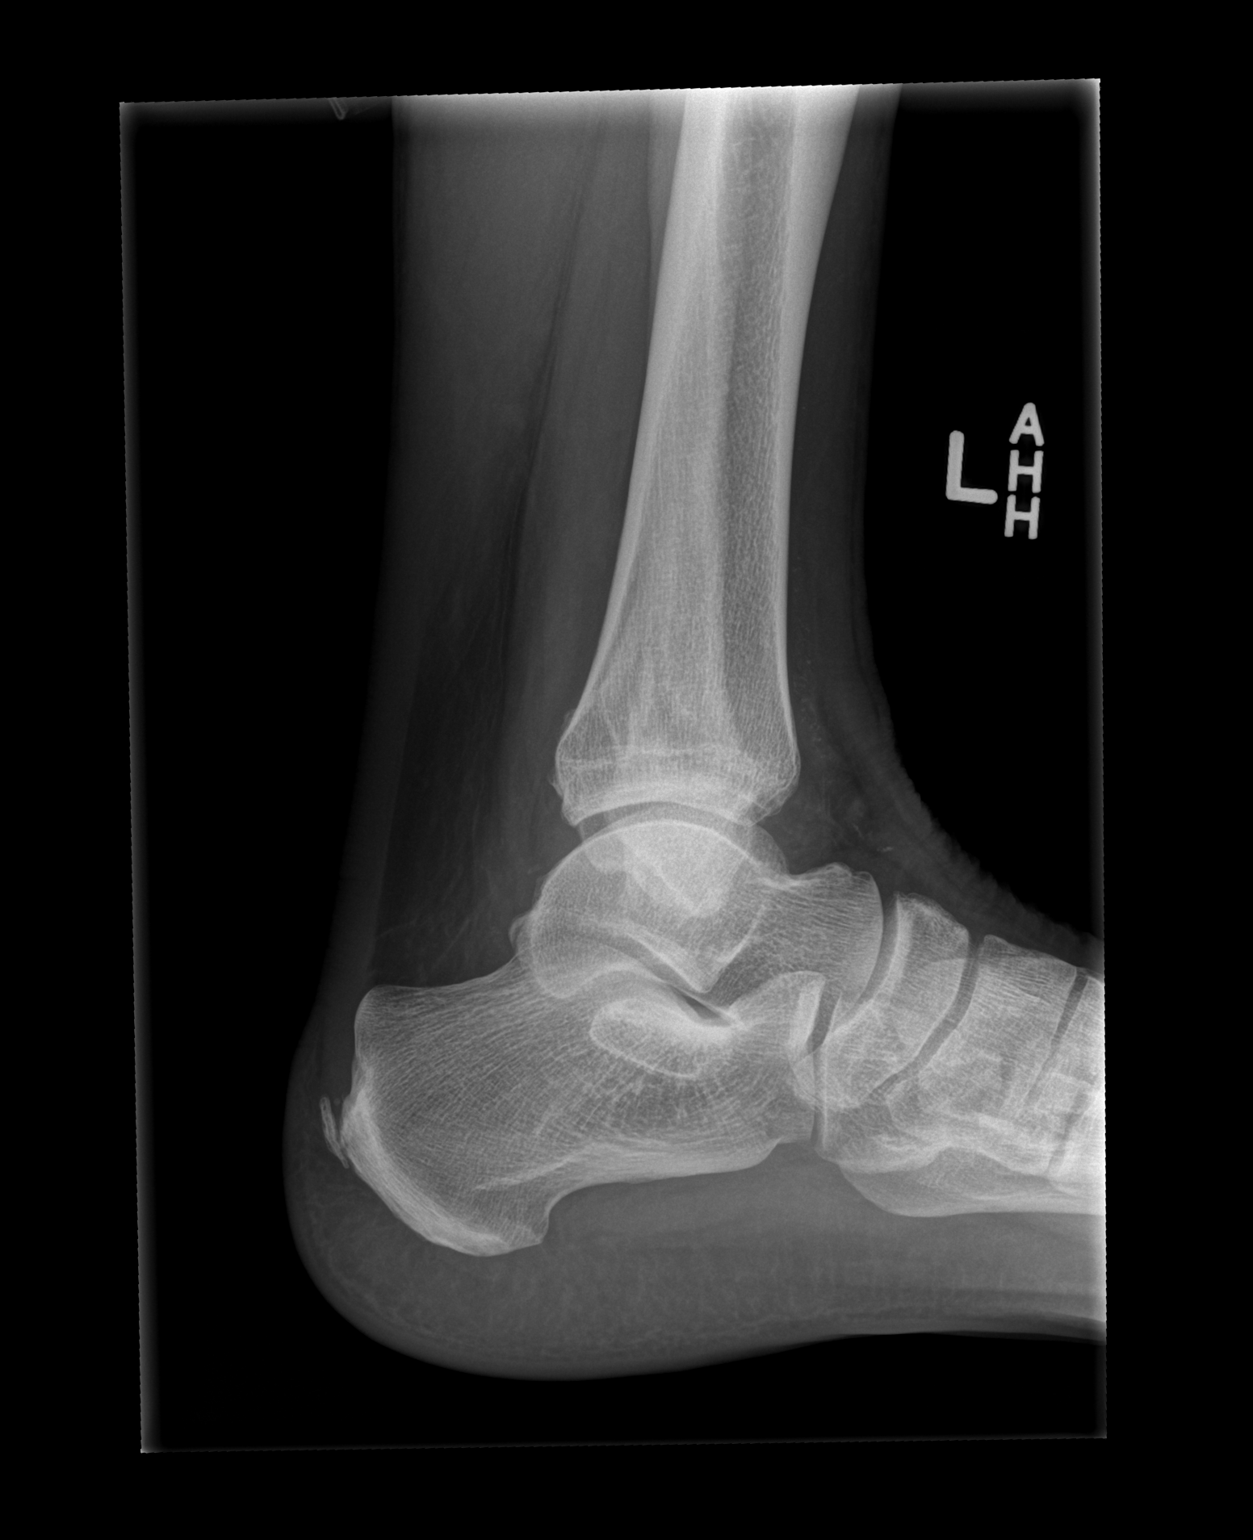

[3 of 3 positions shown; findings below may reference images not displayed]

FINDINGS: There is deformity of the lateral malleolus from previous fracture.
No acute fracture or dislocation identified. Mortise is intact.
Small Achilles spur is present. No radiopaque foreign body or soft
tissue gas.
IMPRESSION: No evidence for acute  abnormality.

## 2014-04-23 MED ORDER — HYDROCODONE-ACETAMINOPHEN 5-325 MG PO TABS: 1.0000 | ORAL_TABLET | Freq: Four times a day (QID) | ORAL | Status: DC | PRN

## 2014-04-23 MED ORDER — HYDROCODONE-ACETAMINOPHEN 5-325 MG PO TABS
1.0000 | ORAL_TABLET | Freq: Once | ORAL | Status: AC
  Administered 2014-04-23: 1 via ORAL
  Filled 2014-04-23: qty 1

## 2014-04-23 NOTE — ED Provider Notes (Signed)
CSN: [id number]     Arrival date & time 04/23/14  1233 History  This chart was scribed for non-physician practitioner Kerrie Buffalo, NP, working with Joya Gaskins, MD by Littie Deeds, ED Scribe. This patient was seen in room WTR7/WTR7 and the patient's care was started at 1:59 PM.      Chief Complaint  Patient presents with  . Foot Injury   Patient is a 64 y.o. male presenting with foot injury. The history is provided by the patient. No language interpreter was used.  Foot Injury Location:  Foot Time since incident:  1 day Injury: yes   Foot location:  L foot Pain details:    Quality:  Sharp   Radiates to:  Does not radiate   Severity:  Severe   Onset quality:  Sudden   Duration:  1 day   Progression:  Unchanged Chronicity:  New Dislocation: no   Prior injury to area:  Yes Associated symptoms: no fever    HPI Comments: Kevin Maxwell is a 64 y.o. male who presents to the Emergency Department complaining of sudden onset, sharp left foot pain with swelling secondary to an injury last night. Patient reports that he was at a [employer] last night that was undergoing construction when he jumped off a trailer and landed on a chunk of concrete. His shoes were on when he injured his foot. He states most of the pain is in his heel. He has tried using an ace wrap, but notes that he has pain when taking it off. Patient has not tried any OTC medications and has never taken Tylenol. He notes that he did fracture his left ankle about a year ago.   Past Medical History  Diagnosis Date  . Shoulder problem     left shoulder   Past Surgical History  Procedure Laterality Date  . Hernia repair     History reviewed. No pertinent family history. History  Substance Use Topics  . Smoking status: Current Every Day Smoker -- 0.50 packs/day    Types: Cigarettes  . Smokeless tobacco: Never Used  . Alcohol Use: Yes     Comment: quart to 1.5 after work each day of beer    Review of Systems   Constitutional: Negative for fever.  Musculoskeletal: Positive for joint swelling.       Left foot pain  all other systems negative    Allergies  Aspirin and Ibuprofen  Home Medications   Prior to Admission medications   Medication Sig Start Date End Date Taking? Authorizing Provider  amLODipine (NORVASC) 2.5 MG tablet Take 1 tablet (2.5 mg total) by mouth daily. 01/12/14   Doris Cheadle, MD  HYDROcodone-acetaminophen (NORCO) 5-325 MG per tablet Take 1 tablet by mouth every 6 (six) hours as needed for moderate pain. 04/23/14   Janene Yousuf Orlene Och, NP  methocarbamol (ROBAXIN) 500 MG tablet Take 2 tablets (1,000 mg total) by mouth 4 (four) times daily as needed (Pain). 02/20/14   Nicole Pisciotta, PA-C  predniSONE (STERAPRED UNI-PAK) 10 MG tablet Take by mouth daily. Day 1: take 6 tabs.  Day 2: 5 tabs  Day 3: 4 tabs  Day 4: 3 tabs  Day 5: 2 tabs  Day 6: 1 tab 01/09/14   Trixie Dredge, PA-C   BP 140/90 mmHg  Pulse 90  Temp(Src) 98.6 F (37 C) (Oral)  Resp 16  SpO2 100% Physical Exam  Constitutional: He is oriented to person, place, and time. He appears well-developed and well-nourished.  HENT:  Head: Normocephalic.  Eyes: Conjunctivae and EOM are normal.  Neck: Normal range of motion. Neck supple.  Cardiovascular: Normal rate.   Pulses:      Dorsalis pedis pulses are 2+ on the right side, and 2+ on the left side.  Pulses 2+ and equal. Circulation adequate. Strong DP pulses.  Pulmonary/Chest: Effort normal.  Abdominal: Soft. There is no tenderness.  Musculoskeletal: Normal range of motion.       Left ankle: He exhibits swelling. He exhibits normal range of motion, no deformity, no laceration and normal pulse. Tenderness. Lateral malleolus tenderness found. Achilles tendon normal.       Feet:  Swelling to foot and ankle. Normal Achilles. Tender left heel with palpation. Pain radiates to the arch.   Neurological: He is alert and oriented to person, place, and time. No cranial nerve  deficit.  Good touch sensation.  Skin: Skin is warm and dry.  Psychiatric: He has a normal mood and affect. His behavior is normal.  Nursing note and vitals reviewed.   ED Course  Procedures  DIAGNOSTIC STUDIES: Oxygen Saturation is 100% on room air, normal by my interpretation.    COORDINATION OF CARE: 2:05 PM-Discussed treatment plan which includes ankle brace, crutches, ice, and pain medication with pt at bedside and pt agreed to plan. Orthopedic contact information given for follow-up.   Labs Review Labs Reviewed - No data to display  Imaging Review Dg Ankle Complete Left  04/23/2014   CLINICAL DATA:  Pain in left foot x 1 month with no known injury, pt states that the pain got worse when pt jumped out of a truck and landed on a construction site at [employer] last night; most pain in bottom of left foot from beginning of arch to the heel per pt; minor pain in left ankle; hx left ankle fx last year per pt  EXAM: LEFT ANKLE COMPLETE - 3+ VIEW  COMPARISON:  04/23/2014 5 and 01/21/2013 ankle  FINDINGS: There is deformity of the lateral malleolus from previous fracture. No acute fracture or dislocation identified. Mortise is intact. Small Achilles spur is present. No radiopaque foreign body or soft tissue gas.  IMPRESSION: No evidence for acute  abnormality.   Electronically Signed   By: Norva Pavlov M.D.   On: 04/23/2014 13:43   Dg Foot Complete Left  04/23/2014   CLINICAL DATA:  Left foot pain x1 month, worse since jumping out of truck yesterday  EXAM: LEFT FOOT - COMPLETE 3+ VIEW  COMPARISON:  None.  FINDINGS: No fracture or dislocation is seen.  The joint spaces are preserved.  The visualized soft tissues are unremarkable.  Mild residual deformity related to healed distal fibular fracture.  IMPRESSION: No fracture or dislocation is seen.   Electronically Signed   By: Charline Bills M.D.   On: 04/23/2014 13:38    MDM  64 y.o. male with left foot and ankle pain s/p injury one  day ago. ASO applied, crutches, ice and elevation. Patient stable for d/c without neurovascular deficits. He will follow up with ortho if symptoms persist. He will return to the ED as needed. Will treat for pain and inflammation. Patient voices understanding and agrees with plan.  Final diagnoses:  Contusion of left foot, initial encounter  Ankle sprain, left, initial encounter   I personally performed the services described in this documentation, which was scribed in my presence. The recorded information has been reviewed and is accurate.   [place], [place] 04/24/14 [phone]  Joya Gaskinsonald W Wickline, MD 04/24/14 [phone]

## 2014-04-23 NOTE — ED Notes (Signed)
Pt c/o L foot injury and pain x 1 day after jumping off a trailer and landing on a chunk of concrete.  Pain score 9/10. Swelling noted to L feet.  Pt has not taken anything for pain.  Pt was able to ambulate into room.

## 2014-05-21 ENCOUNTER — Emergency Department (HOSPITAL_COMMUNITY): Payer: Self-pay

## 2014-05-21 ENCOUNTER — Emergency Department (HOSPITAL_COMMUNITY)
Admission: EM | Admit: 2014-05-21 | Discharge: 2014-05-21 | Disposition: A | Discharge status: Home / Self Care | Payer: Self-pay | Attending: Emergency Medicine | Admitting: Emergency Medicine

## 2014-05-21 ENCOUNTER — Encounter (HOSPITAL_COMMUNITY): Payer: Self-pay | Admitting: Emergency Medicine

## 2014-05-21 DIAGNOSIS — Z720 Tobacco use: Secondary | ICD-10-CM | POA: Insufficient documentation

## 2014-05-21 DIAGNOSIS — M722 Plantar fascial fibromatosis: Secondary | ICD-10-CM | POA: Insufficient documentation

## 2014-05-21 DIAGNOSIS — M7732 Calcaneal spur, left foot: Secondary | ICD-10-CM | POA: Insufficient documentation

## 2014-05-21 DIAGNOSIS — Z79899 Other long term (current) drug therapy: Secondary | ICD-10-CM | POA: Insufficient documentation

## 2014-05-21 IMAGING — CR DG FOOT COMPLETE 3+V*L*
3 series · 3 of 3 positions shown · non-contrast
Comparison: [DATE]

CLINICAL DATA: Pain for 1 month. Patient jumped onto hard surface 1
month prior with pain since that time

EXAM:
LEFT FOOT - COMPLETE 3+ VIEW

[x foot ap left]
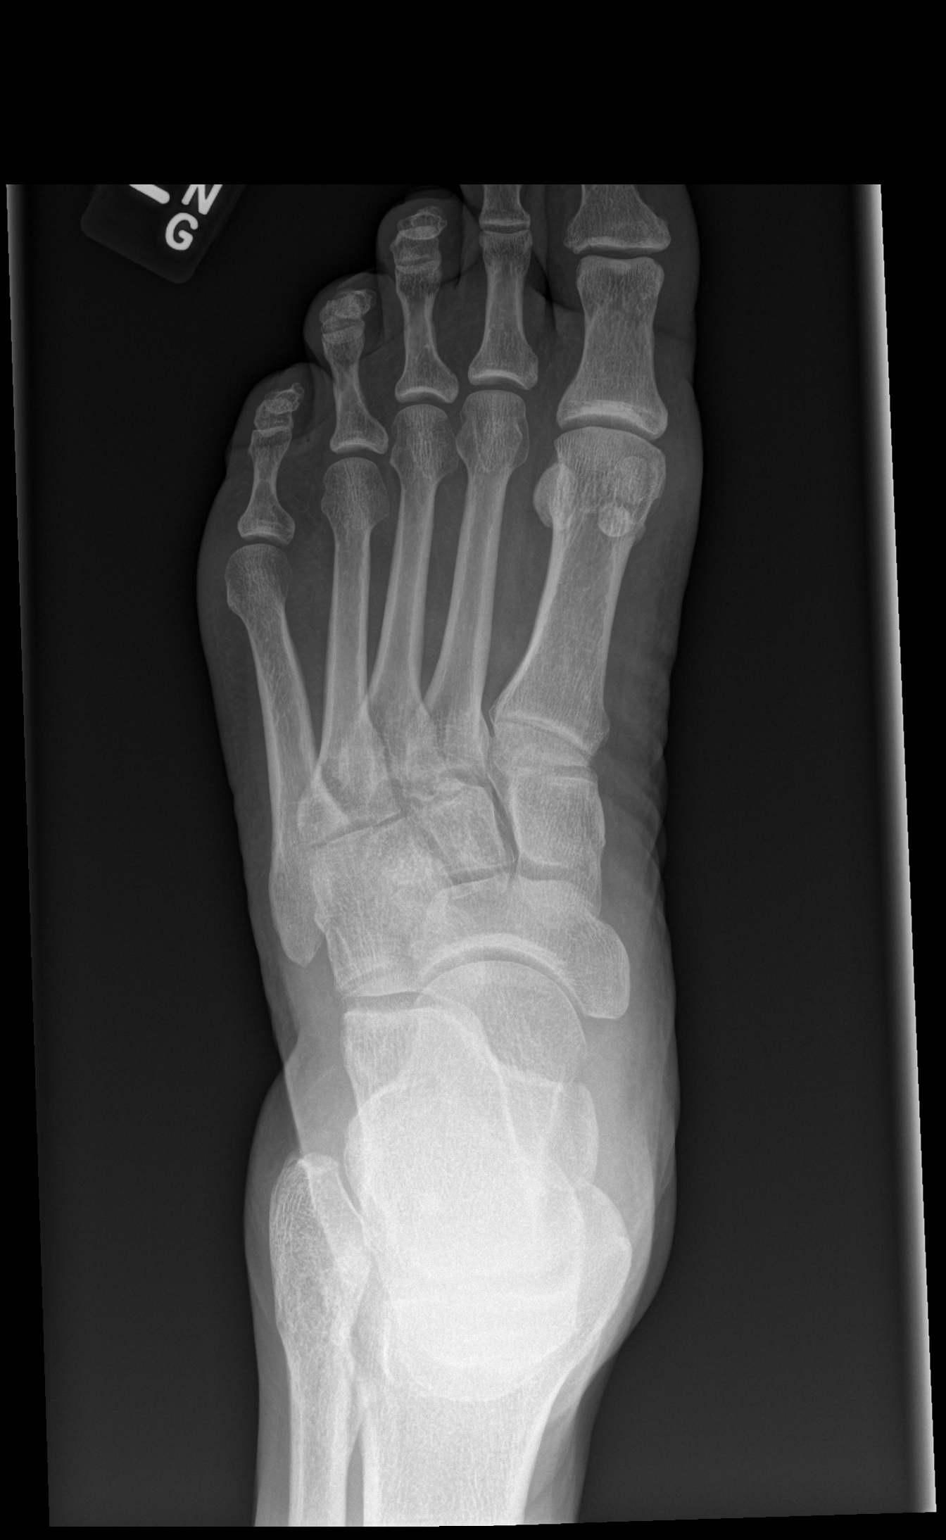

[x foot obl left]
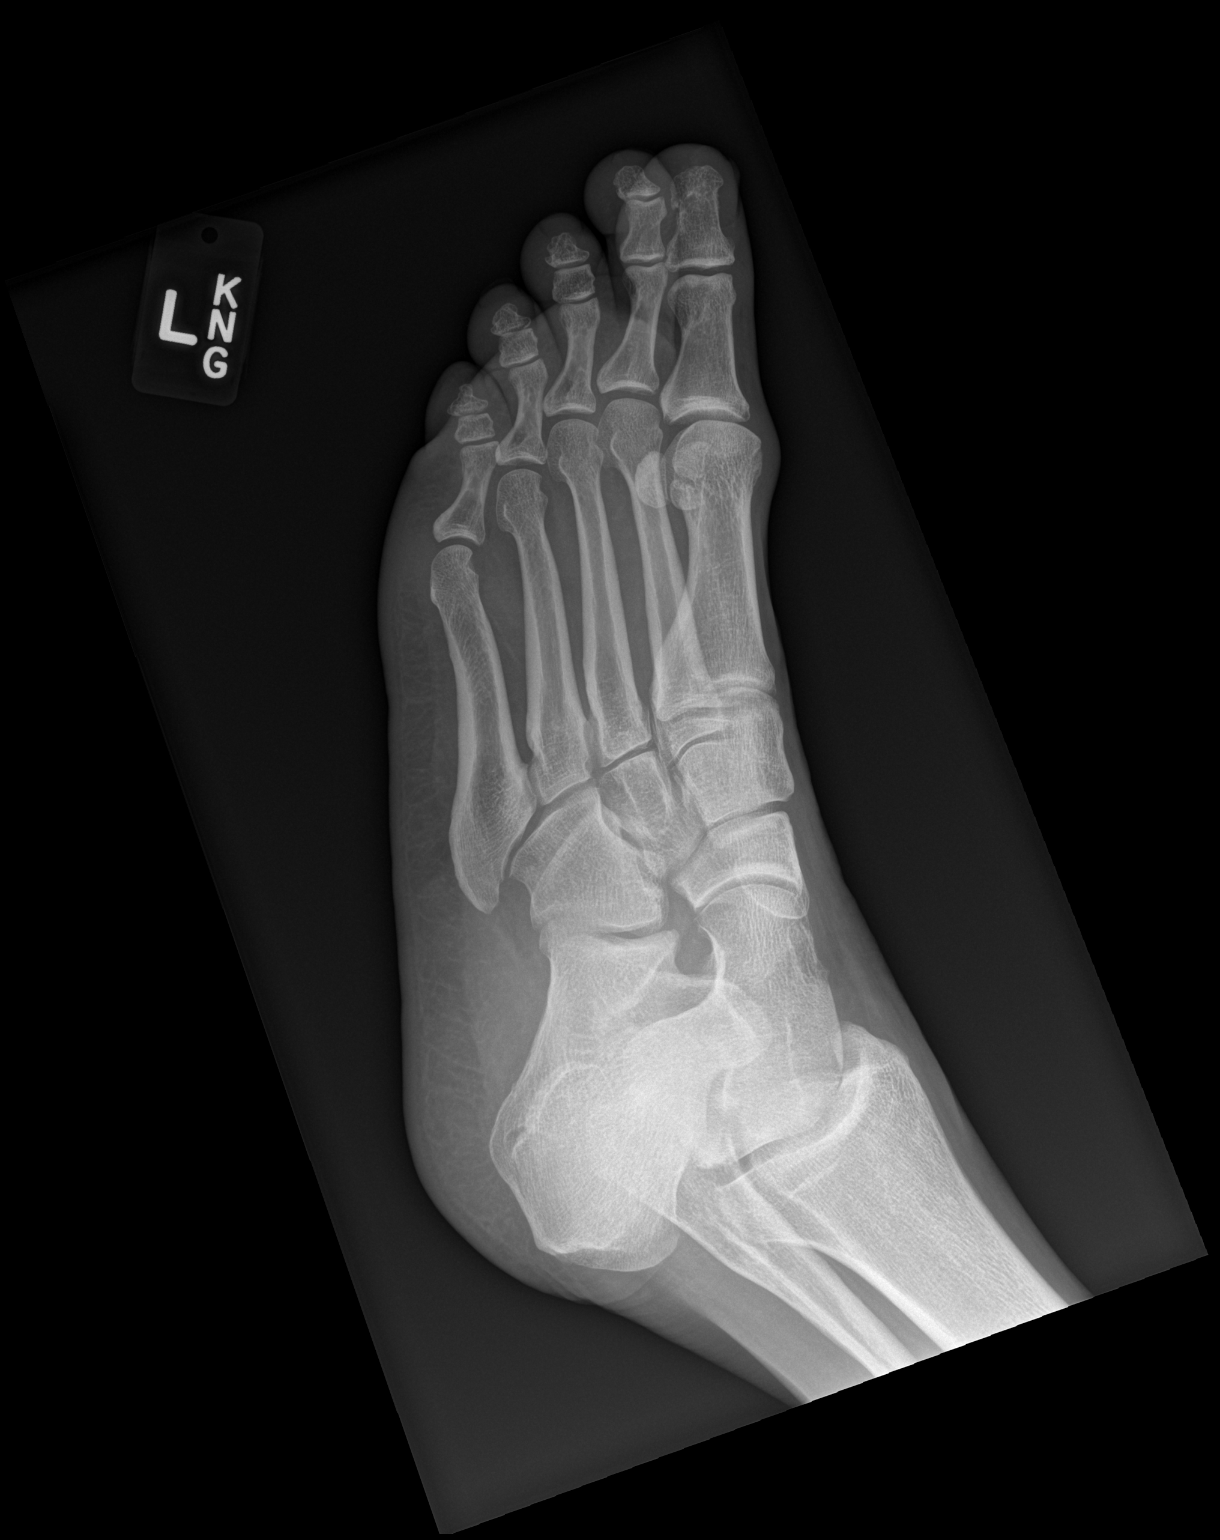

[x foot lat left]
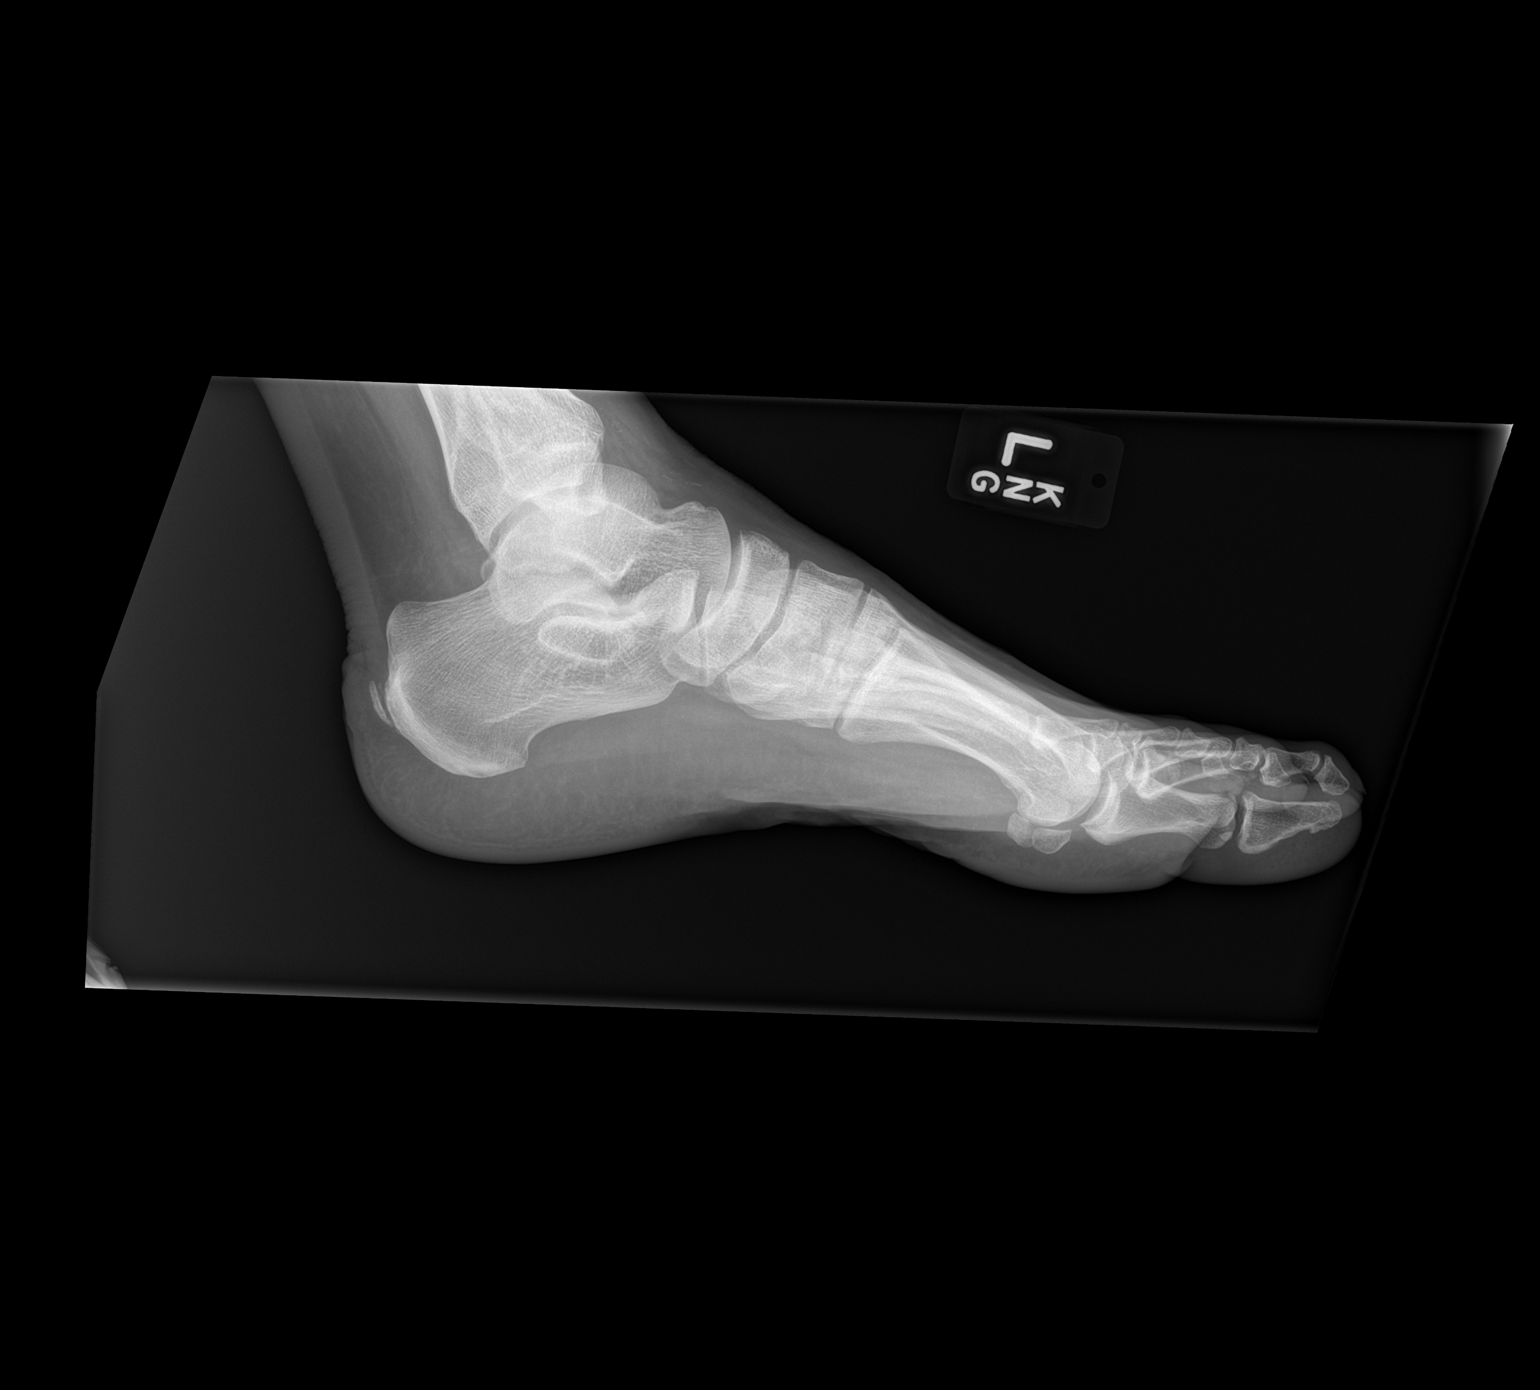

[3 of 3 positions shown; findings below may reference images not displayed]

FINDINGS: Frontal, oblique and lateral views were obtained. There is evidence
of an old healed fracture of the distal fibula with subtle
remodeling in this area, stable. There is no demonstrable acute
fracture or dislocation. Joint spaces appear intact. No erosive
change. There is a posterior calcaneal spur.
IMPRESSION: No acute fracture or dislocation. No appreciable arthropathy. There
is a spur arising from the posterior calcaneus. Evidence of old
fracture distal fibula with remodeling.

## 2014-05-21 MED ORDER — TRAMADOL HCL 50 MG PO TABS: 50.0000 mg | ORAL_TABLET | Freq: Four times a day (QID) | ORAL | Status: DC | PRN

## 2014-05-21 NOTE — ED Provider Notes (Signed)
CSN: [id number]     Arrival date & time 05/21/14  1328 History  This chart was scribed for a non-physician practitioner, Kathrynn Speedobyn M Hennessy Bartel, PA-C working with Nelva Nayobert Beaton, MD by [place] Peace, ED Scribe. The patient was seen in WTR8/WTR8. The patient's care was started at 2:03 PM.    Chief Complaint  Patient presents with  . Foot Pain      Patient is a 64 y.o. male presenting with lower extremity pain. The history is provided by the patient. No language interpreter was used.  Foot Pain   HPI Comments: Kevin Maxwell is a 64 y.o. male who presents to the Emergency Department complaining of left foot pain x 1 month that occurred while pt was at work and jumped off a truck onto a slab of concrete. He reports pain is primarily to his heel. Pain is worst in the worst in the morning when he wakes up. Pt explains that he is the only one in his house that works so he has been trying to put off coming to the ED to be seen so he can continue to work and bring in some money. He states his boss noticed that he has been having a hard time trying to work and advised him to come be evaluated before problem worsens. Pain exacerbated with ambulation and bearing weight. Pt is current everyday smoker.    Past Medical History  Diagnosis Date  . Shoulder problem     left shoulder   Past Surgical History  Procedure Laterality Date  . Hernia repair     No family history on file. History  Substance Use Topics  . Smoking status: Current Every Day Smoker -- 0.50 packs/day    Types: Cigarettes  . Smokeless tobacco: Never Used  . Alcohol Use: Yes     Comment: quart to 1.5 after work each day of beer    Review of Systems  Musculoskeletal:       Left foot pain.   All other systems reviewed and are negative.     Allergies  Aspirin and Ibuprofen  Home Medications   Prior to Admission medications   Medication Sig Start Date End Date Taking? Authorizing Provider  amLODipine (NORVASC) 2.5 MG tablet Take 1  tablet (2.5 mg total) by mouth daily. 01/12/14   Doris Cheadleeepak Advani, MD  HYDROcodone-acetaminophen (NORCO) 5-325 MG per tablet Take 1 tablet by mouth every 6 (six) hours as needed for moderate pain. 04/23/14   Hope Orlene OchM Neese, NP  methocarbamol (ROBAXIN) 500 MG tablet Take 2 tablets (1,000 mg total) by mouth 4 (four) times daily as needed (Pain). 02/20/14   Nicole Pisciotta, PA-C  predniSONE (STERAPRED UNI-PAK) 10 MG tablet Take by mouth daily. Day 1: take 6 tabs.  Day 2: 5 tabs  Day 3: 4 tabs  Day 4: 3 tabs  Day 5: 2 tabs  Day 6: 1 tab 01/09/14   Trixie DredgeEmily West, PA-C  traMADol (ULTRAM) 50 MG tablet Take 1 tablet (50 mg total) by mouth every 6 (six) hours as needed. 05/21/14   Jude Linck M Vannessa Godown, PA-C   BP 151/103 mmHg  Pulse 74  Temp(Src) 98.6 F (37 C) (Oral)  Resp 16  SpO2 100% Physical Exam  Constitutional: He is oriented to person, place, and time. He appears well-developed and well-nourished. No distress.  HENT:  Head: Normocephalic and atraumatic.  Eyes: Conjunctivae and EOM are normal.  Neck: Normal range of motion. Neck supple.  Cardiovascular: Normal rate, regular rhythm and normal heart sounds.  Pulmonary/Chest: Effort normal and breath sounds normal.  Musculoskeletal: Normal range of motion. He exhibits no edema.  L foot TTP over plantar aspect over plantar fascia and into heel. No erythema or swelling. +2 DP/PT pulse. Achilles tendon normal.  Neurological: He is alert and oriented to person, place, and time.  Skin: Skin is warm and dry.  Psychiatric: He has a normal mood and affect. His behavior is normal.  Nursing note and vitals reviewed.   ED Course  Procedures (including critical care time) Labs Review Labs Reviewed - No data to display  Imaging Review Dg Foot Complete Left  05/21/2014   CLINICAL DATA:  Pain for 1 month. Patient jumped onto hard surface 1 month prior with pain since that time  EXAM: LEFT FOOT - COMPLETE 3+ VIEW  COMPARISON:  April 23, 2014  FINDINGS: Frontal,  oblique and lateral views were obtained. There is evidence of an old healed fracture of the distal fibula with subtle remodeling in this area, stable. There is no demonstrable acute fracture or dislocation. Joint spaces appear intact. No erosive change. There is a posterior calcaneal spur.  IMPRESSION: No acute fracture or dislocation. No appreciable arthropathy. There is a spur arising from the posterior calcaneus. Evidence of old fracture distal fibula with remodeling.   Electronically Signed   By: Bretta Bang III M.D.   On: 05/21/2014 14:43     EKG Interpretation None     Medications - No data to display  2:08 PM- Treatment plan was discussed with patient who verbalizes understanding and agrees.  Will obtain xray. Discussed this may be plantar fasciitis.  MDM   Final diagnoses:  Plantar fasciitis of left foot  Heel spur, left   NAD. Neurovascularly intact. Exam consistent with plantar fasciitis, given x-ray showing heel spur, this is the most likely diagnosis. Discussed he may roll a tennis ball or lacrosse ball beneath his foot to stretch his plantar fascia. Tramadol for pain. Advised follow-up with PCP and possibly orthopedics if symptoms continue. Stable for discharge. Return precautions given. Patient states understanding of treatment care plan and is agreeable.  I personally performed the services described in this documentation, which was scribed in my presence. The recorded information has been reviewed and is accurate.   Kathrynn Speed, PA-C 05/21/14 1913  Nelva Nay, MD 05/22/14 [phone]

## 2014-05-21 NOTE — Discharge Instructions (Signed)
Take tramadol as directed for pain. Use a tennis ball or a lacrosse ball to roll beneath your foot, you may also use a frozen water bottle.  Plantar Fasciitis Plantar fasciitis is a common condition that causes foot pain. It is soreness (inflammation) of the band of tough fibrous tissue on the bottom of the foot that runs from the heel bone (calcaneus) to the ball of the foot. The cause of this soreness may be from excessive standing, poor fitting shoes, running on hard surfaces, being overweight, having an abnormal walk, or overuse (this is common in runners) of the painful foot or feet. It is also common in aerobic exercise dancers and ballet dancers. SYMPTOMS  Most people with plantar fasciitis complain of:  Severe pain in the morning on the bottom of their foot especially when taking the first steps out of bed. This pain recedes after a few minutes of walking.  Severe pain is experienced also during walking following a long period of inactivity.  Pain is worse when walking barefoot or up stairs DIAGNOSIS   Your caregiver will diagnose this condition by examining and feeling your foot.  Special tests such as X-rays of your foot, are usually not needed. PREVENTION   Consult a sports medicine professional before beginning a new exercise program.  Walking programs offer a good workout. With walking there is a lower chance of overuse injuries common to runners. There is less impact and less jarring of the joints.  Begin all new exercise programs slowly. If problems or pain develop, decrease the amount of time or distance until you are at a comfortable level.  Wear good shoes and replace them regularly.  Stretch your foot and the heel cords at the back of the ankle (Achilles tendon) both before and after exercise.  Run or exercise on even surfaces that are not hard. For example, asphalt is better than pavement.  Do not run barefoot on hard surfaces.  If using a treadmill, vary the  incline.  Do not continue to workout if you have foot or joint problems. Seek professional help if they do not improve. HOME CARE INSTRUCTIONS   Avoid activities that cause you pain until you recover.  Use ice or cold packs on the problem or painful areas after working out.  Only take over-the-counter or prescription medicines for pain, discomfort, or fever as directed by your caregiver.  Soft shoe inserts or athletic shoes with air or gel sole cushions may be helpful.  If problems continue or become more severe, consult a sports medicine caregiver or your own health care provider. Cortisone is a potent anti-inflammatory medication that may be injected into the painful area. You can discuss this treatment with your caregiver. MAKE SURE YOU:   Understand these instructions.  Will watch your condition.  Will get help right away if you are not doing well or get worse. Document Released: 11/05/2000 Document Revised: 05/05/2011 Document Reviewed: 01/05/2008 [facility] Patient Information 2015 [place], [place]. This information is not intended to replace advice given to you by your health care provider. Make sure you discuss any questions you have with your health care provider.  Heel Spur A heel spur is a hook of bone that can form on the calcaneus (the heel bone and the largest bone of the foot). Heel spurs are often associated with plantar fasciitis and usually come in people who have had the problem for an extended period of time. The cause of the relationship is unknown. The pain associated with  them is thought to be caused by an inflammation (soreness and redness) of the plantar fascia rather than the spur itself. The plantar fascia is a thick fibrous like tissue that runs from the calcaneus (heel bone) to the ball of the foot. This strong, tight tissue helps maintain the arch of your foot. It helps distribute the weight across your foot as you walk or run. Stresses placed on the plantar fascia  can be tremendous. When it is inflamed normal activities become painful. Pain is worse in the morning after sleeping. After sleeping the plantar fascia is tight. The first movements stretch the fascia and this causes pain. As the tendon loosens, the pain usually gets better. It often returns with too much standing or walking.  About 70% of patients with plantar fasciitis have a heel spur. About half of people without foot pain also have heel spurs. DIAGNOSIS  The diagnosis of a heel spur is made by X-ray. The X-ray shows a hook of bone protruding from the bottom of the calcaneus at the point where the plantar fascia is attached to the heel bone.  TREATMENT  It is necessary to find out what is causing the stretching of the plantar fascia. If the cause is over-pronation (flat feet), orthotics and proper foot ware may help.  Stretching exercises, losing weight, wearing shoes that have a cushioned heel that absorbs shock, and elevating the heel with the use of a heel cradle, heel cup, or orthotics may all help. Heel cradles and heel cups provide extra comfort and cushion to the heel, and reduce the amount of shock to the sore area. AVOIDING THE PAIN OF PLANTAR FASCIITIS AND HEEL SPURS  Consult a sports medicine professional before beginning a new exercise program.  Walking programs offer a good workout. There is a lower chance of overuse injuries common to the runners. There is less impact and less jarring of the joints.  Begin all new exercise programs slowly. If problems or pains develop, decrease the amount of time or distance until you are at a comfortable level.  Wear good shoes and replace them regularly.  Stretch your foot and the heel cords at the back of the ankle (Achilles tendons) both before and after exercise.  Run or exercise on even surfaces that are not hard. For example, asphalt is better than pavement.  Do not run barefoot on hard surfaces.  If using a treadmill, vary the  incline.  Do not continue to workout if you have foot or joint problems. Seek professional help if they do not improve. HOME CARE INSTRUCTIONS   Avoid activities that cause you pain until you recover.  Use ice or cold packs to the problem or painful areas after working out.  Only take over-the-counter or prescription medicines for pain, discomfort, or fever as directed by your caregiver.  Soft shoe inserts or athletic shoes with air or gel sole cushions may be helpful.  If problems continue or become more severe, consult a sports medicine caregiver. Cortisone is a potent anti-inflammatory medication that may be injected into the painful area. You can discuss this treatment with your caregiver. MAKE SURE YOU:   Understand these instructions.  Will watch your condition.  Will get help right away if you are not doing well or get worse. Document Released: 03/19/2005 Document Revised: 05/05/2011 Document Reviewed: 04/13/2013 [facility] Patient Information 2015 [place], [place]. This information is not intended to replace advice given to you by your health care provider. Make sure you discuss any  questions you have with your health care provider. ° °

## 2014-05-21 NOTE — ED Notes (Signed)
Pt c/o lt foot pain x 1 month.  States he was here for same a month ago.

## 2014-06-15 ENCOUNTER — Ambulatory Visit: Payer: Self-pay | Attending: Internal Medicine | Admitting: Internal Medicine

## 2014-06-15 ENCOUNTER — Encounter: Payer: Self-pay | Admitting: Internal Medicine

## 2014-06-15 VITALS — BP 155/90 | HR 80 | Temp 98.6°F | Resp 16 | Wt 168.2 lb

## 2014-06-15 DIAGNOSIS — M79672 Pain in left foot: Secondary | ICD-10-CM | POA: Insufficient documentation

## 2014-06-15 DIAGNOSIS — Z720 Tobacco use: Secondary | ICD-10-CM | POA: Insufficient documentation

## 2014-06-15 DIAGNOSIS — I10 Essential (primary) hypertension: Secondary | ICD-10-CM | POA: Insufficient documentation

## 2014-06-15 DIAGNOSIS — M7732 Calcaneal spur, left foot: Secondary | ICD-10-CM | POA: Insufficient documentation

## 2014-06-15 DIAGNOSIS — M25512 Pain in left shoulder: Secondary | ICD-10-CM | POA: Insufficient documentation

## 2014-06-15 DIAGNOSIS — F17200 Nicotine dependence, unspecified, uncomplicated: Secondary | ICD-10-CM

## 2014-06-15 MED ORDER — AMLODIPINE BESYLATE 2.5 MG PO TABS: 2.5000 mg | ORAL_TABLET | Freq: Every day | ORAL | Status: DC

## 2014-06-15 MED ORDER — TRAMADOL HCL 50 MG PO TABS: 50.0000 mg | ORAL_TABLET | Freq: Two times a day (BID) | ORAL | Status: DC | PRN

## 2014-06-15 NOTE — Progress Notes (Signed)
MRN: [id number] Name: Kevin Maxwell  Sex: male Age: 64 y.o. DOB: March 20, 1950  Allergies: Aspirin and Ibuprofen  Chief Complaint  Patient presents with  . Shoulder Pain    HPI: Patient is 64 y.o. male who history of hypertension currently not taking medication, today's blood pressure is borderline elevated, denies any headache dizziness chest and shortness of breath, last month he her went to the emergency room with symptoms of left heel pain , EMR reviewed patient had x-ray done which reported to heel spur, patient was diagnosed with plantar fasciitis and was prescribed pain medication and was advised for comfortable shoes as per patient his symptoms are getting worse he then out of his pain medication, patient also has on and off left shoulder pain, denies any recent fall or trauma.  Past Medical History  Diagnosis Date  . Shoulder problem     left shoulder    Past Surgical History  Procedure Laterality Date  . Hernia repair        Medication List       This list is accurate as of: 06/15/14  5:11 PM.  Always use your most recent med list.               amLODipine 2.5 MG tablet  Commonly known as:  NORVASC  Take 1 tablet (2.5 mg total) by mouth daily.     HYDROcodone-acetaminophen 5-325 MG per tablet  Commonly known as:  NORCO  Take 1 tablet by mouth every 6 (six) hours as needed for moderate pain.     methocarbamol 500 MG tablet  Commonly known as:  ROBAXIN  Take 2 tablets (1,000 mg total) by mouth 4 (four) times daily as needed (Pain).     predniSONE 10 MG tablet  Commonly known as:  STERAPRED UNI-PAK  Take by mouth daily. Day 1: take 6 tabs.  Day 2: 5 tabs  Day 3: 4 tabs  Day 4: 3 tabs  Day 5: 2 tabs  Day 6: 1 tab     traMADol 50 MG tablet  Commonly known as:  ULTRAM  Take 1 tablet (50 mg total) by mouth every 12 (twelve) hours as needed.        Meds ordered this encounter  Medications  . amLODipine (NORVASC) 2.5 MG tablet    Sig: Take 1 tablet (2.5 mg  total) by mouth daily.    Dispense:  90 tablet    Refill:  3  . traMADol (ULTRAM) 50 MG tablet    Sig: Take 1 tablet (50 mg total) by mouth every 12 (twelve) hours as needed.    Dispense:  30 tablet    Refill:  0    Immunization History  Administered Date(s) Administered  . Pneumococcal Polysaccharide-23 01/12/2014    History reviewed. No pertinent family history.  History  Substance Use Topics  . Smoking status: Current Every Day Smoker -- 0.50 packs/day    Types: Cigarettes  . Smokeless tobacco: Never Used  . Alcohol Use: Yes     Comment: quart to 1.5 after work each day of beer    Review of Systems   As noted in HPI  Filed Vitals:   06/15/14 1634  BP: 155/90  Pulse: 80  Temp: 98.6 F (37 C)  Resp: 16    Physical Exam  Physical Exam  Cardiovascular: Normal rate.   Pulmonary/Chest: Breath sounds normal. No respiratory distress. He has no wheezes. He has no rales.  Musculoskeletal:  Left shoulder good range of  motion minimal tenderness anteriorly, equal strength both upper extremity  Left foot tenderness with palpation on left heel, 2+ dorsalis pedis pulse, no erythema or swelling.    CBC    Component Value Date/Time   WBC 5.3 09/05/2013 0920   RBC 5.16 09/05/2013 0920   HGB 17.0 12/12/2013 1954   HCT 50.0 12/12/2013 1954   PLT 251 09/05/2013 0920   MCV 87.6 09/05/2013 0920   LYMPHSABS 2.5 09/05/2013 0920   MONOABS 0.5 09/05/2013 0920   EOSABS 0.1 09/05/2013 0920   BASOSABS 0.0 09/05/2013 0920    CMP     Component Value Date/Time   NA 135* 12/12/2013 1954   K 3.9 12/12/2013 1954   CL 101 12/12/2013 1954   CO2 24 09/05/2013 0921   GLUCOSE 98 12/12/2013 1954   BUN 16 12/12/2013 1954   CREATININE 0.80 12/12/2013 1954   CREATININE 0.84 09/05/2013 0921   CALCIUM 9.1 09/05/2013 0921   PROT 7.5 09/05/2013 0921   ALBUMIN 4.7 09/05/2013 0921   AST 16 09/05/2013 0921   ALT 17 09/05/2013 0921   ALKPHOS 41 09/05/2013 0921   BILITOT 0.8 09/05/2013  0921   GFRNONAA >89 09/05/2013 0921   GFRNONAA 89* 05/09/2012 1347   GFRAA >89 09/05/2013 0921   GFRAA >90 05/09/2012 1347    Lab Results  Component Value Date/Time   CHOL 238* 09/05/2013 09:21 AM    No results found for: HGBA1C  Lab Results  Component Value Date/Time   AST 16 09/05/2013 09:21 AM    Assessment and Plan  Heel pain, left - Plan: traMADol (ULTRAM) 50 MG tablet, Ambulatory referral to Podiatry  Calcaneal spur of foot, left - Plan: Ambulatory referral to Podiatry  Essential hypertension - Plan: Resume back on amLODipine (NORVASC) 2.5 MG tablet, reevaluate on the following visit.  Smoking Advise patient to quit smoking  Left shoulder pain - Plan: traMADol (ULTRAM) 50 MG tablet     Return in about 3 months (around 09/14/2014), or if symptoms worsen or fail to improve, for hypertension.   This note has been created with Education officer, environmentalDragon speech recognition software and smart phrase technology. Any transcriptional errors are unintentional.    Doris CheadleADVANI, Oran Dillenburg, MD

## 2014-06-15 NOTE — Patient Instructions (Addendum)
Plantar Fasciitis Plantar fasciitis is a common condition that causes foot pain. It is soreness (inflammation) of the band of tough fibrous tissue on the bottom of the foot that runs from the heel bone (calcaneus) to the ball of the foot. The cause of this soreness may be from excessive standing, poor fitting shoes, running on hard surfaces, being overweight, having an abnormal walk, or overuse (this is common in runners) of the painful foot or feet. It is also common in aerobic exercise dancers and ballet dancers. SYMPTOMS  Most people with plantar fasciitis complain of:  Severe pain in the morning on the bottom of their foot especially when taking the first steps out of bed. This pain recedes after a few minutes of walking.  Severe pain is experienced also during walking following a long period of inactivity.  Pain is worse when walking barefoot or up stairs DIAGNOSIS   Your caregiver will diagnose this condition by examining and feeling your foot.  Special tests such as X-rays of your foot, are usually not needed. PREVENTION   Consult a sports medicine professional before beginning a new exercise program.  Walking programs offer a good workout. With walking there is a lower chance of overuse injuries common to runners. There is less impact and less jarring of the joints.  Begin all new exercise programs slowly. If problems or pain develop, decrease the amount of time or distance until you are at a comfortable level.  Wear good shoes and replace them regularly.  Stretch your foot and the heel cords at the back of the ankle (Achilles tendon) both before and after exercise.  Run or exercise on even surfaces that are not hard. For example, asphalt is better than pavement.  Do not run barefoot on hard surfaces.  If using a treadmill, vary the incline.  Do not continue to workout if you have foot or joint problems. Seek professional help if they do not improve. HOME CARE INSTRUCTIONS     Avoid activities that cause you pain until you recover.  Use ice or cold packs on the problem or painful areas after working out.  Only take over-the-counter or prescription medicines for pain, discomfort, or fever as directed by your caregiver.  Soft shoe inserts or athletic shoes with air or gel sole cushions may be helpful.  If problems continue or become more severe, consult a sports medicine caregiver or your own health care provider. Cortisone is a potent anti-inflammatory medication that may be injected into the painful area. You can discuss this treatment with your caregiver. MAKE SURE YOU:   Understand these instructions.  Will watch your condition.  Will get help right away if you are not doing well or get worse. Document Released: 11/05/2000 Document Revised: 05/05/2011 Document Reviewed: 01/05/2008 [facility] Patient Information 2015 [place], [place]. This information is not intended to replace advice given to you by your health care provider. Make sure you discuss any questions you have with your health care provider. DASH Eating Plan DASH stands for "Dietary Approaches to Stop Hypertension." The DASH eating plan is a healthy eating plan that has been shown to reduce high blood pressure (hypertension). Additional health benefits may include reducing the risk of type 2 diabetes mellitus, heart disease, and stroke. The DASH eating plan may also help with weight loss. WHAT DO I NEED TO KNOW ABOUT THE DASH EATING PLAN? For the DASH eating plan, you will follow these general guidelines:  Choose foods with a percent daily value for sodium  of less than 5% (as listed on the food label).  Use salt-free seasonings or herbs instead of table salt or sea salt.  Check with your health care provider or pharmacist before using salt substitutes.  Eat lower-sodium products, often labeled as "lower sodium" or "no salt added."  Eat fresh foods.  Eat more vegetables, fruits, and low-fat  dairy products.  Choose whole grains. Look for the word "whole" as the first word in the ingredient list.  Choose fish and skinless chicken or [place] more often than red meat. Limit fish, poultry, and meat to 6 oz (170 g) each day.  Limit sweets, desserts, sugars, and sugary drinks.  Choose heart-healthy fats.  Limit cheese to 1 oz (28 g) per day.  Eat more home-cooked food and less restaurant, buffet, and fast food.  Limit fried foods.  Cook foods using methods other than frying.  Limit canned vegetables. If you do use them, rinse them well to decrease the sodium.  When eating at a restaurant, ask that your food be prepared with less salt, or no salt if possible. WHAT FOODS CAN I EAT? Seek help from a dietitian for individual calorie needs. Grains Whole grain or whole wheat bread. Brown rice. Whole grain or whole wheat pasta. Quinoa, bulgur, and whole grain cereals. Low-sodium cereals. Corn or whole wheat flour tortillas. Whole grain cornbread. Whole grain crackers. Low-sodium crackers. Vegetables Fresh or frozen vegetables (raw, steamed, roasted, or grilled). Low-sodium or reduced-sodium tomato and vegetable juices. Low-sodium or reduced-sodium tomato sauce and paste. Low-sodium or reduced-sodium canned vegetables.  Fruits All fresh, canned (in natural juice), or frozen fruits. Meat and Other Protein Products Ground beef (85% or leaner), grass-fed beef, or beef trimmed of fat. Skinless chicken or [place]. Ground chicken or [place]. Pork trimmed of fat. All fish and seafood. Eggs. Dried beans, peas, or lentils. Unsalted nuts and seeds. Unsalted canned beans. Dairy Low-fat dairy products, such as skim or 1% milk, 2% or reduced-fat cheeses, low-fat ricotta or cottage cheese, or plain low-fat yogurt. Low-sodium or reduced-sodium cheeses. Fats and Oils Tub margarines without trans fats. Light or reduced-fat mayonnaise and salad dressings (reduced sodium). Avocado. Safflower, olive, or  canola oils. Natural peanut or almond butter. Other Unsalted popcorn and pretzels. The items listed above may not be a complete list of recommended foods or beverages. Contact your dietitian for more options. WHAT FOODS ARE NOT RECOMMENDED? Grains White bread. White pasta. White rice. Refined cornbread. Bagels and croissants. Crackers that contain trans fat. Vegetables Creamed or fried vegetables. Vegetables in a cheese sauce. Regular canned vegetables. Regular canned tomato sauce and paste. Regular tomato and vegetable juices. Fruits Dried fruits. Canned fruit in light or heavy syrup. Fruit juice. Meat and Other Protein Products Fatty cuts of meat. Ribs, chicken wings, bacon, sausage, bologna, salami, chitterlings, fatback, hot dogs, bratwurst, and packaged luncheon meats. Salted nuts and seeds. Canned beans with salt. Dairy Whole or 2% milk, cream, half-and-half, and cream cheese. Whole-fat or sweetened yogurt. Full-fat cheeses or blue cheese. Nondairy creamers and whipped toppings. Processed cheese, cheese spreads, or cheese curds. Condiments Onion and garlic salt, seasoned salt, table salt, and sea salt. Canned and packaged gravies. Worcestershire sauce. Tartar sauce. Barbecue sauce. Teriyaki sauce. Soy sauce, including reduced sodium. Steak sauce. Fish sauce. Oyster sauce. Cocktail sauce. Horseradish. Ketchup and mustard. Meat flavorings and tenderizers. Bouillon cubes. Hot sauce. Tabasco sauce. Marinades. Taco seasonings. Relishes. Fats and Oils Butter, stick margarine, lard, shortening, ghee, and bacon fat. Coconut, palm kernel, or palm  oils. Regular salad dressings. Other Pickles and olives. Salted popcorn and pretzels. The items listed above may not be a complete list of foods and beverages to avoid. Contact your dietitian for more information. WHERE CAN I FIND MORE INFORMATION? National Heart, Lung, and Blood Institute: [employer] Document  Released: 01/30/2011 Document Revised: 06/27/2013 Document Reviewed: 12/15/2012 [facility] Patient Information 2015 [place], [place]. This information is not intended to replace advice given to you by your health care provider. Make sure you discuss any questions you have with your health care provider.

## 2014-06-15 NOTE — Progress Notes (Signed)
Patient complains of the heal of his left foot is extremely sore Patient states that this has been going on for about two months only getting worse Patient had tried new boot and inserts with no relief Patient also complains of on going pain to his left shoulder

## 2014-09-04 ENCOUNTER — Ambulatory Visit: Payer: Self-pay | Attending: Internal Medicine | Admitting: Internal Medicine

## 2014-09-04 VITALS — BP 147/92 | HR 96 | Resp 16 | Wt 164.6 lb

## 2014-09-04 DIAGNOSIS — I10 Essential (primary) hypertension: Secondary | ICD-10-CM

## 2014-09-04 DIAGNOSIS — Z720 Tobacco use: Secondary | ICD-10-CM

## 2014-09-04 DIAGNOSIS — F17200 Nicotine dependence, unspecified, uncomplicated: Secondary | ICD-10-CM

## 2014-09-04 DIAGNOSIS — M79672 Pain in left foot: Secondary | ICD-10-CM

## 2014-09-04 DIAGNOSIS — M7732 Calcaneal spur, left foot: Secondary | ICD-10-CM

## 2014-09-04 MED ORDER — ATENOLOL 25 MG PO TABS: 12.5000 mg | ORAL_TABLET | Freq: Every day | ORAL | Status: DC

## 2014-09-04 MED ORDER — GABAPENTIN 300 MG PO CAPS: 300.0000 mg | ORAL_CAPSULE | Freq: Three times a day (TID) | ORAL | Status: DC

## 2014-09-04 NOTE — Progress Notes (Signed)
MRN: [id number] Name: Kevin Maxwell  Sex: male Age: 64 y.o. DOB: 19-Apr-1950  Allergies: Aspirin; Ibuprofen; and Norvasc  Chief Complaint  Patient presents with  . Foot Pain    HPI: Patient is 64 y.o. male who has history of hypertension was on Norvasc as per patient he has not been taking it for the last one month since it was causing him itching, today's blood pressure is borderline elevated denies any headache dizziness chest and shortness of breath, patient is to smoke cigarettes, I counseled patient to quit smoking, he also has history of left heel pain calcaneal spur, he was referred to podiatry but at that time he did not have orange card and could not schedule, is going to apply for  range card, he  is requesting some pain medication  Past Medical History  Diagnosis Date  . Shoulder problem     left shoulder    Past Surgical History  Procedure Laterality Date  . Hernia repair        Medication List       This list is accurate as of: 09/04/14  3:27 PM.  Always use your most recent med list.               atenolol 25 MG tablet  Commonly known as:  TENORMIN  Take 0.5 tablets (12.5 mg total) by mouth daily.     gabapentin 300 MG capsule  Commonly known as:  NEURONTIN  Take 1 capsule (300 mg total) by mouth 3 (three) times daily.     HYDROcodone-acetaminophen 5-325 MG per tablet  Commonly known as:  NORCO  Take 1 tablet by mouth every 6 (six) hours as needed for moderate pain.     methocarbamol 500 MG tablet  Commonly known as:  ROBAXIN  Take 2 tablets (1,000 mg total) by mouth 4 (four) times daily as needed (Pain).     predniSONE 10 MG tablet  Commonly known as:  STERAPRED UNI-PAK  Take by mouth daily. Day 1: take 6 tabs.  Day 2: 5 tabs  Day 3: 4 tabs  Day 4: 3 tabs  Day 5: 2 tabs  Day 6: 1 tab     traMADol 50 MG tablet  Commonly known as:  ULTRAM  Take 1 tablet (50 mg total) by mouth every 12 (twelve) hours as needed.        Meds ordered this  encounter  Medications  . atenolol (TENORMIN) 25 MG tablet    Sig: Take 0.5 tablets (12.5 mg total) by mouth daily.    Dispense:  90 tablet    Refill:  3  . gabapentin (NEURONTIN) 300 MG capsule    Sig: Take 1 capsule (300 mg total) by mouth 3 (three) times daily.    Dispense:  90 capsule    Refill:  3    Immunization History  Administered Date(s) Administered  . Pneumococcal Polysaccharide-23 01/12/2014    No family history on file.  History  Substance Use Topics  . Smoking status: Current Every Day Smoker -- 0.50 packs/day    Types: Cigarettes  . Smokeless tobacco: Never Used  . Alcohol Use: Yes     Comment: quart to 1.5 after work each day of beer    Review of Systems   As noted in HPI  Filed Vitals:   09/04/14 1457  BP: 147/92  Pulse: 96  Resp: 16    Physical Exam  Physical Exam  Constitutional: No distress.  Eyes: EOM are normal.  Pupils are equal, round, and reactive to light.  Cardiovascular: Normal rate.   Pulmonary/Chest: No respiratory distress. He has no wheezes. He has no rales.  Musculoskeletal:  Left foot tenderness with palpation on left heel, 2+ dorsalis pedis pulse, no erythema or swelling.    CBC    Component Value Date/Time   WBC 5.3 09/05/2013 0920   RBC 5.16 09/05/2013 0920   HGB 17.0 12/12/2013 1954   HCT 50.0 12/12/2013 1954   PLT 251 09/05/2013 0920   MCV 87.6 09/05/2013 0920   LYMPHSABS 2.5 09/05/2013 0920   MONOABS 0.5 09/05/2013 0920   EOSABS 0.1 09/05/2013 0920   BASOSABS 0.0 09/05/2013 0920    CMP     Component Value Date/Time   NA 135* 12/12/2013 1954   K 3.9 12/12/2013 1954   CL 101 12/12/2013 1954   CO2 24 09/05/2013 0921   GLUCOSE 98 12/12/2013 1954   BUN 16 12/12/2013 1954   CREATININE 0.80 12/12/2013 1954   CREATININE 0.84 09/05/2013 0921   CALCIUM 9.1 09/05/2013 0921   PROT 7.5 09/05/2013 0921   ALBUMIN 4.7 09/05/2013 0921   AST 16 09/05/2013 0921   ALT 17 09/05/2013 0921   ALKPHOS 41 09/05/2013 0921     BILITOT 0.8 09/05/2013 0921   GFRNONAA >89 09/05/2013 0921   GFRNONAA 89* 05/09/2012 1347   GFRAA >89 09/05/2013 0921   GFRAA >90 05/09/2012 1347    Lab Results  Component Value Date/Time   CHOL 238* 09/05/2013 09:21 AM    No results found for: HGBA1C  Lab Results  Component Value Date/Time   AST 16 09/05/2013 09:21 AM    Assessment and Plan  Calcaneal spur of foot, left/Heel pain, left I prescribed patient Neurontin, he will apply for orange card and will be able to see the podiatrist  Essential hypertension I have started patient on atenolol 12.5 mg daily, will come back in 2 weeks time for nurses visit BP check, advised patient for DASH diet.  Smoking Advise patient to quit smoking   Return in about 3 months (around 12/05/2014), or if symptoms worsen or fail to improve, for BP check in 2 weeks/Nurse Visit.   This note has been created with Education officer, environmentalDragon speech recognition software and smart phrase technology. Any transcriptional errors are unintentional.    Doris CheadleADVANI, Larna Capelle, MD

## 2014-09-04 NOTE — Progress Notes (Signed)
Pt is here to discuss his foot pain. He also stated he needs to change his amlodipine this has made him start itching.

## 2014-09-04 NOTE — Patient Instructions (Signed)
Plantar Fasciitis  Plantar fasciitis is a common condition that causes foot pain. It is soreness (inflammation) of the band of tough fibrous tissue on the bottom of the foot that runs from the heel bone (calcaneus) to the ball of the foot. The cause of this soreness may be from excessive standing, poor fitting shoes, running on hard surfaces, being overweight, having an abnormal walk, or overuse (this is common in runners) of the painful foot or feet. It is also common in aerobic exercise dancers and ballet dancers.  SYMPTOMS   Most people with plantar fasciitis complain of:   Severe pain in the morning on the bottom of their foot especially when taking the first steps out of bed. This pain recedes after a few minutes of walking.   Severe pain is experienced also during walking following a long period of inactivity.   Pain is worse when walking barefoot or up stairs  DIAGNOSIS    Your caregiver will diagnose this condition by examining and feeling your foot.   Special tests such as X-rays of your foot, are usually not needed.  PREVENTION    Consult a sports medicine professional before beginning a new exercise program.   Walking programs offer a good workout. With walking there is a lower chance of overuse injuries common to runners. There is less impact and less jarring of the joints.   Begin all new exercise programs slowly. If problems or pain develop, decrease the amount of time or distance until you are at a comfortable level.   Wear good shoes and replace them regularly.   Stretch your foot and the heel cords at the back of the ankle (Achilles tendon) both before and after exercise.   Run or exercise on even surfaces that are not hard. For example, asphalt is better than pavement.   Do not run barefoot on hard surfaces.   If using a treadmill, vary the incline.   Do not continue to workout if you have foot or joint problems. Seek professional help if they do not improve.  HOME CARE INSTRUCTIONS     Avoid activities that cause you pain until you recover.   Use ice or cold packs on the problem or painful areas after working out.   Only take over-the-counter or prescription medicines for pain, discomfort, or fever as directed by your caregiver.   Soft shoe inserts or athletic shoes with air or gel sole cushions may be helpful.   If problems continue or become more severe, consult a sports medicine caregiver or your own health care provider. Cortisone is a potent anti-inflammatory medication that may be injected into the painful area. You can discuss this treatment with your caregiver.  MAKE SURE YOU:    Understand these instructions.   Will watch your condition.   Will get help right away if you are not doing well or get worse.  Document Released: 11/05/2000 Document Revised: 05/05/2011 Document Reviewed: 01/05/2008  [employer] Patient Information 2015 [employer]. This information is not intended to replace advice given to you by your health care provider. Make sure you discuss any questions you have with your health care provider.

## 2014-10-31 ENCOUNTER — Encounter (HOSPITAL_COMMUNITY): Payer: Self-pay | Admitting: Emergency Medicine

## 2014-10-31 ENCOUNTER — Emergency Department (HOSPITAL_COMMUNITY)
Admission: EM | Admit: 2014-10-31 | Discharge: 2014-10-31 | Disposition: A | Discharge status: Home / Self Care | Payer: Self-pay | Attending: Emergency Medicine | Admitting: Emergency Medicine

## 2014-10-31 DIAGNOSIS — Z79899 Other long term (current) drug therapy: Secondary | ICD-10-CM | POA: Insufficient documentation

## 2014-10-31 DIAGNOSIS — X58XXXA Exposure to other specified factors, initial encounter: Secondary | ICD-10-CM | POA: Insufficient documentation

## 2014-10-31 DIAGNOSIS — Z720 Tobacco use: Secondary | ICD-10-CM | POA: Insufficient documentation

## 2014-10-31 DIAGNOSIS — Y998 Other external cause status: Secondary | ICD-10-CM | POA: Insufficient documentation

## 2014-10-31 DIAGNOSIS — S3992XA Unspecified injury of lower back, initial encounter: Secondary | ICD-10-CM

## 2014-10-31 DIAGNOSIS — Y9289 Other specified places as the place of occurrence of the external cause: Secondary | ICD-10-CM | POA: Insufficient documentation

## 2014-10-31 DIAGNOSIS — Y9389 Activity, other specified: Secondary | ICD-10-CM | POA: Insufficient documentation

## 2014-10-31 MED ORDER — HYDROCODONE-ACETAMINOPHEN 5-325 MG PO TABS: 1.0000 | ORAL_TABLET | Freq: Four times a day (QID) | ORAL | Status: DC | PRN

## 2014-10-31 MED ORDER — HYDROCODONE-ACETAMINOPHEN 5-325 MG PO TABS
1.0000 | ORAL_TABLET | Freq: Once | ORAL | Status: AC
  Administered 2014-10-31: 1 via ORAL
  Filled 2014-10-31: qty 1

## 2014-10-31 MED ORDER — CYCLOBENZAPRINE HCL 10 MG PO TABS: 10.0000 mg | ORAL_TABLET | Freq: Two times a day (BID) | ORAL | Status: DC | PRN

## 2014-10-31 MED ORDER — ONDANSETRON 4 MG PO TBDP
4.0000 mg | ORAL_TABLET | Freq: Once | ORAL | Status: AC
  Administered 2014-10-31: 4 mg via ORAL
  Filled 2014-10-31: qty 1

## 2014-10-31 MED ORDER — NAPROXEN 375 MG PO TABS: 375.0000 mg | ORAL_TABLET | Freq: Two times a day (BID) | ORAL | Status: DC

## 2014-10-31 NOTE — ED Provider Notes (Signed)
CSN: [id number]     Arrival date & time 10/31/14  1554 History  This chart was scribed for non-physician practitioner, Arthor Captain, PA-C, working with Benjiman Core, MD by Freida Busman, ED Scribe. This patient was seen in room WTR9/WTR9 and the patient's care was started at 5:30 PM.    Chief Complaint  Patient presents with  . pulled muscles    The history is provided by the patient. No language interpreter was used.    HPI Comments:  Kevin Maxwell is a 64 y.o. male who presents to the Emergency Department complaining of throbbing lower back pain for 2 days.  Pt notes he was working outside pulling up rebar from the ground prior to symptom onset. Pt feels as though he pulled a muscle. He denies weakness, numbness and tingling in his extremities, and bowel/bladder incontinence. No alleviating factors noted.  Past Medical History  Diagnosis Date  . Shoulder problem     left shoulder   Past Surgical History  Procedure Laterality Date  . Hernia repair     No family history on file. Social History  Substance Use Topics  . Smoking status: Current Every Day Smoker -- 0.50 packs/day    Types: Cigarettes  . Smokeless tobacco: Never Used  . Alcohol Use: Yes     Comment: quart to 1.5 after work each day of beer    Review of Systems  Constitutional: Negative for fever and chills.  Musculoskeletal: Positive for myalgias and back pain.  Neurological: Negative for weakness and numbness.     Allergies  Aspirin; Ibuprofen; and Norvasc  Home Medications   Prior to Admission medications   Medication Sig Start Date End Date Taking? Authorizing Provider  atenolol (TENORMIN) 25 MG tablet Take 0.5 tablets (12.5 mg total) by mouth daily. 09/04/14   Doris Cheadle, MD  cyclobenzaprine (FLEXERIL) 10 MG tablet Take 1 tablet (10 mg total) by mouth 2 (two) times daily as needed for muscle spasms. 10/31/14   Arthor Captain, PA-C  gabapentin (NEURONTIN) 300 MG capsule Take 1 capsule (300 mg total) by  mouth 3 (three) times daily. 09/04/14   Doris Cheadle, MD  HYDROcodone-acetaminophen (NORCO) 5-325 MG per tablet Take 1-2 tablets by mouth every 6 (six) hours as needed for moderate pain. 10/31/14   Arthor Captain, PA-C  methocarbamol (ROBAXIN) 500 MG tablet Take 2 tablets (1,000 mg total) by mouth 4 (four) times daily as needed (Pain). 02/20/14   Nicole Pisciotta, PA-C  predniSONE (STERAPRED UNI-PAK) 10 MG tablet Take by mouth daily. Day 1: take 6 tabs.  Day 2: 5 tabs  Day 3: 4 tabs  Day 4: 3 tabs  Day 5: 2 tabs  Day 6: 1 tab Patient not taking: Reported on 09/04/2014 01/09/14   Trixie Dredge, PA-C  traMADol (ULTRAM) 50 MG tablet Take 1 tablet (50 mg total) by mouth every 12 (twelve) hours as needed. 06/15/14   Deepak Advani, MD   BP 140/91 mmHg  Pulse 67  Temp(Src) 98.4 F (36.9 C) (Oral)  Resp 18  SpO2 100% Physical Exam  Constitutional: He is oriented to person, place, and time. He appears well-developed and well-nourished.  HENT:  Head: Normocephalic and atraumatic.  Cardiovascular: Normal rate.   Pulmonary/Chest: Effort normal.  Abdominal: He exhibits no distension.  Musculoskeletal:  Tenderness over right SI joint  Neurological: He is alert and oriented to person, place, and time.  Skin: Skin is warm and dry.  Psychiatric: He has a normal mood and affect.  Nursing note and  vitals reviewed.   ED Course  Procedures   DIAGNOSTIC STUDIES:  Oxygen Saturation is 100% on RA, normal by my interpretation.    COORDINATION OF CARE:  5:33 PM Discussed treatment plan with pt at bedside and pt agreed to plan.  Labs Review Labs Reviewed - No data to display  Imaging Review No results found. I have personally reviewed and evaluated these images and lab results as part of my medical decision-making.   EKG Interpretation None      MDM   Final diagnoses:  Lower back injury, initial encounter    Patient with back pain.  No neurological deficits and normal neuro exam.  Patient can  walk but states is painful.  No loss of bowel or bladder control.  No concern for cauda equina.  No fever, night sweats, weight loss, h/o cancer, IVDU.  RICE protocol and pain medicine indicated and discussed with patient. ' I personally performed the services described in this documentation, which was scribed in my presence. The recorded information has been reviewed and is accurate.       Arthor Captain, PA-C 11/03/14 1903  Benjiman Core, MD 11/08/14 [phone]

## 2014-10-31 NOTE — Discharge Instructions (Signed)
Ask for lidocaine 4% patches  SEEK IMMEDIATE MEDICAL ATTENTION IF: New numbness, tingling, weakness, or problem with the use of your arms or legs.  Severe back pain not relieved with medications.  Change in bowel or bladder control.  Increasing pain in any areas of the body (such as chest or abdominal pain).  Shortness of breath, dizziness or fainting.  Nausea (feeling sick to your stomach), vomiting, fever, or sweats.  Back Injury Prevention Back injuries can be extremely painful and difficult to heal. After having one back injury, you are much more likely to experience another later on. It is important to learn how to avoid injuring or re-injuring your back. The following tips can help you to prevent a back injury. PHYSICAL FITNESS  Exercise regularly and try to develop good tone in your abdominal muscles. Your abdominal muscles provide a lot of the support needed by your back.  Do aerobic exercises (walking, jogging, biking, swimming) regularly.  Do exercises that increase balance and strength (tai chi, yoga) regularly. This can decrease your risk of falling and injuring your back.  Stretch before and after exercising.  Maintain a healthy weight. The more you weigh, the more stress is placed on your back. For every pound of weight, 10 times that amount of pressure is placed on the back. DIET  Talk to your caregiver about how much calcium and vitamin D you need per day. These nutrients help to prevent weakening of the bones (osteoporosis). Osteoporosis can cause broken (fractured) bones that lead to back pain.  Include good sources of calcium in your diet, such as dairy products, green, leafy vegetables, and products with calcium added (fortified).  Include good sources of vitamin D in your diet, such as milk and foods that are fortified with vitamin D.  Consider taking a nutritional supplement or a multivitamin if needed.  Stop smoking if you smoke. POSTURE  Sit and stand up  straight. Avoid leaning forward when you sit or hunching over when you stand.  Choose chairs with good low back (lumbar) support.  If you work at a desk, sit close to your work so you do not need to lean over. Keep your chin tucked in. Keep your neck drawn back and elbows bent at a right angle. Your arms should look like the letter "L."  Sit high and close to the steering wheel when you drive. Add a lumbar support to your car seat if needed.  Avoid sitting or standing in one position for too long. Take breaks to get up, stretch, and walk around at least once every hour. Take breaks if you are driving for long periods of time.  Sleep on your side with your knees slightly bent, or sleep on your back with a pillow under your knees. Do not sleep on your stomach. LIFTING, TWISTING, AND REACHING  Avoid heavy lifting, especially repetitive lifting. If you must do heavy lifting:  Stretch before lifting.  Work slowly.  Rest between lifts.  Use carts and dollies to move objects when possible.  Make several small trips instead of carrying 1 heavy load.  Ask for help when you need it.  Ask for help when moving big, awkward objects.  Follow these steps when lifting:  Stand with your feet shoulder-width apart.  Get as close to the object as you can. Do not try to pick up heavy objects that are far from your body.  Use handles or lifting straps if they are available.  Bend at your knees.  Squat down, but keep your heels off the floor.  Keep your shoulders pulled back, your chin tucked in, and your back straight.  Lift the object slowly, tightening the muscles in your legs, abdomen, and buttocks. Keep the object as close to the center of your body as possible.  When you put a load down, use these same guidelines in reverse.  Do not:  Lift the object above your waist.  Twist at the waist while lifting or carrying a load. Move your feet if you need to turn, not your waist.  Bend over  without bending at your knees.  Avoid reaching over your head, across a table, or for an object on a high surface. OTHER TIPS  Avoid wet floors and keep sidewalks clear of ice to prevent falls.  Do not sleep on a mattress that is too soft or too hard.  Keep items that are used frequently within easy reach.  Put heavier objects on shelves at waist level and lighter objects on lower or higher shelves.  Find ways to decrease your stress, such as exercise, massage, or relaxation techniques. Stress can build up in your muscles. Tense muscles are more vulnerable to injury.  Seek treatment for depression or anxiety if needed. These conditions can increase your risk of developing back pain. SEEK MEDICAL CARE IF:  You injure your back.  You have questions about diet, exercise, or other ways to prevent back injuries. MAKE SURE YOU:  Understand these instructions.  Will watch your condition.  Will get help right away if you are not doing well or get worse. Document Released: 03/20/2004 Document Revised: 05/05/2011 Document Reviewed: 03/24/2011 [facility] Patient Information 2015 [place], [place]. This information is not intended to replace advice given to you by your health care provider. Make sure you discuss any questions you have with your health care provider.   Back Exercises Back exercises help treat and prevent back injuries. The goal of back exercises is to increase the strength of your abdominal and back muscles and the flexibility of your back. These exercises should be started when you no longer have back pain. Back exercises include:  Pelvic Tilt. Lie on your back with your knees bent. Tilt your pelvis until the lower part of your back is against the floor. Hold this position 5 to 10 sec and repeat 5 to 10 times.  Knee to Chest. Pull first 1 knee up against your chest and hold for 20 to 30 seconds, repeat this with the other knee, and then both knees. This may be done with the  other leg straight or bent, whichever feels better.  Sit-Ups or Curl-Ups. Bend your knees 90 degrees. Start with tilting your pelvis, and do a partial, slow sit-up, lifting your trunk only 30 to 45 degrees off the floor. Take at least 2 to 3 seconds for each sit-up. Do not do sit-ups with your knees out straight. If partial sit-ups are difficult, simply do the above but with only tightening your abdominal muscles and holding it as directed.  Hip-Lift. Lie on your back with your knees flexed 90 degrees. Push down with your feet and shoulders as you raise your hips a couple inches off the floor; hold for 10 seconds, repeat 5 to 10 times.  Back arches. Lie on your stomach, propping yourself up on bent elbows. Slowly press on your hands, causing an arch in your low back. Repeat 3 to 5 times. Any initial stiffness and discomfort should lessen with repetition over time.  Shoulder-Lifts. Lie face down with arms beside your body. Keep hips and torso pressed to floor as you slowly lift your head and shoulders off the floor. Do not overdo your exercises, especially in the beginning. Exercises may cause you some mild back discomfort which lasts for a few minutes; however, if the pain is more severe, or lasts for more than 15 minutes, do not continue exercises until you see your caregiver. Improvement with exercise therapy for back problems is slow.  See your caregivers for assistance with developing a proper back exercise program. Document Released: 03/20/2004 Document Revised: 05/05/2011 Document Reviewed: 12/12/2010 [facility] Patient Information 2015 [place], [place]. This information is not intended to replace advice given to you by your health care provider. Make sure you discuss any questions you have with your health care provider. Back Pain, Adult Low back pain is very common. About 1 in 5 people have back pain.The cause of low back pain is rarely dangerous. The pain often gets better over time.About half  of people with a sudden onset of back pain feel better in just 2 weeks. About 8 in 10 people feel better by 6 weeks.  CAUSES Some common causes of back pain include:  Strain of the muscles or ligaments supporting the spine.  Wear and tear (degeneration) of the spinal discs.  Arthritis.  Direct injury to the back. DIAGNOSIS Most of the time, the direct cause of low back pain is not known.However, back pain can be treated effectively even when the exact cause of the pain is unknown.Answering your caregiver's questions about your overall health and symptoms is one of the most accurate ways to make sure the cause of your pain is not dangerous. If your caregiver needs more information, he or she may order lab work or imaging tests (X-rays or MRIs).However, even if imaging tests show changes in your back, this usually does not require surgery. HOME CARE INSTRUCTIONS For many people, back pain returns.Since low back pain is rarely dangerous, it is often a condition that people can learn to [facility] their own.   Remain active. It is stressful on the back to sit or stand in one place. Do not sit, drive, or stand in one place for more than 30 minutes at a time. Take short walks on level surfaces as soon as pain allows.Try to increase the length of time you walk each day.  Do not stay in bed.Resting more than 1 or 2 days can delay your recovery.  Do not avoid exercise or work.Your body is made to move.It is not dangerous to be active, even though your back may hurt.Your back will likely heal faster if you return to being active before your pain is gone.  Pay attention to your body when you bend and lift. Many people have less discomfortwhen lifting if they bend their knees, keep the load close to their bodies,and avoid twisting. Often, the most comfortable positions are those that put less stress on your recovering back.  Find a comfortable position to sleep. Use a firm mattress and lie on  your side with your knees slightly bent. If you lie on your back, put a pillow under your knees.  Only take over-the-counter or prescription medicines as directed by your caregiver. Over-the-counter medicines to reduce pain and inflammation are often the most helpful.Your caregiver may prescribe muscle relaxant drugs.These medicines help dull your pain so you can more quickly return to your normal activities and healthy exercise.  Put ice on the injured area.  Put ice in a plastic bag.  Place a towel between your skin and the bag.  Leave the ice on for 15-20 minutes, 03-04 times a day for the first 2 to 3 days. After that, ice and heat may be alternated to reduce pain and spasms.  Ask your caregiver about trying back exercises and gentle massage. This may be of some benefit.  Avoid feeling anxious or stressed.Stress increases muscle tension and can worsen back pain.It is important to recognize when you are anxious or stressed and learn ways to manage it.Exercise is a great option. SEEK MEDICAL CARE IF:  You have pain that is not relieved with rest or medicine.  You have pain that does not improve in 1 week.  You have new symptoms.  You are generally not feeling well. SEEK IMMEDIATE MEDICAL CARE IF:   You have pain that radiates from your back into your legs.  You develop new bowel or bladder control problems.  You have unusual weakness or numbness in your arms or legs.  You develop nausea or vomiting.  You develop abdominal pain.  You feel faint. Document Released: 02/10/2005 Document Revised: 08/12/2011 Document Reviewed: 06/14/2013 [facility] Patient Information 2015 [place], [place]. This information is not intended to replace advice given to you by your health care provider. Make sure you discuss any questions you have with your health care provider.

## 2014-10-31 NOTE — ED Notes (Signed)
Per patient, states he was building play set and injured right lower part of his back

## 2015-04-14 ENCOUNTER — Emergency Department (HOSPITAL_COMMUNITY): Payer: Self-pay

## 2015-04-14 ENCOUNTER — Emergency Department (HOSPITAL_COMMUNITY)
Admission: EM | Admit: 2015-04-14 | Discharge: 2015-04-14 | Disposition: A | Discharge status: Home / Self Care | Payer: Self-pay | Attending: Emergency Medicine | Admitting: Emergency Medicine

## 2015-04-14 ENCOUNTER — Encounter (HOSPITAL_COMMUNITY): Payer: Self-pay

## 2015-04-14 DIAGNOSIS — S4992XA Unspecified injury of left shoulder and upper arm, initial encounter: Secondary | ICD-10-CM | POA: Insufficient documentation

## 2015-04-14 DIAGNOSIS — Y990 Civilian activity done for income or pay: Secondary | ICD-10-CM | POA: Insufficient documentation

## 2015-04-14 DIAGNOSIS — Y9289 Other specified places as the place of occurrence of the external cause: Secondary | ICD-10-CM | POA: Insufficient documentation

## 2015-04-14 DIAGNOSIS — S3992XA Unspecified injury of lower back, initial encounter: Secondary | ICD-10-CM | POA: Insufficient documentation

## 2015-04-14 DIAGNOSIS — Y9389 Activity, other specified: Secondary | ICD-10-CM | POA: Insufficient documentation

## 2015-04-14 DIAGNOSIS — F17210 Nicotine dependence, cigarettes, uncomplicated: Secondary | ICD-10-CM | POA: Insufficient documentation

## 2015-04-14 DIAGNOSIS — Z79899 Other long term (current) drug therapy: Secondary | ICD-10-CM | POA: Insufficient documentation

## 2015-04-14 DIAGNOSIS — S63501A Unspecified sprain of right wrist, initial encounter: Secondary | ICD-10-CM

## 2015-04-14 DIAGNOSIS — M25512 Pain in left shoulder: Secondary | ICD-10-CM

## 2015-04-14 DIAGNOSIS — X501XXA Overexertion from prolonged static or awkward postures, initial encounter: Secondary | ICD-10-CM | POA: Insufficient documentation

## 2015-04-14 IMAGING — CR DG WRIST COMPLETE 3+V*R*
4 series · 4 of 4 positions shown · non-contrast
Comparison: None.

CLINICAL DATA: Acute right wrist pain, lifting injury 3 days ago.

EXAM:
RIGHT WRIST - COMPLETE 3+ VIEW

[x wrist pa right]
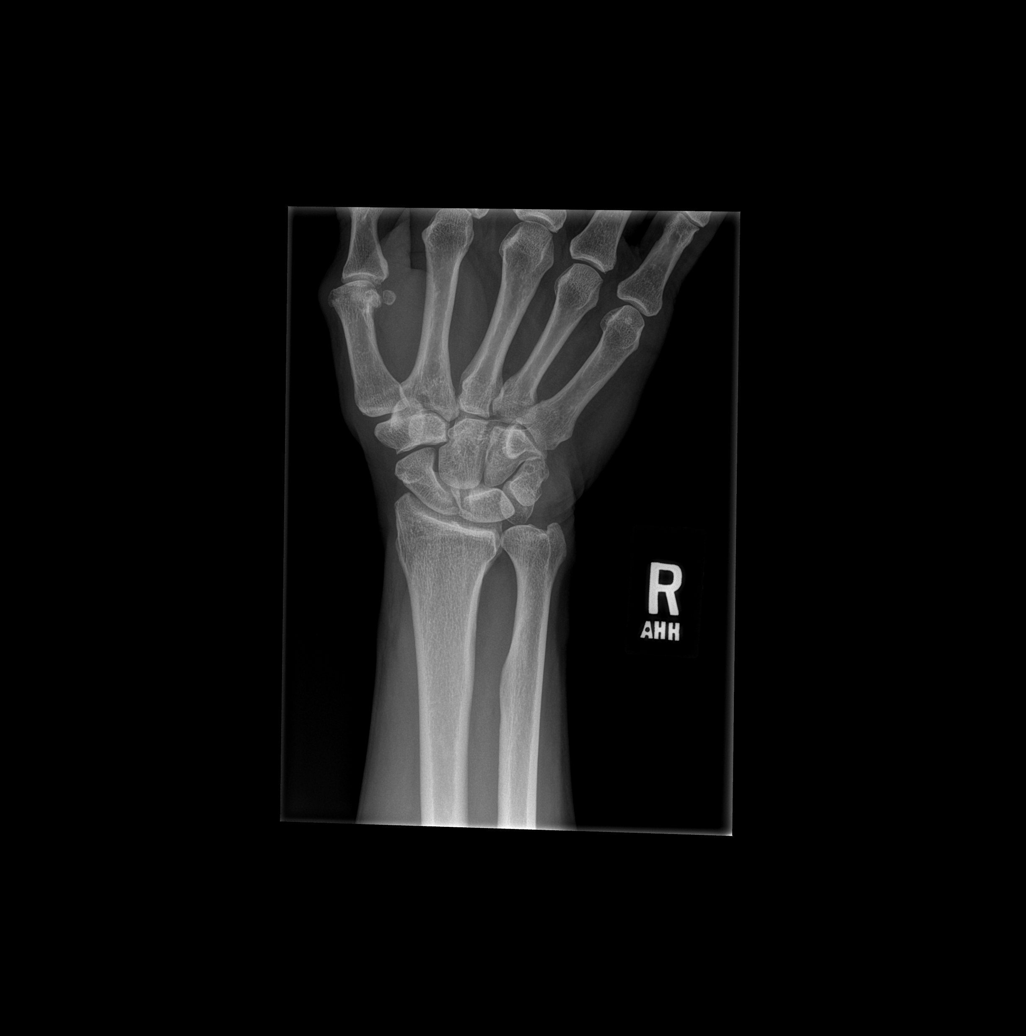

[x wrist obl right]
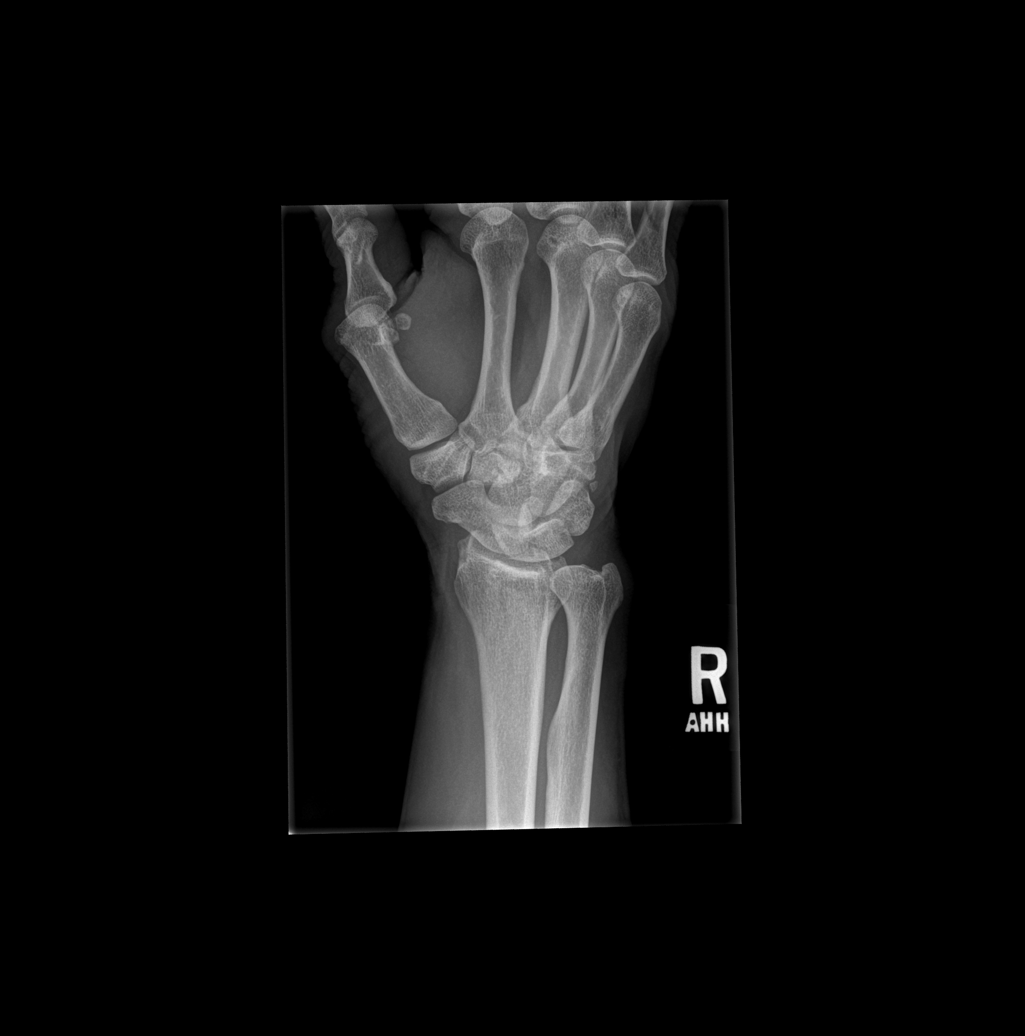

[x wrist lat right]
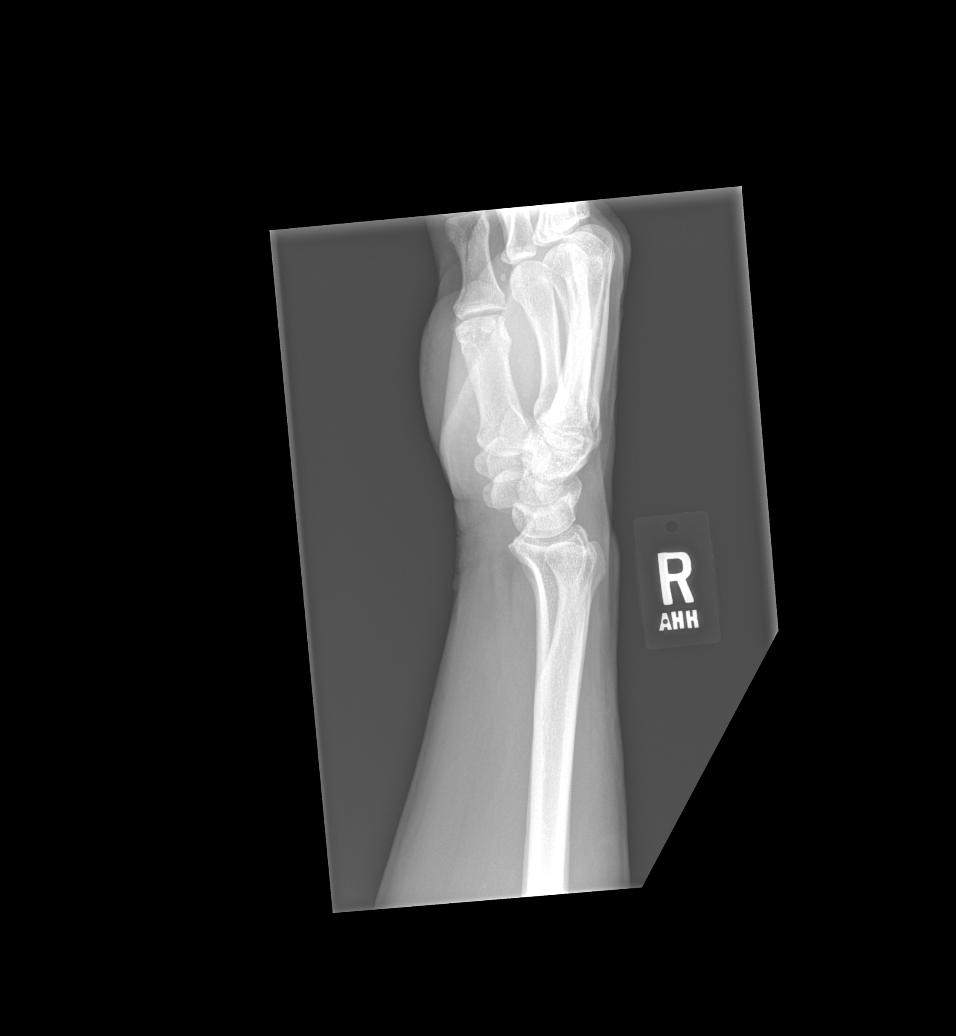

[x wrist navicular view right]
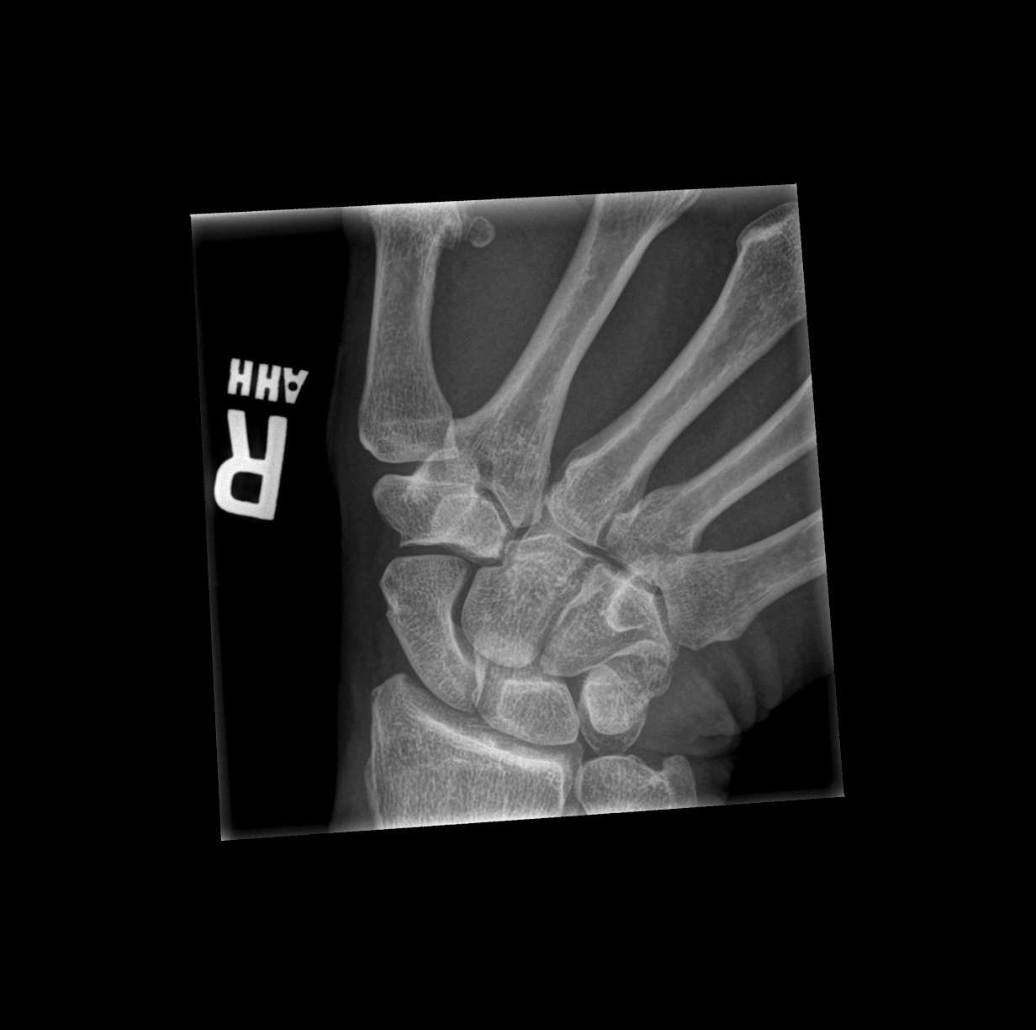

[4 of 4 positions shown; findings below may reference images not displayed]

FINDINGS: Normal alignment without acute fracture, subluxation, or
dislocation. Right distal radius, ulna and carpal bones appear
intact. Degenerative osteoarthritis of the right first [facility] joint
with joint space loss and mild bony spurring.
IMPRESSION: No acute osseous finding.

## 2015-04-14 IMAGING — CR DG SHOULDER 2+V*L*
3 series · 3 of 3 positions shown · non-contrast
Comparison: [DATE] left shoulder radiograph

CLINICAL DATA: Left shoulder pain after lifting.

EXAM:
LEFT SHOULDER - 2+ VIEW

[w shoulder external left]
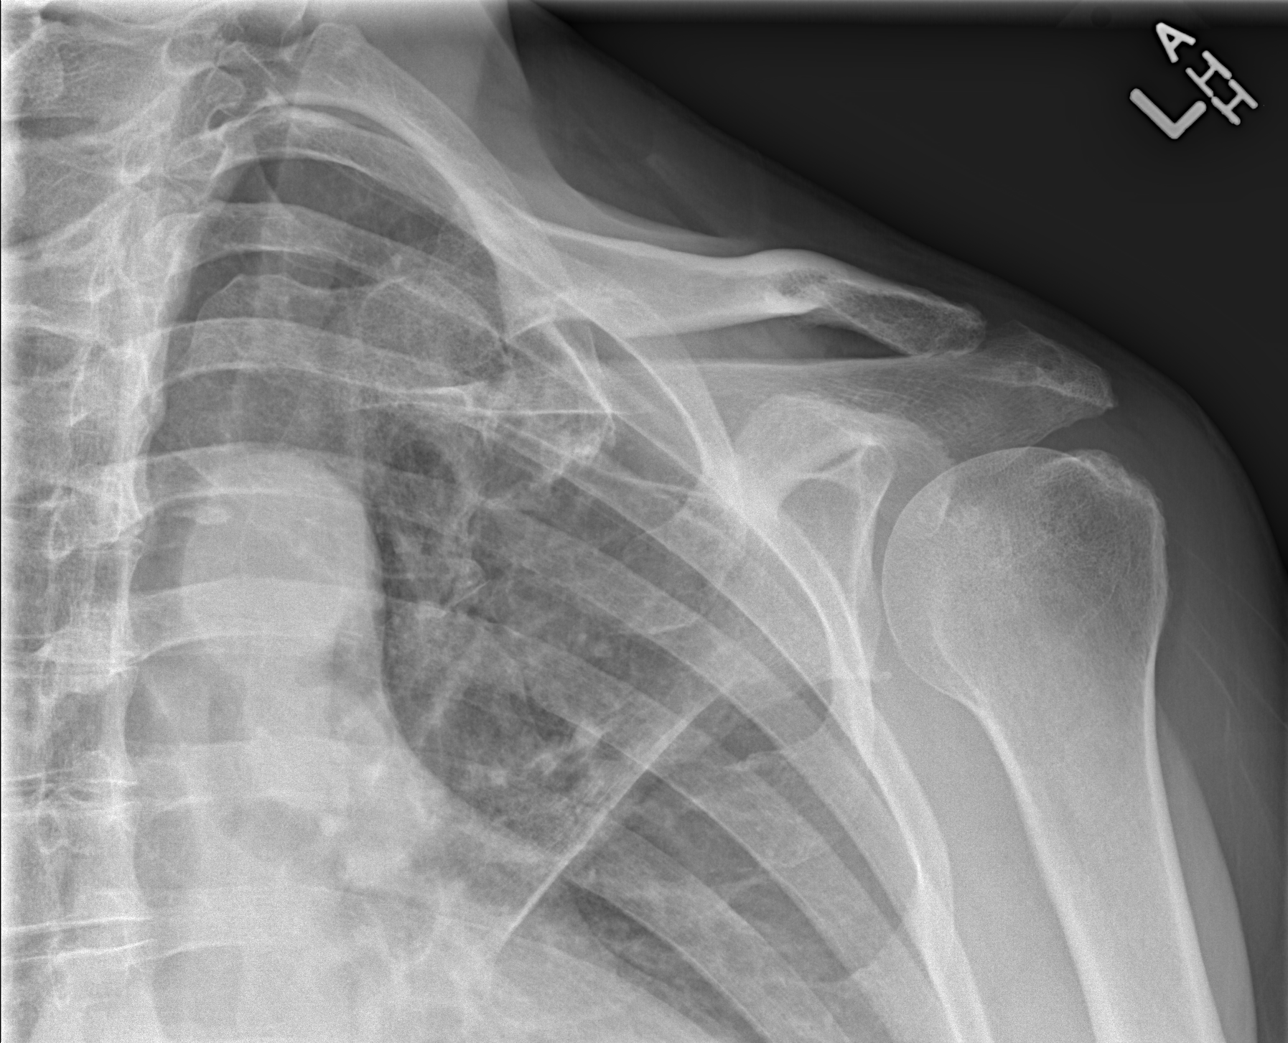

[w shoulder y-view left]
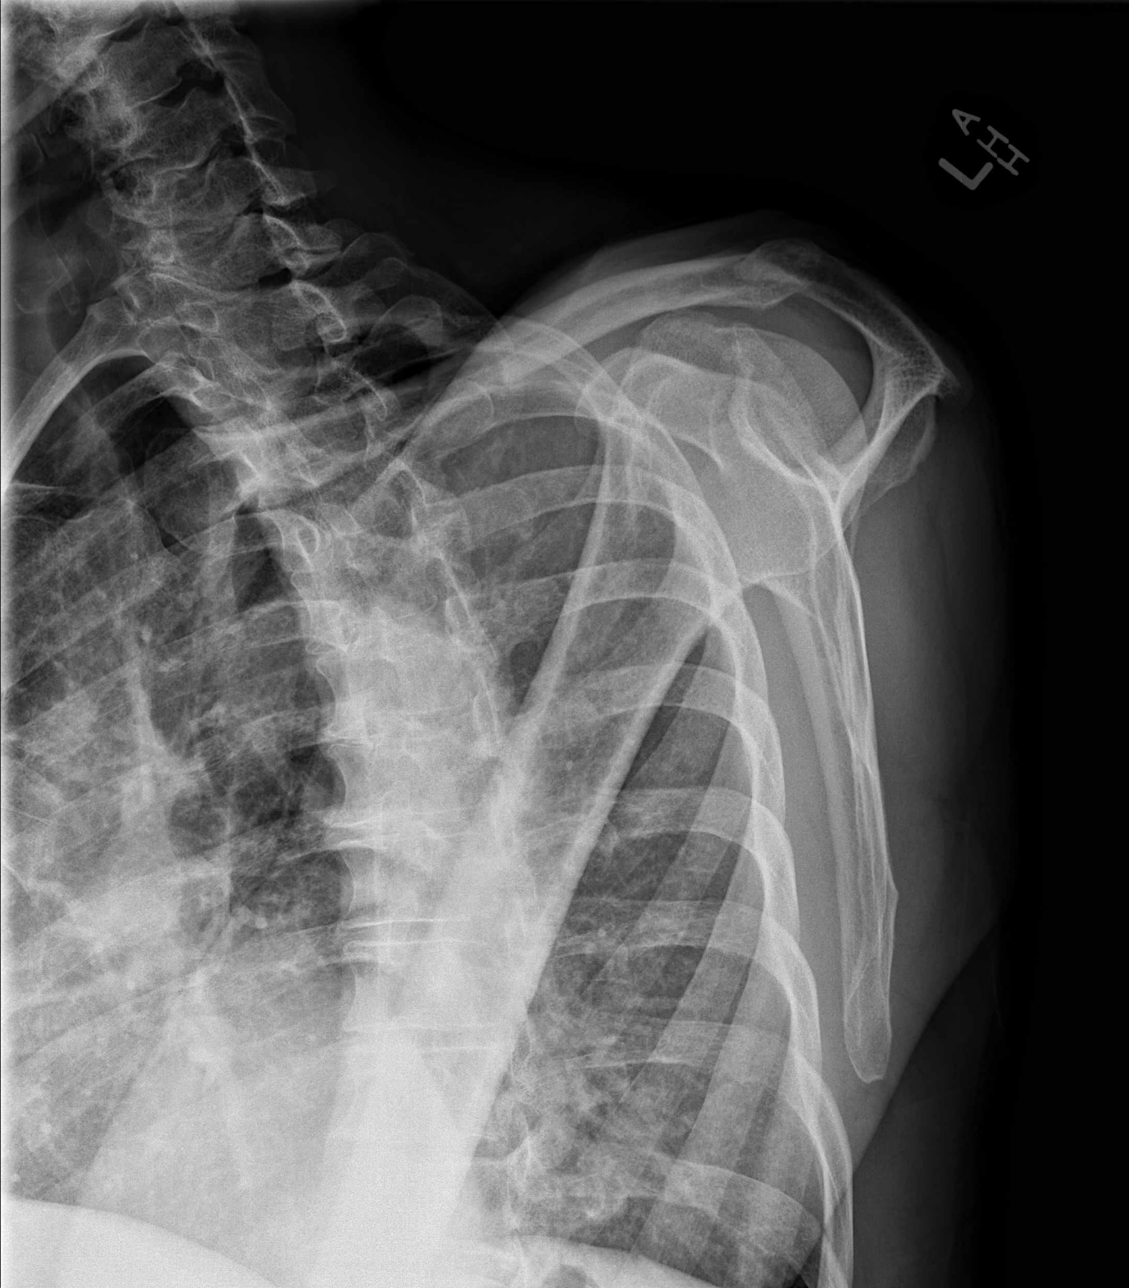

[x shoulder axillary left]
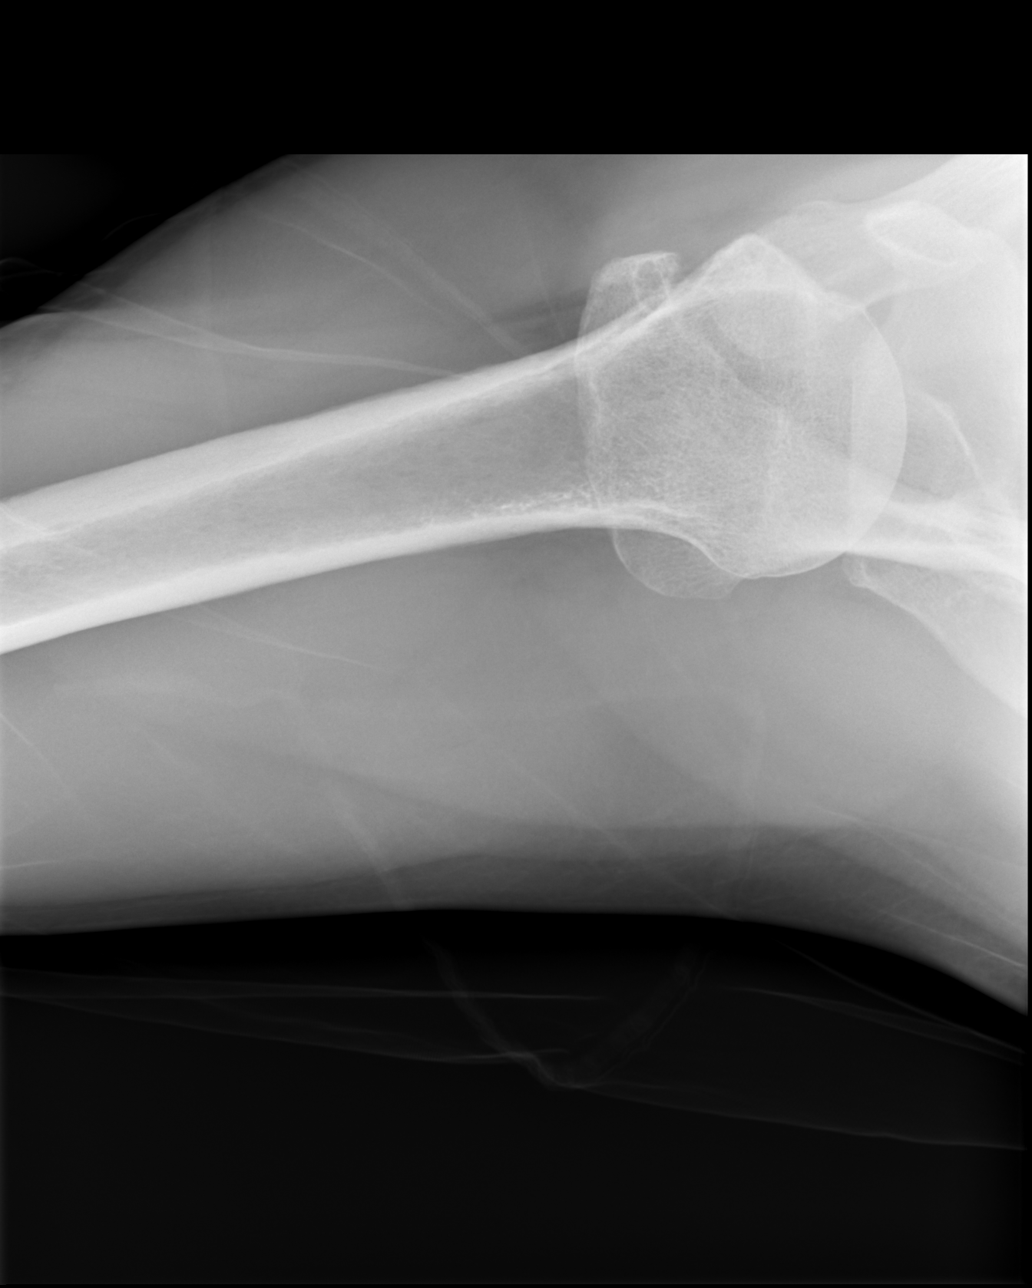

[3 of 3 positions shown; findings below may reference images not displayed]

FINDINGS: Stable subchondral cyst at the acromion side of the left
acromioclavicular joint. New tiny erosions at the clavicular side of
the left acromioclavicular joint. No fracture, dislocation,
Hill-Sachs deformity or suspicious focal osseous lesion. Tiny
osteophyte at the left inferior glenoid.
IMPRESSION: 1. No fracture or dislocation.
2. New tiny erosions at the clavicular side of the left
acromioclavicular joint, which are nonspecific with a differential
of repetitive trauma from lifting, hypoparathyroidism or rheumatoid
arthritis.
3. Minimal osteoarthritis at the inferior left glenohumeral joint.

## 2015-04-14 MED ORDER — HYDROCODONE-ACETAMINOPHEN 5-325 MG PO TABS: 1.0000 | ORAL_TABLET | Freq: Four times a day (QID) | ORAL | Status: DC | PRN

## 2015-04-14 NOTE — Discharge Instructions (Signed)
Take pain medications as prescribe. Wear a splint on right wrist. Avoid any lifting greater than 5lb with either arm. Follow up as instructed.   Wrist Pain There are many things that can cause wrist pain. Some common causes include:  An injury to the wrist area, such as a sprain, strain, or fracture.  Overuse of the joint.  A condition that causes increased pressure on a nerve in the wrist (carpal tunnel syndrome).  Wear and tear of the joints that occurs with aging (osteoarthritis).  A variety of other types of arthritis. Sometimes, the cause of wrist pain is not known. The pain often goes away when you follow your health care provider's instructions for relieving pain at home. If your wrist pain continues, tests may need to be done to diagnose your condition. HOME CARE INSTRUCTIONS Pay attention to any changes in your symptoms. Take these actions to help with your pain:  Rest the wrist area for at least 48 hours or as told by your health care provider.  If directed, apply ice to the injured area:  Put ice in a plastic bag.  Place a towel between your skin and the bag.  Leave the ice on for 20 minutes, 2-3 times per day.  Keep your arm raised (elevated) above the level of your heart while you are sitting or lying down.  If a splint or elastic bandage has been applied, use it as told by your health care provider.  Remove the splint or bandage only as told by your health care provider.  Loosen the splint or bandage if your fingers become numb or have a tingling feeling, or if they turn cold or blue.  Take over-the-counter and prescription medicines only as told by your health care provider.  Keep all follow-up visits as told by your health care provider. This is important. SEEK MEDICAL CARE IF:  Your pain is not helped by treatment.  Your pain gets worse. SEEK IMMEDIATE MEDICAL CARE IF:  Your fingers become swollen.  Your fingers turn white, very red, or cold and  blue.  Your fingers are numb or have a tingling feeling.  You have difficulty moving your fingers.   This information is not intended to replace advice given to you by your health care provider. Make sure you discuss any questions you have with your health care provider.   Document Released: 11/20/2004 Document Revised: 11/01/2014 Document Reviewed: 06/28/2014 Elsevier Interactive Patient Education 2016 [employer]   Shoulder Pain The shoulder is the joint that connects your arms to your body. The bones that form the shoulder joint include the upper arm bone (humerus), the shoulder blade (scapula), and the collarbone (clavicle). The top of the humerus is shaped like a ball and fits into a rather flat socket on the scapula (glenoid cavity). A combination of muscles and strong, fibrous tissues that connect muscles to bones (tendons) support your shoulder joint and hold the ball in the socket. Small, fluid-filled sacs (bursae) are located in different areas of the joint. They act as cushions between the bones and the overlying soft tissues and help reduce friction between the gliding tendons and the bone as you move your arm. Your shoulder joint allows a wide range of motion in your arm. This range of motion allows you to do things like scratch your back or throw a ball. However, this range of motion also makes your shoulder more prone to pain from overuse and injury. Causes of shoulder pain can originate from both  injury and overuse and usually can be grouped in the following four categories:  Redness, swelling, and pain (inflammation) of the tendon (tendinitis) or the bursae (bursitis).  Instability, such as a dislocation of the joint.  Inflammation of the joint (arthritis).  Broken bone (fracture). HOME CARE INSTRUCTIONS   Apply ice to the sore area.  Put ice in a plastic bag.  Place a towel between your skin and the bag.  Leave the ice on for 15-20 minutes, 3-4 times per day for  the first 2 days, or as directed by your health care provider.  Stop using cold packs if they do not help with the pain.  If you have a shoulder sling or immobilizer, wear it as long as your caregiver instructs. Only remove it to shower or bathe. Move your arm as little as possible, but keep your hand moving to prevent swelling.  Squeeze a soft ball or foam pad as much as possible to help prevent swelling.  Only take over-the-counter or prescription medicines for pain, discomfort, or fever as directed by your caregiver. SEEK MEDICAL CARE IF:   Your shoulder pain increases, or new pain develops in your arm, hand, or fingers.  Your hand or fingers become cold and numb.  Your pain is not relieved with medicines. SEEK IMMEDIATE MEDICAL CARE IF:   Your arm, hand, or fingers are numb or tingling.  Your arm, hand, or fingers are significantly swollen or turn white or blue. MAKE SURE YOU:   Understand these instructions.  Will watch your condition.  Will get help right away if you are not doing well or get worse.   This information is not intended to replace advice given to you by your health care provider. Make sure you discuss any questions you have with your health care provider.   Document Released: 11/20/2004 Document Revised: 03/03/2014 Document Reviewed: 06/05/2014 Elsevier Interactive Patient Education [employer]

## 2015-04-14 NOTE — ED Provider Notes (Signed)
CSN: [id number]     Arrival date & time 04/14/15  1330 History  By signing my name below, I, Linna Darner, attest that this documentation has been prepared under the direction and in the presence of non-physician practitioner, Jaynie Crumble, PA-C. Electronically Signed: Linna Darner, Scribe. 04/14/2015. 1:43 PM.    Chief Complaint  Patient presents with  . Shoulder Pain  . Wrist Pain    The history is provided by the patient. No language interpreter was used.     HPI Comments: Kevin Maxwell is a 65 y.o. male with h/o left shoulder problems who presents to the Emergency Department complaining of sudden onset, constant, severe, left shoulder pain beginning a couple of days ago. 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 He endorses pain with deep palpation to left shoulder. Pt does not see a specialist for his left shoulder issues. He had x-rays on his left shoulder approximately one year ago. Pt reports associated left-sided lower back pain but is ambulatory. Pt denies taking any medication for his pain. Pt's PCP is Dr. Orpah Cobb. He denies numbness or any other associated symptoms at this time.   He also complains of right wrist pain from the same incident; he states that he heard something snap in his right wrist as he picked up the stone. He endorses grip weakness in his right hand but denies any other associated symptoms at this time.    Past Medical History  Diagnosis Date  . Shoulder problem     left shoulder   Past Surgical History  Procedure Laterality Date  . Hernia repair     No family history on file. Social History  Substance Use Topics  . Smoking status: Current Every Day Smoker -- 0.50 packs/day    Types: Cigarettes  . Smokeless tobacco: Never Used  . Alcohol Use: Yes     Comment: quart to 1.5 after work each day of beer    Review of Systems   Musculoskeletal: Positive for back pain (left lower) and arthralgias (left shoulder, right wrist).  Neurological: Negative for numbness.      Allergies  Aspirin; Ibuprofen; and Norvasc  Home Medications   Prior to Admission medications   Medication Sig Start Date End Date Taking? Authorizing Provider  atenolol (TENORMIN) 25 MG tablet Take 0.5 tablets (12.5 mg total) by mouth daily. 09/04/14   Doris Cheadle, MD  cyclobenzaprine (FLEXERIL) 10 MG tablet Take 1 tablet (10 mg total) by mouth 2 (two) times daily as needed for muscle spasms. 10/31/14   Arthor Captain, PA-C  gabapentin (NEURONTIN) 300 MG capsule Take 1 capsule (300 mg total) by mouth 3 (three) times daily. 09/04/14   Doris Cheadle, MD  HYDROcodone-acetaminophen (NORCO) 5-325 MG per tablet Take 1-2 tablets by mouth every 6 (six) hours as needed for moderate pain. 10/31/14   Arthor Captain, PA-C  methocarbamol (ROBAXIN) 500 MG tablet Take 2 tablets (1,000 mg total) by mouth 4 (four) times daily as needed (Pain). 02/20/14   Nicole Pisciotta, PA-C  predniSONE (STERAPRED UNI-PAK) 10 MG tablet Take by mouth daily. Day 1: take 6 tabs.  Day 2: 5 tabs  Day 3: 4 tabs  Day 4: 3 tabs  Day 5: 2 tabs  Day 6: 1 tab Patient not taking: Reported on 09/04/2014 01/09/14   Trixie Dredge, PA-C  traMADol (ULTRAM) 50 MG tablet Take  1 tablet (50 mg total) by mouth every 12 (twelve) hours as needed. 06/15/14   Deepak Advani, MD   BP 154/97 mmHg  Pulse 77  Temp(Src) 98.1 F (36.7 C) (Oral)  Resp 14  SpO2 99% Physical Exam  Constitutional: He is oriented to person, place, and time. He appears well-developed and well-nourished. No distress.  HENT:  Head: Normocephalic and atraumatic.  Eyes: Conjunctivae and EOM are normal.  Neck: Neck supple. No tracheal deviation present.  Cardiovascular: Normal rate.   Pulmonary/Chest: Effort normal. No respiratory distress.  Musculoskeletal: Normal range of motion.  Normal-appearing left shoulder and right wrist.  Tenderness to palpation over anterior lateral left shoulder. Pain with range of motion of the shoulder. Full range of motion of the shoulder joint. Shoulder appears to be stable. Pain with external and internal rotation of the shoulder. Positive straight arm drop test. Normal-appearing right wrist. Tender to palpation of the dorsal wrist. Pain with wrist flexion, no pain with extension. Hand is normal. Distal radial pulse intact.   Neurological: He is alert and oriented to person, place, and time.  Skin: Skin is warm and dry.  Psychiatric: He has a normal mood and affect. His behavior is normal.  Nursing note and vitals reviewed.   ED Course  Procedures (including critical care time)  DIAGNOSTIC STUDIES: Oxygen Saturation is 99% on RA, normal by my interpretation.    COORDINATION OF CARE: 1:44 PM Will order DG Shoulder Left and DG Wrist Complete Right. Discussed treatment plan with pt at bedside and pt agreed to plan.   Labs Review Labs Reviewed - No data to display  Imaging Review Dg Wrist Complete Right  04/14/2015  CLINICAL DATA:  Acute right wrist pain, lifting injury 3 days ago. EXAM: RIGHT WRIST - COMPLETE 3+ VIEW COMPARISON:  None. FINDINGS: Normal alignment without acute fracture, subluxation, or dislocation. Right distal radius, ulna and carpal bones appear intact. 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 IMPRESSION: No acute osseous finding. Electronically Signed   By: Judie Petit.  Shick M.D.   On: 04/14/2015 14:58   Dg Shoulder Left  04/14/2015  CLINICAL DATA:  Left shoulder pain after lifting. EXAM: LEFT SHOULDER - 2+ VIEW COMPARISON:  01/01/2012 left shoulder radiograph FINDINGS: Stable subchondral cyst at the acromion side of the left acromioclavicular joint. New tiny erosions at the clavicular side of the left acromioclavicular joint. No fracture, dislocation, Hill-Sachs deformity or suspicious focal osseous lesion. Tiny  osteophyte at the left inferior glenoid. IMPRESSION: 1. No fracture or dislocation. 2. New tiny erosions at the clavicular side of the left acromioclavicular joint, which are nonspecific with a differential of repetitive trauma from lifting, hypoparathyroidism or rheumatoid arthritis. 3. 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 Electronically Signed   By: Delbert Phenix M.D.   On: 04/14/2015 14:58   I have personally reviewed and evaluated these images as part of my medical decision-making.   EKG Interpretation None      MDM   Final diagnoses:  Left shoulder pain  Right wrist sprain, initial encounter   Patient with what seems like chronic pain in his left shoulder that was exacerbated by lifting something heavy at work and acute right wrist pain. X-rays both negative for acute injury, showing arthritic changes. Patient requesting pain medications and muscle relaxants. I will give him 10 tablets of Norco to get him through the weekend until he can follow-up and see his primary care doctor. I  offered him a wrist splint which she states "I have to doesn't home." Also offered him a sling, patient stated "I have 5 of them at home." Patient is requesting referral to orthopedic specialist, will provider referral. Will have him follow-up.  Filed Vitals:   04/14/15 1337 04/14/15 1542  BP: 154/97 155/105  Pulse: 77 71  Temp: 98.1 F (36.7 C) 98.3 F (36.8 C)  TempSrc: Oral Oral  Resp: 14 16  SpO2: 99% 100%     Jaynie Crumble, PA-C 04/14/15 1947  Arby Barrette, MD 04/15/15 [phone]

## 2015-04-14 NOTE — ED Notes (Signed)
Pt presents with c/o right wrist pain and left shoulder pain that started on Thursday morning. Pt reports he picked up something at work and heard something snap in his wrist. Also c/o left shoulder injury from the same incident. Ambulatory to triage.

## 2015-04-28 IMAGING — CR DG FINGER MIDDLE 2+V*R*
3 series · 3 of 3 positions shown · non-contrast
Comparison: None.

CLINICAL DATA: Injury to right middle finger four months ago. Pain
in swelling.

EXAM:
RIGHT MIDDLE FINGER 2+V

[x finger pa right]
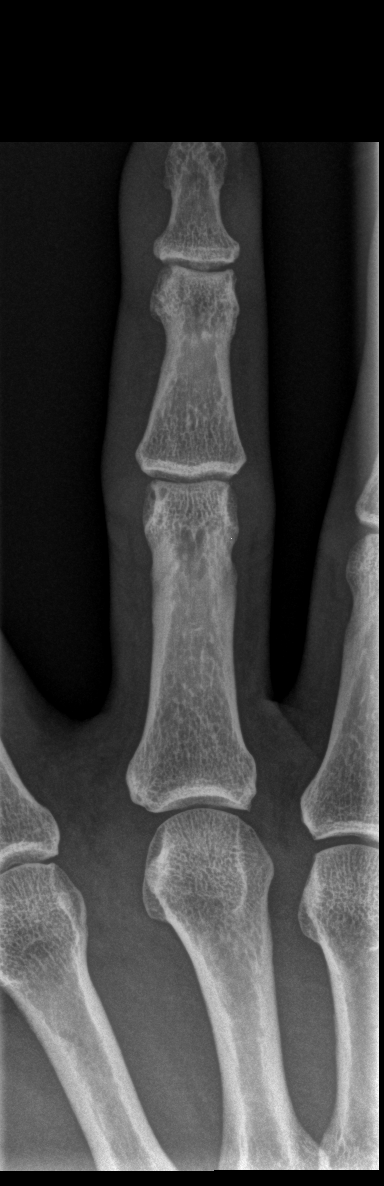

[x finger obl right]
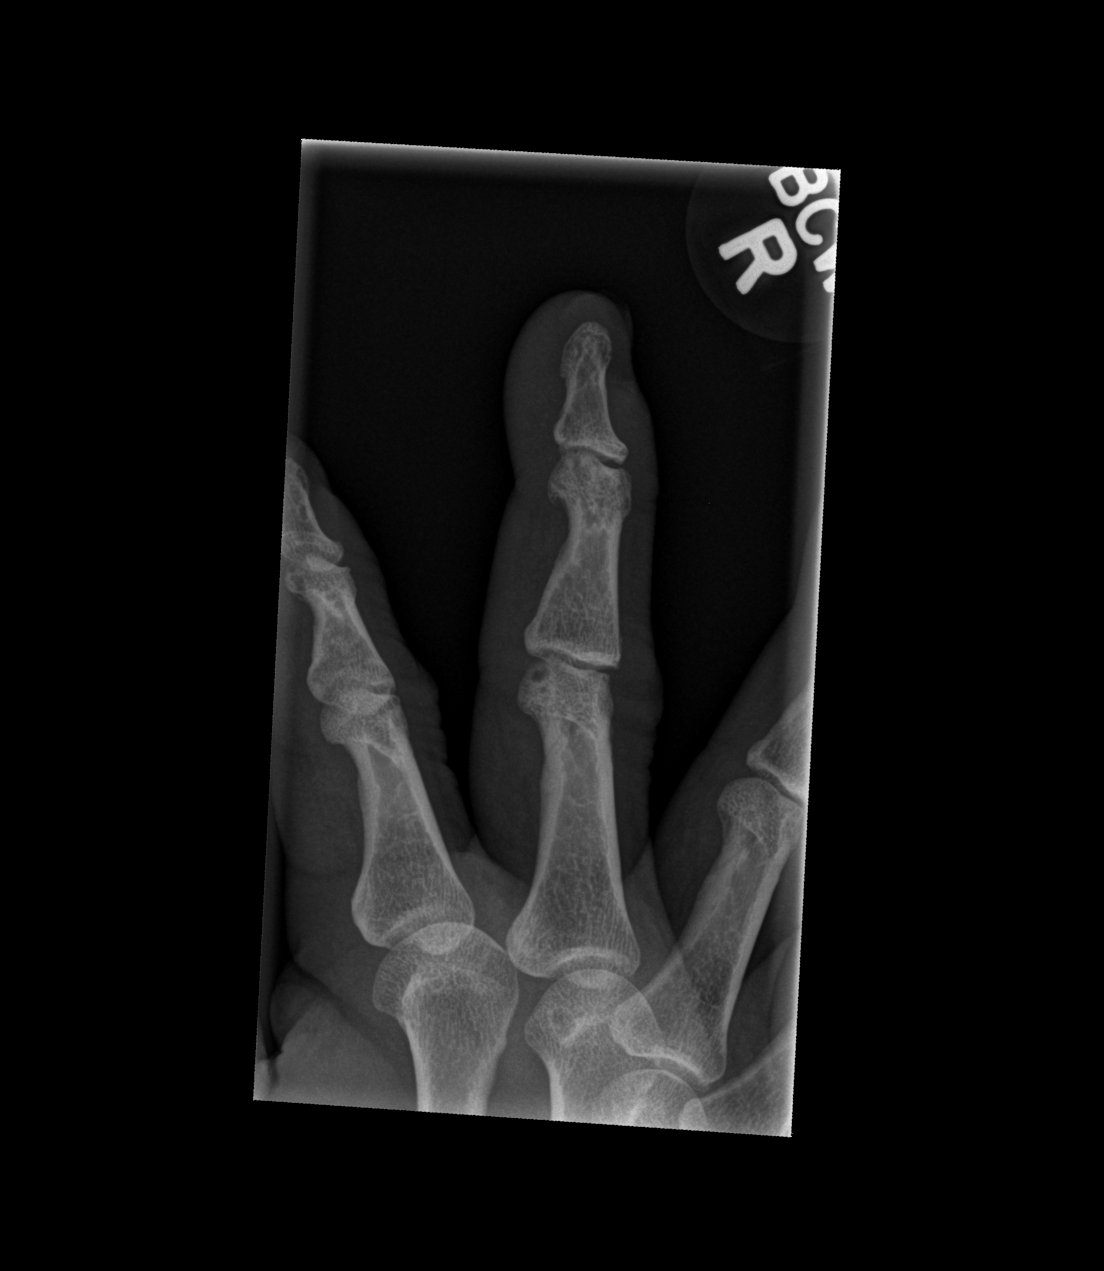

[x finger lat right]
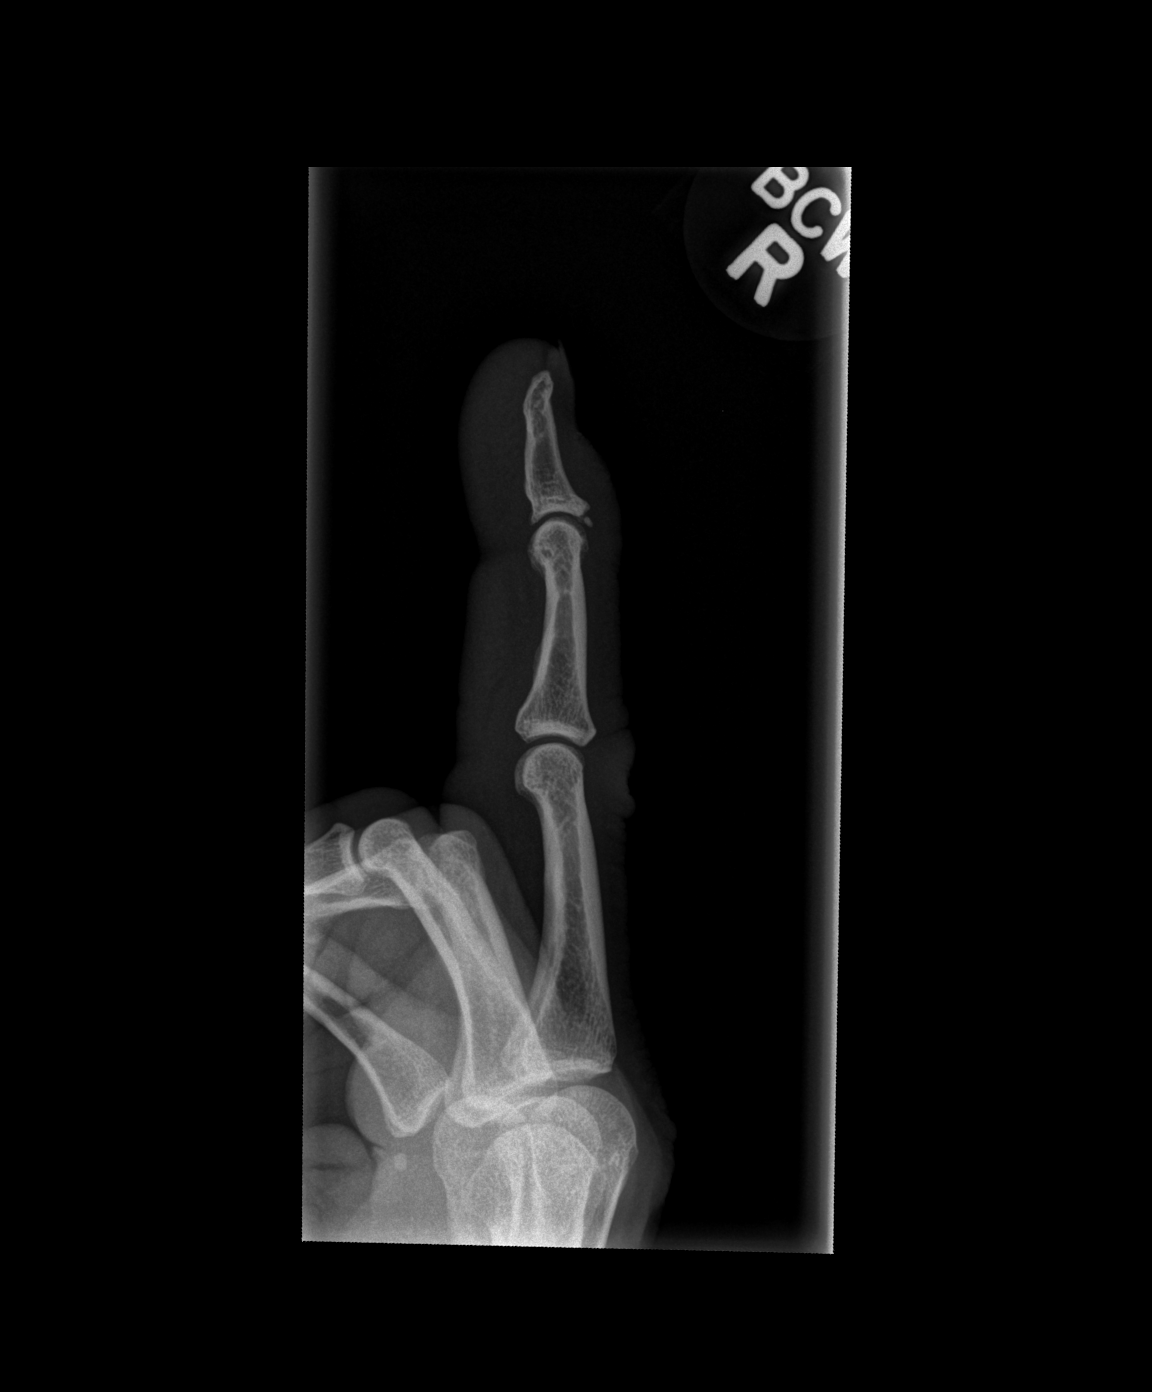

[3 of 3 positions shown; findings below may reference images not displayed]

FINDINGS: Chronic appearing dorsal plate fracture of the distal phalanx is
identified. No acute fractures or subluxations identified. No
radio-opaque foreign bodies or soft tissue calcifications.
IMPRESSION: 1. Chronic dorsal plate fracture involves the base of the distal
phalanx.

## 2015-10-26 ENCOUNTER — Emergency Department (HOSPITAL_COMMUNITY): Payer: Self-pay

## 2015-10-26 ENCOUNTER — Emergency Department (HOSPITAL_COMMUNITY)
Admission: EM | Admit: 2015-10-26 | Discharge: 2015-10-26 | Disposition: A | Discharge status: Home / Self Care | Payer: Self-pay | Attending: Emergency Medicine | Admitting: Emergency Medicine

## 2015-10-26 ENCOUNTER — Encounter (HOSPITAL_COMMUNITY): Payer: Self-pay | Admitting: Emergency Medicine

## 2015-10-26 DIAGNOSIS — W458XXA Other foreign body or object entering through skin, initial encounter: Secondary | ICD-10-CM | POA: Insufficient documentation

## 2015-10-26 DIAGNOSIS — M549 Dorsalgia, unspecified: Secondary | ICD-10-CM | POA: Insufficient documentation

## 2015-10-26 DIAGNOSIS — Z79899 Other long term (current) drug therapy: Secondary | ICD-10-CM | POA: Insufficient documentation

## 2015-10-26 DIAGNOSIS — Y990 Civilian activity done for income or pay: Secondary | ICD-10-CM | POA: Insufficient documentation

## 2015-10-26 DIAGNOSIS — R300 Dysuria: Secondary | ICD-10-CM | POA: Insufficient documentation

## 2015-10-26 DIAGNOSIS — Y929 Unspecified place or not applicable: Secondary | ICD-10-CM | POA: Insufficient documentation

## 2015-10-26 DIAGNOSIS — Y9389 Activity, other specified: Secondary | ICD-10-CM | POA: Insufficient documentation

## 2015-10-26 DIAGNOSIS — S62639A Displaced fracture of distal phalanx of unspecified finger, initial encounter for closed fracture: Secondary | ICD-10-CM

## 2015-10-26 DIAGNOSIS — S62632A Displaced fracture of distal phalanx of right middle finger, initial encounter for closed fracture: Secondary | ICD-10-CM | POA: Insufficient documentation

## 2015-10-26 DIAGNOSIS — F17210 Nicotine dependence, cigarettes, uncomplicated: Secondary | ICD-10-CM | POA: Insufficient documentation

## 2015-10-26 NOTE — ED Notes (Signed)
Made ortho aware of finger splint

## 2015-10-26 NOTE — Discharge Instructions (Signed)
Read the information below.  You may return to the Emergency Department at any time for worsening condition or any new symptoms that concern you. °

## 2015-10-26 NOTE — ED Provider Notes (Signed)
[facility]-EMERGENCY DEPT Provider Note   CSN: [id number] Arrival date & time: 10/26/15  1016     History   Chief Complaint Chief Complaint  Patient presents with  . Finger Injury    HPI Kevin Maxwell is a 65 y.o. male.  HPI   Right handed patient presents with 5 months of right middle finger pain.  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  Has soreness and hypersensitivity in the finger.  Has tried digging it out several times and has opened up pockets of pus several times as well, estimates 18-20 times.  Has taken a "big white" antitbiotic pill someone gave him as well as antibiotic cream and buys narcotics off the street to control his pain.  Denies fevers, chills, weakness or numbness of the finger, other joint pain.    Past Medical History:  Diagnosis Date  . Shoulder problem    left shoulder    Patient Active Problem List   Diagnosis Date Noted  . Trigger thumb of right hand 09/22/2013  . Smoking 08/29/2013  . Elevated BP 08/29/2013  . Pain of right thumb 08/29/2013  . Right groin pain 09/30/2012  . Hematuria 09/30/2012  . Epididymal cyst 09/30/2012    Past Surgical History:  Procedure Laterality Date  . HERNIA REPAIR         Home Medications    Prior to Admission medications   Medication Sig Start Date End Date Taking? Authorizing Provider  atenolol (TENORMIN) 25 MG tablet Take 0.5 tablets (12.5 mg total) by mouth daily. 09/04/14   Doris Cheadleeepak Advani, MD  cyclobenzaprine (FLEXERIL) 10 MG tablet Take 1 tablet (10 mg total) by mouth 2 (two) times daily as needed for muscle spasms. 10/31/14   Arthor CaptainAbigail Harris, PA-C  gabapentin (NEURONTIN) 300 MG capsule Take 1 capsule (300 mg total) by mouth 3 (three) times daily. 09/04/14   Doris Cheadleeepak Advani, MD  HYDROcodone-acetaminophen (NORCO) 5-325 MG tablet Take 1 tablet by mouth every 6 (six) hours as needed. 04/14/15   Tatyana Kirichenko, PA-C    methocarbamol (ROBAXIN) 500 MG tablet Take 2 tablets (1,000 mg total) by mouth 4 (four) times daily as needed (Pain). 02/20/14   Nicole Pisciotta, PA-C  predniSONE (STERAPRED UNI-PAK) 10 MG tablet Take by mouth daily. Day 1: take 6 tabs.  Day 2: 5 tabs  Day 3: 4 tabs  Day 4: 3 tabs  Day 5: 2 tabs  Day 6: 1 tab Patient not taking: Reported on 09/04/2014 01/09/14   Trixie DredgeEmily Hannelore Bova, PA-C  traMADol (ULTRAM) 50 MG tablet Take 1 tablet (50 mg total) by mouth every 12 (twelve) hours as needed. 06/15/14   Doris Cheadleeepak Advani, MD    Family History No family history on file.  Social History Social History  Substance Use Topics  . Smoking status: Current Every Day Smoker    Packs/day: 0.50    Types: Cigarettes  . Smokeless tobacco: Never Used  . Alcohol use Yes     Comment: quart to 1.5 after work each day of beer     Allergies   Aspirin; Ibuprofen; and Norvasc [amlodipine besylate]   Review of Systems Review of Systems  Genitourinary: Positive for dysuria (chronic, does not want this checked today).  Musculoskeletal: Positive for back pain (chronic, unchanged, sharp, with movement).  All other systems reviewed and are negative.    Physical Exam Updated Vital Signs BP 133/99 (BP Location:  Left Arm)   Pulse 77   Temp 98.5 F (36.9 C) (Oral)   Resp 16   SpO2 99%   Physical Exam  Constitutional: He appears well-developed and well-nourished. No distress.  HENT:  Head: Normocephalic and atraumatic.  Neck: Neck supple.  Pulmonary/Chest: Effort normal.  Abdominal: Soft. He exhibits no distension and no mass. There is no tenderness. There is no guarding.  Musculoskeletal:  Spine nontender, no crepitus, or stepoffs.   Right 3rd finger with dorsal swelling (chronic appearing) over DIP.  Abnormality of distal nail with dip in the middle portion - pt states this is from a separate injury.  Full AROM, sensation intact.  Hypersensitive to light touch over DIP.  No erythema, warmth, discharge.  No  fluctuance.    Neurological: He is alert.  Gait is normal   Skin: He is not diaphoretic.  Nursing note and vitals reviewed.    ED Treatments / Results  Labs (all labs ordered are listed, but only abnormal results are displayed) Labs Reviewed - No data to display  EKG  EKG Interpretation None       Radiology Dg Finger Middle Right  Result Date: 10/26/2015 CLINICAL DATA:  Injury to right middle finger four months ago. Pain in swelling. EXAM: RIGHT MIDDLE FINGER 2+V COMPARISON:  None. FINDINGS: Chronic appearing dorsal plate fracture of the distal phalanx is identified. No acute fractures or subluxations identified. No radio-opaque foreign bodies or soft tissue calcifications. IMPRESSION: 1. Chronic dorsal plate fracture involves the base of the distal phalanx. Electronically Signed   By: Signa Kell M.D.   On: 10/26/2015 12:08    Procedures Procedures (including critical care time)  Medications Ordered in ED Medications - No data to display   Initial Impression / Assessment and Plan / ED Course  I have reviewed the triage vital signs and the nursing notes.  Pertinent labs & imaging results that were available during my care of the patient were reviewed by me and considered in my medical decision making (see chart for details).  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  No evidence of infection today.  Xray demonstrates chronic dorsal plate fracture.   D/C home with finger splint, hand surgery follow up.  Discussed result, findings, treatment, and follow up  with patient.  Pt given return precautions.  Pt verbalizes understanding and agrees with plan.      Final Clinical Impressions(s) / ED Diagnoses   Final diagnoses:  Closed avulsion fracture of distal phalanx of finger, initial encounter    New Prescriptions Discharge Medication List as of 10/26/2015 12:43 PM       Trixie Dredge,  PA-C 10/26/15 1423    Rolland Porter, MD 11/06/15 2337

## 2015-10-26 NOTE — ED Triage Notes (Signed)
Patient states that about 4 months ago got injuried on right middle finger with black rock. Patient has taken knife and gotten out rock and lanced it himself about 17-18 times. Patient states he also took antibiotic pill and cream that friend gave him but finger still having some swelling and pain.  Patient also requesting paper work stating that he is disabled that he can take over to get disability with.

## 2016-03-31 ENCOUNTER — Ambulatory Visit (HOSPITAL_COMMUNITY)
Admission: RE | Admit: 2016-03-31 | Discharge: 2016-03-31 | Disposition: A | Discharge status: Home / Self Care | Payer: Self-pay | Source: Ambulatory Visit | Attending: Family Medicine | Admitting: Family Medicine

## 2016-03-31 ENCOUNTER — Ambulatory Visit: Payer: Self-pay | Attending: Family Medicine | Admitting: Family Medicine

## 2016-03-31 ENCOUNTER — Encounter: Payer: Self-pay | Admitting: Family Medicine

## 2016-03-31 VITALS — BP 142/86 | HR 88 | Temp 98.5°F | Resp 18 | Ht 69.0 in | Wt 164.4 lb

## 2016-03-31 DIAGNOSIS — G8929 Other chronic pain: Secondary | ICD-10-CM | POA: Insufficient documentation

## 2016-03-31 DIAGNOSIS — Z8781 Personal history of (healed) traumatic fracture: Secondary | ICD-10-CM | POA: Insufficient documentation

## 2016-03-31 DIAGNOSIS — R202 Paresthesia of skin: Secondary | ICD-10-CM | POA: Insufficient documentation

## 2016-03-31 DIAGNOSIS — M25421 Effusion, right elbow: Secondary | ICD-10-CM | POA: Insufficient documentation

## 2016-03-31 DIAGNOSIS — M25521 Pain in right elbow: Secondary | ICD-10-CM | POA: Insufficient documentation

## 2016-03-31 DIAGNOSIS — R200 Anesthesia of skin: Secondary | ICD-10-CM | POA: Insufficient documentation

## 2016-03-31 DIAGNOSIS — M2550 Pain in unspecified joint: Secondary | ICD-10-CM

## 2016-03-31 DIAGNOSIS — I10 Essential (primary) hypertension: Secondary | ICD-10-CM | POA: Insufficient documentation

## 2016-03-31 LAB — URIC ACID: URIC ACID, SERUM: 5 mg/dL (ref 4.0–8.0)

## 2016-03-31 IMAGING — DX DG ELBOW COMPLETE 3+V*R*
4 series · 4 of 4 positions shown · non-contrast
Comparison: None.

CLINICAL DATA: Elbow pain, lump posteriorly.

EXAM:
RIGHT ELBOW - COMPLETE 3+ VIEW

[x elbow ap right]
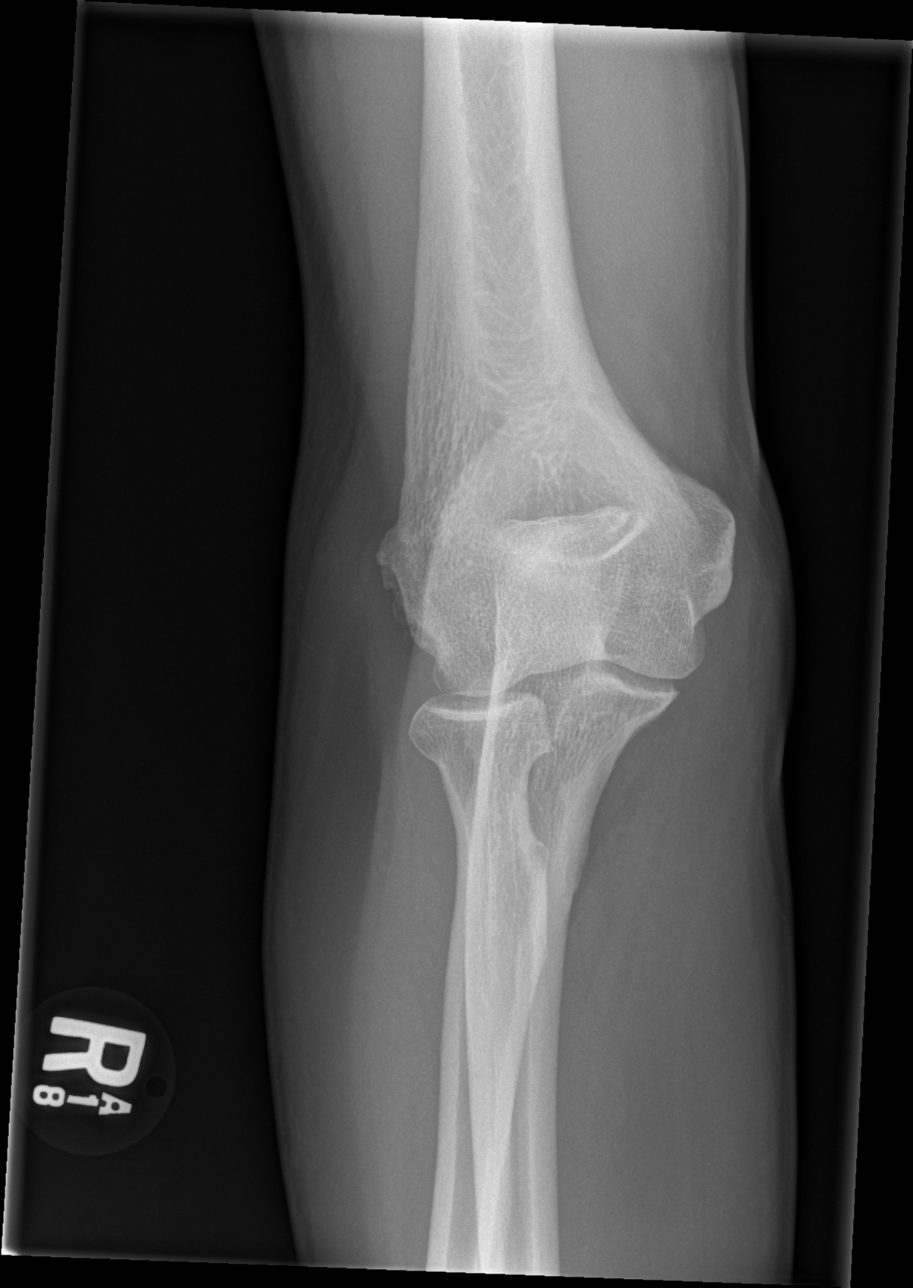

[x elbow obl right (1 of 2)]
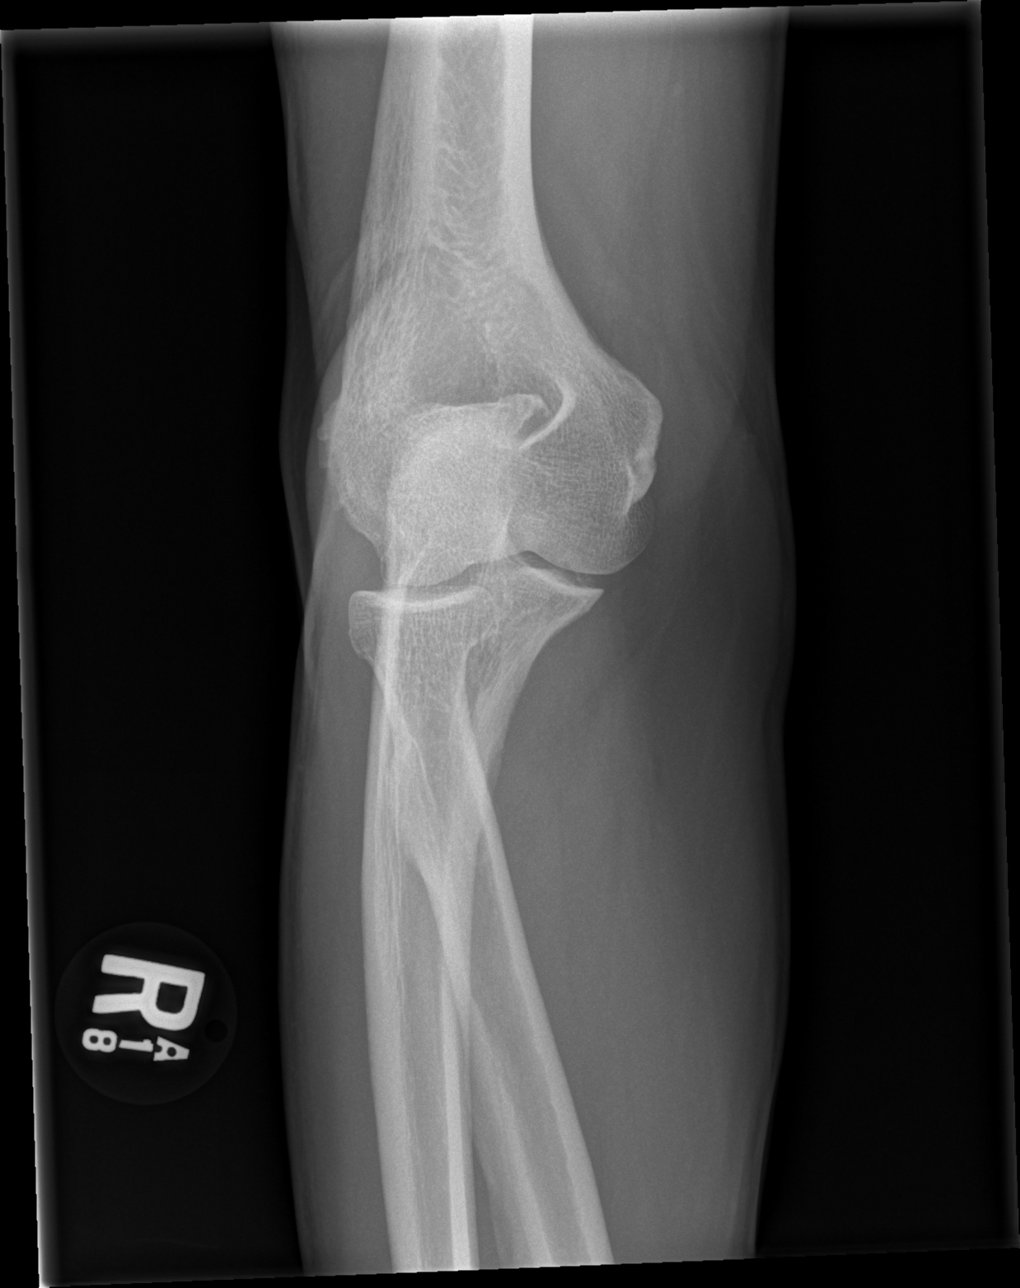

[x elbow obl right (2 of 2)]
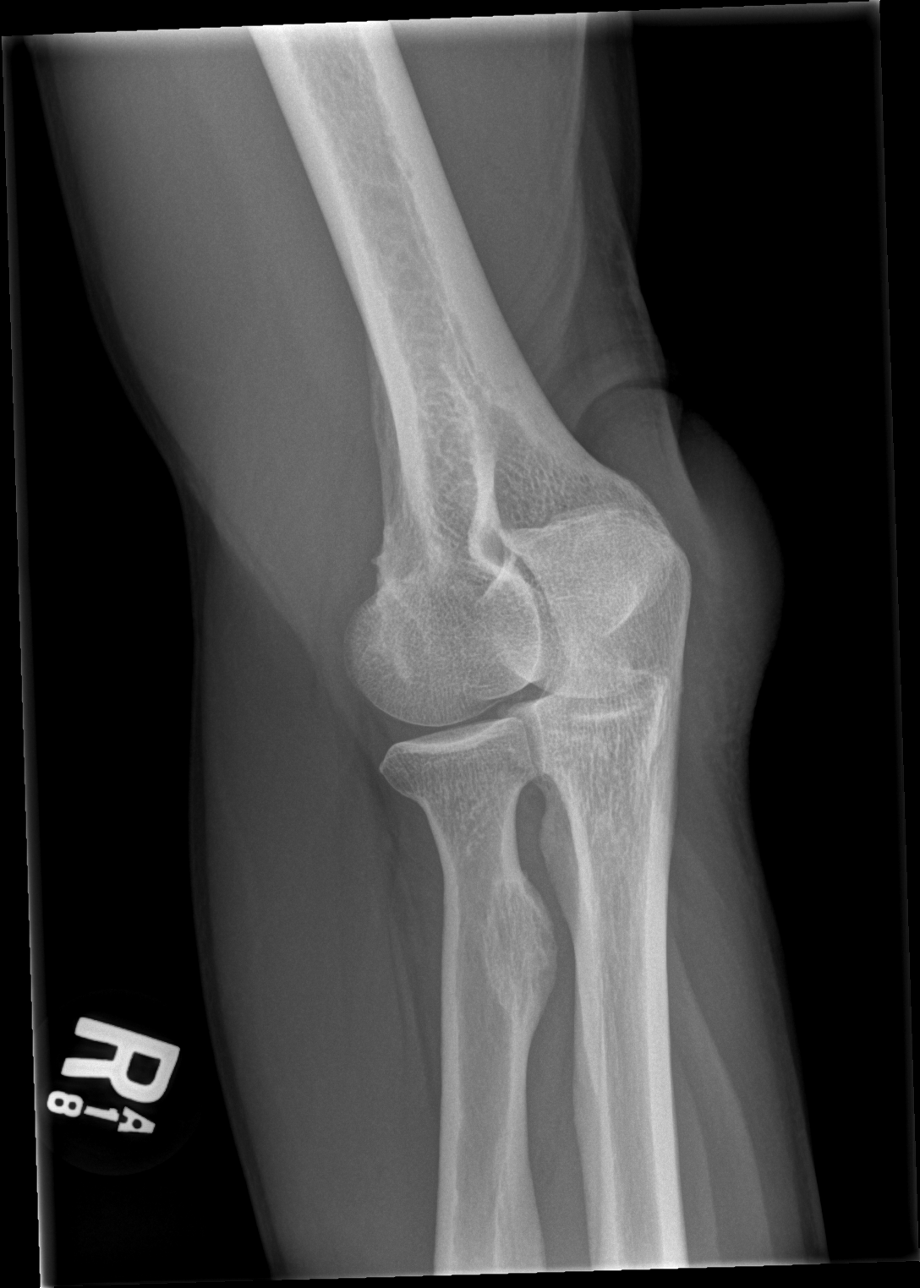

[x elbow lat right]
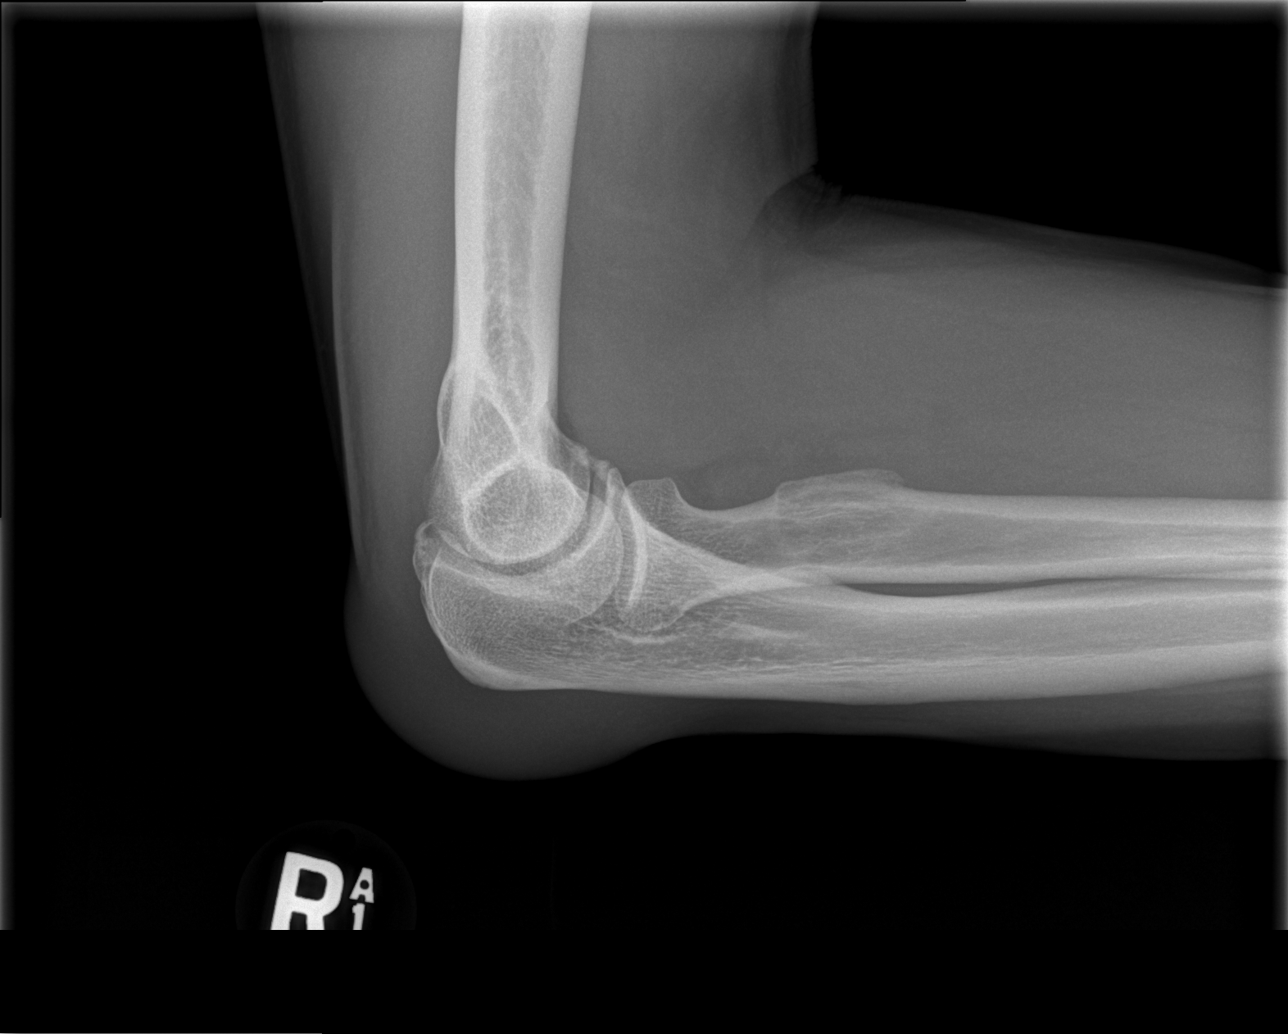

[4 of 4 positions shown; findings below may reference images not displayed]

FINDINGS: Soft tissue swelling noted posteriorly over the olecranon process,
likely bursitis. No joint effusion. No fracture, subluxation or
dislocation. No focal bony abnormality.
IMPRESSION: Posterior soft tissue swelling, likely olecranon bursitis. No acute
bony abnormality.

## 2016-03-31 MED ORDER — ACETAMINOPHEN-CODEINE 300-30 MG PO TABS: 2.0000 | ORAL_TABLET | Freq: Four times a day (QID) | ORAL | 0 refills | Status: DC | PRN

## 2016-03-31 MED ORDER — GABAPENTIN 300 MG PO CAPS: 300.0000 mg | ORAL_CAPSULE | Freq: Every day | ORAL | 1 refills | Status: DC

## 2016-03-31 MED ORDER — HYDROCHLOROTHIAZIDE 25 MG PO TABS: 25.0000 mg | ORAL_TABLET | Freq: Every day | ORAL | 2 refills | Status: DC

## 2016-03-31 MED FILL — ACETAMINOPHEN/COD #3 TABLET: 300-30 | 5 days supply | Qty: 40 | Fill #0

## 2016-03-31 NOTE — Progress Notes (Signed)
Subjective:  Patient ID: Kevin Maxwell, male    DOB: Jan 15, 1951  Age: 66 y.o. MRN: [id number]  CC: Establish Care   HPI Kevin Maxwell presents for musculoskeletal complaints. He reports past history of right elbow injury last year. While pressure washing he reports his right elbow against a piece of steel. Reports pain, increased warmth, and swelling 2 weeks later. He reports episodes of intermitment joint swelling. He reports numbness and tingling in his right arm. Also reports injury to the right third digit in April of last year when a rock hit it. He also reports chronic joint pain, crepitus in bilateral knees, and history of broken left foot.     Outpatient Medications Prior to Visit  Medication Sig Dispense Refill  . atenolol (TENORMIN) 25 MG tablet Take 0.5 tablets (12.5 mg total) by mouth daily. 90 tablet 3  . cyclobenzaprine (FLEXERIL) 10 MG tablet Take 1 tablet (10 mg total) by mouth 2 (two) times daily as needed for muscle spasms. 20 tablet 0  . gabapentin (NEURONTIN) 300 MG capsule Take 1 capsule (300 mg total) by mouth 3 (three) times daily. 90 capsule 3  . HYDROcodone-acetaminophen (NORCO) 5-325 MG tablet Take 1 tablet by mouth every 6 (six) hours as needed. 10 tablet 0  . methocarbamol (ROBAXIN) 500 MG tablet Take 2 tablets (1,000 mg total) by mouth 4 (four) times daily as needed (Pain). 20 tablet 0  . predniSONE (STERAPRED UNI-PAK) 10 MG tablet Take by mouth daily. Day 1: take 6 tabs.  Day 2: 5 tabs  Day 3: 4 tabs  Day 4: 3 tabs  Day 5: 2 tabs  Day 6: 1 tab (Patient not taking: Reported on 09/04/2014) 21 tablet 0  . traMADol (ULTRAM) 50 MG tablet Take 1 tablet (50 mg total) by mouth every 12 (twelve) hours as needed. 30 tablet 0   No facility-administered medications prior to visit.     ROS Review of Systems  Respiratory: Negative.   Cardiovascular: Negative.   Musculoskeletal: Positive for arthralgias and joint swelling.  Allergic/Immunologic: Negative.     Objective:    BP (!) 142/86 (BP Location: Left Arm, Patient Position: Sitting, Cuff Size: Normal)   Pulse 88   Temp 98.5 F (36.9 C) (Oral)   Resp 18   Ht 5\' 9"  (1.753 m)   Wt 164 lb 6.4 oz (74.6 kg)   SpO2 99%   BMI 24.28 kg/m   BP/Weight 10/26/2015 04/14/2015 10/31/2014  Systolic BP 133 155 140  Diastolic BP 99 105 91  Wt. (Lbs) - - -  BMI - - -   Physical Exam  Cardiovascular: Normal rate, regular rhythm, normal heart sounds and intact distal pulses.   Pulmonary/Chest: Effort normal and breath sounds normal.  Musculoskeletal:       Right elbow: He exhibits decreased range of motion and swelling. Tenderness found. Olecranon process tenderness noted.  Decreased sensation right arm.  Skin: Skin is warm and dry.  Nursing note and vitals reviewed.  Assessment & Plan:   Problem List Items Addressed This Visit    None    Visit Diagnoses    Chronic joint pain    -  Primary   Relevant Medications   Acetaminophen-Codeine (TYLENOL/CODEINE #3) 300-30 MG tablet   Other Relevant Orders   AMB referral to orthopedics   Sedimentation Rate (Completed)   ANA, IFA Comprehensive Panel (Completed)   Rheumatoid factor (Completed)   Uric Acid (Completed)   Joint effusion of elbow, right  Relevant Orders   DG Elbow Complete Right (Completed)   AMB referral to orthopedics   Numbness and tingling of right arm       Relevant Medications   gabapentin (NEURONTIN) 300 MG capsule   History of fracture of finger       Relevant Orders   AMB referral to orthopedics   Elbow pain, right       Relevant Orders   DG Elbow Complete Right (Completed)   AMB referral to orthopedics   Essential hypertension       -Found to be elevated this visit antihypertensive prescribed.    Relevant Medications   hydrochlorothiazide (HYDRODIURIL) 25 MG tablet     Meds ordered this encounter  Medications  . hydrochlorothiazide (HYDRODIURIL) 25 MG tablet    Sig: Take 1 tablet (25 mg total) by mouth daily.    Dispense:   30 tablet    Refill:  2    Order Specific Question:   Supervising Provider    Answer:   Quentin AngstJEGEDE, OLUGBEMIGA E [id number]  . Acetaminophen-Codeine (TYLENOL/CODEINE #3) 300-30 MG tablet    Sig: Take 2 tablets by mouth every 6 (six) hours as needed for pain.    Dispense:  40 tablet    Refill:  0    Order Specific Question:   Supervising Provider    Answer:   Quentin AngstJEGEDE, OLUGBEMIGA E [id number]]  . gabapentin (NEURONTIN) 300 MG capsule    Sig: Take 1 capsule (300 mg total) by mouth at bedtime.    Dispense:  30 capsule    Refill:  1    Order Specific Question:   Supervising Provider    Answer:   Quentin AngstJEGEDE, OLUGBEMIGA E [id number]    Follow-up: 2 weeks for BP check with nurse.   Kevin BarkMandesia R Catheline Hixon FNP

## 2016-03-31 NOTE — Patient Instructions (Signed)
Come back in 2 weeks for BP check with nurse.   Hypertension Hypertension is another name for high blood pressure. High blood pressure forces your heart to work harder to pump blood. A blood pressure reading has two numbers, which includes a higher number over a lower number (example: 110/72). Follow these instructions at home:  Have your blood pressure rechecked by your doctor.  Only take medicine as told by your doctor. Follow the directions carefully. The medicine does not work as well if you skip doses. Skipping doses also puts you at risk for problems.  Do not smoke.  Monitor your blood pressure at home as told by your doctor. Contact a doctor if:  You think you are having a reaction to the medicine you are taking.  You have repeat headaches or feel dizzy.  You have puffiness (swelling) in your ankles.  You have trouble with your vision. Get help right away if:  You get a very bad headache and are confused.  You feel weak, numb, or faint.  You get chest or belly (abdominal) pain.  You throw up (vomit).  You cannot breathe very well. This information is not intended to replace advice given to you by your health care provider. Make sure you discuss any questions you have with your health care provider. Document Released: 07/30/2007 Document Revised: 07/19/2015 Document Reviewed: 12/03/2012 Elsevier Interactive Patient Education  2017 [employer] Inc[employer]   Joint Pain Introduction Joint pain can be caused by many things. The joint can be bruised, infected, weak from aging, or sore from exercise. The pain will probably go away if you follow your doctor's instructions for home care. If your joint pain continues, more tests may be needed to help find the cause of your condition. Follow these instructions at home: Watch your condition for any changes. Follow these instructions as told to lessen the pain that you are feeling:  Take medicines only as told by your doctor.  Rest  the sore joint for as long as told by your doctor. If your doctor tells you to, raise (elevate) the painful joint above the level of your heart while you are sitting or lying down.  Do not do things that cause pain or make the pain worse.  If told, put ice on the painful area:  Put ice in a plastic bag.  Place a towel between your skin and the bag.  Leave the ice on for 20 minutes, 2-3 times per day.  Wear an elastic bandage, splint, or sling as told by your doctor. Loosen the bandage or splint if your fingers or toes lose feeling (become numb) and tingle, or if they turn cold and blue.  Begin exercising or stretching the joint as told by your doctor. Ask your doctor what types of exercise are safe for you.  Keep all follow-up visits as told by your doctor. This is important. Contact a doctor if:  Your pain gets worse and medicine does not help it.  Your joint pain does not get better in 3 days.  You have more bruising or swelling.  You have a fever.  You lose 10 pounds (4.5 kg) or more without trying. Get help right away if:  You are not able to move the joint.  Your fingers or toes become numb or they turn cold and blue. This information is not intended to replace advice given to you by your health care provider. Make sure you discuss any questions you have with your health care provider.  Document Released: 01/29/2009 Document Revised: 07/19/2015 Document Reviewed: 11/22/2013  2017 Elsevier

## 2016-03-31 NOTE — Progress Notes (Signed)
Patient is here to Establish Care   Patient is not taking any current medications.  Patient has not eaten today.  Patient declined the flu vaccine today.  Patient request a dentis referral.

## 2016-04-01 LAB — SEDIMENTATION RATE: Sed Rate: 1 mm/hr (ref 0–20)

## 2016-04-01 LAB — RHEUMATOID FACTOR

## 2016-04-02 ENCOUNTER — Telehealth: Payer: Self-pay

## 2016-04-02 LAB — ANA, IFA COMPREHENSIVE PANEL
Anti Nuclear Antibody(ANA): NEGATIVE
ENA SM AB SER-ACNC: NEGATIVE
SM/RNP: <1
SSA (RO) (ENA) ANTIBODY, IGG: NEGATIVE
SSB (LA) (ENA) ANTIBODY, IGG: NEGATIVE
Scleroderma (Scl-70) (ENA) Antibody, IgG: <1
ds DNA Ab: <1 IU/mL

## 2016-04-02 NOTE — Telephone Encounter (Signed)
-----   Message from Lizbeth BarkMandesia R Hairston, FNP sent at 04/02/2016  1:42 PM EST ----- -X ray showed bursitis of the elbow this can cause pain or swelling next to a joint.  -Follow up with orthopedic referral. -ANA and RF which test for autoimmune disorders that can cause inflammation and joint pain like rheumatoid arthritis are negative. -Uric acid which tests for gout is negative.

## 2016-04-02 NOTE — Telephone Encounter (Signed)
CMA tried to call patient to inform about lab results  Telephone # that's on file automated system is telling me that the call cant be completed

## 2016-04-14 ENCOUNTER — Ambulatory Visit: Payer: Self-pay | Attending: Family Medicine | Admitting: *Deleted

## 2016-04-14 ENCOUNTER — Ambulatory Visit: Payer: Self-pay

## 2016-04-14 VITALS — BP 122/88

## 2016-04-14 DIAGNOSIS — I10 Essential (primary) hypertension: Secondary | ICD-10-CM | POA: Insufficient documentation

## 2016-04-14 DIAGNOSIS — Z79899 Other long term (current) drug therapy: Secondary | ICD-10-CM | POA: Insufficient documentation

## 2016-04-14 NOTE — Progress Notes (Signed)
Pt here for f/u BP.  Pt denies chest pain, SOB, HA, new vison concerns, or generalized swelling.  Blood pressure taken manually in both arms. Pt admits he has not been taking medications due to financial incapabilities. His practioner aware. Recommended patient return for blood pressure check in 2 weeks while on BP medications. Pt verbalized understanding. Blood pressure taken manually while patient is sitting in both arms. Nurse visit will be routed to provider for further evaluation.[name], RN, BSN

## 2016-05-29 ENCOUNTER — Other Ambulatory Visit: Payer: Self-pay | Admitting: Family Medicine

## 2016-05-29 ENCOUNTER — Ambulatory Visit: Payer: Self-pay | Attending: Family Medicine | Admitting: *Deleted

## 2016-05-29 ENCOUNTER — Telehealth: Payer: Self-pay | Admitting: *Deleted

## 2016-05-29 VITALS — BP 132/88 | HR 72 | Resp 20

## 2016-05-29 DIAGNOSIS — I10 Essential (primary) hypertension: Secondary | ICD-10-CM | POA: Insufficient documentation

## 2016-05-29 NOTE — Progress Notes (Signed)
Pt here for f/u BP check after nurse visit on 04/16/16. Pt denies chest pain, SOB, HA, new vison concerns, or generalized swelling.   He has not taken medications as prescribed. He states he has not picked them up from the pharmacy. Blood pressure taken manually while patient is sitting. BP reading 130/90 and 132/88.  Pt encouraged to pick up prescription for his BP.   Pt instructed to return 2 weeks after he picks up and  takes his BP medications. Nurse visit will be routed to provider.Guy Franco, RN, BSN

## 2016-05-29 NOTE — Telephone Encounter (Signed)
pt here for BP check today. He states he has not picked up BP medication HCTZ 2/21, since being seen as last nurse visit. Pt states he is not taking anything. Today he was asking if he can get rx for Tylenol #3 for his shoulder pain. He states he wants to pick it up at the same time. Will route message to provider.

## 2016-05-30 ENCOUNTER — Encounter: Payer: Self-pay | Admitting: Family Medicine

## 2016-05-30 ENCOUNTER — Other Ambulatory Visit: Payer: Self-pay | Admitting: Family Medicine

## 2016-05-30 NOTE — Telephone Encounter (Signed)
CMA call to inform patient regarding medication  Patient did not answer but left a VM stating the reason of the call & if its possible to return the call

## 2016-05-30 NOTE — Telephone Encounter (Signed)
Please inform patient that I do not treat chronic pain. No additional refills for narcotics will be given without an office visit first for further evaluation and to determine if referral is necessary. If he is agreeable I can prescribe him ibuprofen or naprosyn.

## 2016-05-30 NOTE — Telephone Encounter (Incomplete)
Please inform patient that I do not treat chronic pain. No additional refills will be given without an office visit first.

## 2016-07-07 ENCOUNTER — Telehealth: Payer: Self-pay | Admitting: Family Medicine

## 2016-07-07 NOTE — Telephone Encounter (Signed)
Pt called in to request a refill on his Tylenol #3. Please f/u. Thank you.

## 2016-07-07 NOTE — Telephone Encounter (Signed)
No refills for narcotic pain medication will be given without office visit. I can prescribe Celebrex or naprosyn if he is experiencing pain and is agreeable. Recommend he schedule office visit for follow up.

## 2016-07-10 NOTE — Telephone Encounter (Signed)
CMA call patient regarding medication  Patient did not answer operator said the call   can not be complete it

## 2016-07-14 ENCOUNTER — Encounter: Payer: Self-pay | Admitting: Family Medicine

## 2016-07-14 ENCOUNTER — Ambulatory Visit: Payer: Self-pay | Attending: Family Medicine | Admitting: Family Medicine

## 2016-07-14 VITALS — BP 124/72 | HR 69 | Temp 98.2°F | Resp 18 | Ht 69.0 in | Wt 163.6 lb

## 2016-07-14 DIAGNOSIS — G8929 Other chronic pain: Secondary | ICD-10-CM

## 2016-07-14 DIAGNOSIS — Z0000 Encounter for general adult medical examination without abnormal findings: Secondary | ICD-10-CM

## 2016-07-14 DIAGNOSIS — I10 Essential (primary) hypertension: Secondary | ICD-10-CM | POA: Insufficient documentation

## 2016-07-14 DIAGNOSIS — Z79899 Other long term (current) drug therapy: Secondary | ICD-10-CM | POA: Insufficient documentation

## 2016-07-14 DIAGNOSIS — M25512 Pain in left shoulder: Secondary | ICD-10-CM

## 2016-07-14 DIAGNOSIS — M2550 Pain in unspecified joint: Secondary | ICD-10-CM

## 2016-07-14 DIAGNOSIS — M25511 Pain in right shoulder: Secondary | ICD-10-CM

## 2016-07-14 DIAGNOSIS — Z8679 Personal history of other diseases of the circulatory system: Secondary | ICD-10-CM

## 2016-07-14 LAB — POCT UA - MICROALBUMIN
Albumin/Creatinine Ratio, Urine, POC: <30
CREATININE, POC: 100 mg/dL
MICROALBUMIN (UR) POC: 30 mg/L

## 2016-07-14 MED ORDER — DICLOFENAC SODIUM 1 % TD GEL: 2.0000 g | Freq: Three times a day (TID) | TRANSDERMAL | 0 refills | Status: DC | PRN

## 2016-07-14 MED ORDER — ACETAMINOPHEN-CODEINE 300-30 MG PO TABS: 1.0000 | ORAL_TABLET | Freq: Three times a day (TID) | ORAL | 0 refills | Status: DC | PRN

## 2016-07-14 MED FILL — VOLTAREN 1% GEL: 1 | 16 days supply | Qty: 100 | Fill #0

## 2016-07-14 MED FILL — ACETAMINOPHEN/COD #3 TABLET: 300-30 | 13 days supply | Qty: 40 | Fill #0

## 2016-07-14 NOTE — Progress Notes (Signed)
Patient is here for f/up  Patient has been without his medication for two months  Patient has eaten for today   Patient has joint pain

## 2016-07-14 NOTE — Patient Instructions (Addendum)
Arthritis Arthritis means joint pain. It can also mean joint disease. A joint is a place where bones come together. People who have arthritis may have:  Red joints.  Swollen joints.  Stiff joints.  Warm joints.  A fever.  A feeling of being sick. Follow these instructions at home: Pay attention to any changes in your symptoms. Take these actions to help with your pain and swelling. Medicines   Take over-the-counter and prescription medicines only as told by your doctor.  Do not take aspirin for pain if your doctor says that you may have gout. Activity   Rest your joint if your doctor tells you to.  Avoid activities that make the pain worse.  Exercise your joint regularly as told by your doctor. Try doing exercises like:  Swimming.  Water aerobics.  Biking.  Walking. Joint Care    If your joint is swollen, keep it raised (elevated) if told by your doctor.  If your joint feels stiff in the morning, try taking a warm shower.  If you have diabetes, do not apply heat without asking your doctor.  If told, apply heat to the joint:  Put a towel between the joint and the hot pack or heating pad.  Leave the heat on the area for 20-30 minutes.  If told, apply ice to the joint:  Put ice in a plastic bag.  Place a towel between your skin and the bag.  Leave the ice on for 20 minutes, 2-3 times per day.  Keep all follow-up visits as told by your doctor. Contact a doctor if:  The pain gets worse.  You have a fever. Get help right away if:  You have very bad pain in your joint.  You have swelling in your joint.  Your joint is red.  Many joints become painful and swollen.  You have very bad back pain.  Your leg is very weak.  You cannot control your pee (urine) or poop (stool). This information is not intended to replace advice given to you by your health care provider. Make sure you discuss any questions you have with your health care  provider. Document Released: 05/07/2009 Document Revised: 07/19/2015 Document Reviewed: 05/08/2014 Elsevier Interactive Patient Education  2017 [employer] Inc.  Managing Your Hypertension Hypertension is commonly called high blood pressure. This is when the force of your blood pressing against the walls of your arteries is too strong. Arteries are blood vessels that carry blood from your heart throughout your body. Hypertension forces the heart to work harder to pump blood, and may cause the arteries to become narrow or stiff. Having untreated or uncontrolled hypertension can cause heart attack, stroke, kidney disease, and other problems. What are blood pressure readings? A blood pressure reading consists of a higher number over a lower number. Ideally, your blood pressure should be below 120/80. The first ("top") number is called the systolic pressure. It is a measure of the pressure in your arteries as your heart beats. The second ("bottom") number is called the diastolic pressure. It is a measure of the pressure in your arteries as the heart relaxes. What does my blood pressure reading mean? Blood pressure is classified into four stages. Based on your blood pressure reading, your health care provider may use the following stages to determine what type of treatment you need, if any. Systolic pressure and diastolic pressure are measured in a unit called mm Hg. Normal   Systolic pressure: below 120.  Diastolic pressure: below 80. Elevated  Systolic pressure: 120-129.  Diastolic pressure: below 80. Hypertension stage 1     Diastolic pressure: 80-89. Hypertension stage 2   Systolic pressure: 140 or above.  Diastolic pressure: 90 or above. What health risks are associated with hypertension? Managing your hypertension is an important responsibility. Uncontrolled hypertension can lead to:  A heart attack.  A stroke.  A weakened blood vessel (aneurysm).  Heart failure.  Kidney  damage.  Eye damage.  Metabolic syndrome.  Memory and concentration problems. What changes can I make to manage my hypertension? Eating and drinking   Eat a diet that is high in fiber and potassium, and low in salt (sodium), added sugar, and fat. An example eating plan is called the DASH (Dietary Approaches to Stop Hypertension) diet. To eat this way:  Eat plenty of fresh fruits and vegetables. Try to fill half of your plate at each meal with fruits and vegetables.  Eat whole grains, such as whole wheat pasta, brown rice, or whole grain bread. Fill about one quarter of your plate with whole grains.  Eat low-fat diary products.  Avoid fatty cuts of meat, processed or cured meats, and poultry with skin. Fill about one quarter of your plate with lean proteins such as fish, chicken without skin, beans, eggs, and tofu.  Avoid premade and processed foods. These tend to be higher in sodium, added sugar, and fat.     Lifestyle   Work with your health care provider to maintain a healthy body weight, or to lose weight. Ask what an ideal weight is for you.  Get at least 30 minutes of exercise that causes your heart to beat faster (aerobic exercise) most days of the week. Activities may include walking, swimming, or biking.       Monitoring   Monitor your blood pressure at home as told by your health care provider. Your personal target blood pressure may vary depending on your medical conditions, your age, and other factors.  Have your blood pressure checked regularly, as often as told by your health care provider. Working with your health care provider   Review all the medicines you take with your health care provider because there may be side effects or interactions.  Talk with your health care provider about your diet, exercise habits, and other lifestyle factors that may be contributing to hypertension.  Visit your health care provider regularly. Your health care provider can  help you create and adjust your plan for managing hypertension. Will I need medicine to control my blood pressure? Your health care provider may prescribe medicine if lifestyle changes are not enough to get your blood pressure under control, and if:  Your systolic blood pressure is 130 or higher.  Your diastolic blood pressure is 80 or higher. Take medicines only as told by your health care provider. Follow the directions carefully. Blood pressure medicines must be taken as prescribed. The medicine does not work as well when you skip doses. Skipping doses also puts you at risk for problems. Contact a health care provider if:  You think you are having a reaction to medicines you have taken.  You have repeated (recurrent) headaches.  You feel dizzy.  You have swelling in your ankles.  You have trouble with your vision. Get help right away if:  You develop a severe headache or confusion.  You have unusual weakness or numbness, or you feel faint.  You have severe pain in your chest or abdomen.  You vomit repeatedly.  You have  trouble breathing. Summary  Hypertension is when the force of blood pumping through your arteries is too strong. If this condition is not controlled, it may put you at risk for serious complications.  Your personal target blood pressure may vary depending on your medical conditions, your age, and other factors. For most people, a normal blood pressure is less than 120/80.  Hypertension is managed by lifestyle changes, medicines, or both. Lifestyle changes include weight loss, eating a healthy, low-sodium diet, exercising more, and limiting alcohol. This information is not intended to replace advice given to you by your health care provider. Make sure you discuss any questions you have with your health care provider. Document Released: 11/05/2011 Document Revised: 01/09/2016 Document Reviewed: 01/09/2016 Elsevier Interactive Patient Education  2017 [employer]

## 2016-07-14 NOTE — Progress Notes (Signed)
Subjective:  Patient ID: Kevin Maxwell, male    DOB: [birth year]  Age: 66 y.o. MRN: [id number]  CC: Hypertension   HPI  [name] presents for joint/muscle pain: Patient complains of arthralgias and myalgias for which has been present for 6 months. Pain is located in both shoulder(s), is described as aching, and is constant .  Associated symptoms include: crepitation and decreased range of motion.  The patient has tried NSAIDs and narcotics  for pain, with moderate relief.  Related to injury:   No. 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   Hypertension: Patient here for follow-up of elevated blood pressure. He reports non-adherence with blood pressure medication for previous 2 months. He is exercising and is adherent to low salt diet.  Blood pressure:  He does not check blood pressures at home.  Cardiac symptoms none. Patient denies chest pain, claudication, dyspnea, lower extremity edema, near-syncope, palpitations and syncope.  Cardiovascular risk factors: advanced age (older than 58 for men, 5 for women), male gender and smoking/ tobacco exposure. He is a current smoker, 1/2 pack per day for 25 years. Use of agents associated with hypertension: none. History of target organ damage: none.   Outpatient Medications Prior to Visit  Medication Sig Dispense Refill  . Acetaminophen-Codeine (TYLENOL/CODEINE #3) 300-30 MG tablet Take 2 tablets by mouth every 6 (six) hours as needed for pain. (Patient not taking: Reported on 05/29/2016) 40 tablet 0  . gabapentin (NEURONTIN) 300 MG capsule Take 1 capsule (300 mg total) by mouth at bedtime. (Patient not taking: Reported on 05/29/2016) 30 capsule 1  . hydrochlorothiazide (HYDRODIURIL) 25 MG tablet Take 1 tablet (25 mg total) by mouth daily. (Patient not taking: Reported on 05/29/2016) 30 tablet 2   No facility-administered medications prior to visit.     ROS Review of Systems  Constitutional: Negative.   Eyes:  Negative.   Respiratory: Negative.   Cardiovascular: Negative.   Gastrointestinal: Negative.   Musculoskeletal: Positive for arthralgias and myalgias.  Skin: Negative.   Neurological: Negative.   Psychiatric/Behavioral: Negative.     Objective:  BP 124/72 (BP Location: Left Arm, Patient Position: Sitting, Cuff Size: Normal)   Pulse 69   Temp 98.2 F (36.8 C) (Oral)   Resp 18   Ht 5\' 9"  (1.753 m)   Wt 163 lb 9.6 oz (74.2 kg)   SpO2 98%   BMI 24.16 kg/m   BP/Weight 07/14/2016 05/29/2016 04/14/2016  Systolic BP 124 132 122  Diastolic BP 72 88 88  Wt. (Lbs) 163.6 - -  BMI 24.16 - -    Physical Exam  Constitutional: He is oriented to person, place, and time. He appears well-developed and well-nourished.  Eyes: Conjunctivae are normal. Pupils are equal, round, and reactive to light.  Neck: No JVD present.  Cardiovascular: Normal rate, regular rhythm, normal heart sounds and intact distal pulses.   Pulmonary/Chest: Effort normal and breath sounds normal.  Abdominal: Soft. Bowel sounds are normal.  Musculoskeletal:       Right shoulder: He exhibits decreased range of motion and pain.       Left shoulder: He exhibits decreased range of motion and pain.  Neurological: He is alert and oriented to person, place, and time. He has normal reflexes.  Skin: Skin is warm and dry.  Psychiatric: He has a normal mood and affect.  Nursing note and vitals reviewed.   Assessment & Plan:   Problem List Items Addressed This Visit  None    Visit Diagnoses    Chronic pain of both shoulders    -  Primary   Relevant Medications   Acetaminophen-Codeine (TYLENOL/CODEINE #3) 300-30 MG tablet   diclofenac sodium (VOLTAREN) 1 % GEL   Other Relevant Orders   Ambulatory referral to Orthopedics   Chronic joint pain       Relevant Medications   Acetaminophen-Codeine (TYLENOL/CODEINE #3) 300-30 MG tablet   Other Relevant Orders   Ambulatory referral to Orthopedics   Healthcare maintenance        Relevant Orders   Ambulatory referral to Gastroenterology   History of hypertension       Relevant Orders   Lipid Panel (Completed)   POCT UA - Microalbumin (Completed)      Meds ordered this encounter  Medications  . Acetaminophen-Codeine (TYLENOL/CODEINE #3) 300-30 MG tablet    Sig: Take 1 tablet by mouth every 8 (eight) hours as needed for pain.    Dispense:  40 tablet    Refill:  0    No refills without office visit    Order Specific Question:   Supervising Provider    Answer:   Quentin AngstJEGEDE, OLUGBEMIGA E [id number]]  . diclofenac sodium (VOLTAREN) 1 % GEL    Sig: Apply 2 g topically 3 (three) times daily as needed.    Dispense:  100 g    Refill:  0    Order Specific Question:   Supervising Provider    Answer:   Quentin AngstJEGEDE, OLUGBEMIGA E [[id number]]    Follow-up: Return in about 3 months (around 10/14/2016) for HTN.   Lizbeth BarkMandesia R Ko Bardon FNP

## 2016-07-15 LAB — LIPID PANEL
CHOLESTEROL TOTAL: 226 mg/dL — AB (ref 100–199)
Chol/HDL Ratio: 1.8 ratio (ref 0.0–5.0)
HDL: 125 mg/dL (ref >39)
LDL Calculated: 87 mg/dL (ref 0–99)
Triglycerides: 69 mg/dL (ref 0–149)
VLDL CHOLESTEROL CAL: 14 mg/dL (ref 5–40)

## 2016-07-23 ENCOUNTER — Telehealth: Payer: Self-pay

## 2016-07-23 NOTE — Telephone Encounter (Signed)
-----   Message from Lizbeth BarkMandesia R Hairston, FNP sent at 07/18/2016 11:03 AM EDT ----- Total cholesterol level was slightly elevated. This can increase your risk of heart disease overtime. Start eating a diet low in saturated fat. Limit your intake of fried foods, red meats, and whole milk. Recommend follow up in 1 year.

## 2016-07-23 NOTE — Telephone Encounter (Signed)
CMA call regarding lab results  Automated system said call cannot be complete at this time

## 2016-07-25 ENCOUNTER — Ambulatory Visit (INDEPENDENT_AMBULATORY_CARE_PROVIDER_SITE_OTHER): Payer: Self-pay | Admitting: Orthopedic Surgery

## 2016-08-14 ENCOUNTER — Encounter (INDEPENDENT_AMBULATORY_CARE_PROVIDER_SITE_OTHER): Payer: Self-pay | Admitting: Orthopedic Surgery

## 2016-08-14 ENCOUNTER — Ambulatory Visit (INDEPENDENT_AMBULATORY_CARE_PROVIDER_SITE_OTHER): Payer: Self-pay | Admitting: Orthopedic Surgery

## 2016-08-14 DIAGNOSIS — M19011 Primary osteoarthritis, right shoulder: Secondary | ICD-10-CM

## 2016-08-14 DIAGNOSIS — M25511 Pain in right shoulder: Secondary | ICD-10-CM

## 2016-08-14 DIAGNOSIS — M19012 Primary osteoarthritis, left shoulder: Secondary | ICD-10-CM

## 2016-08-14 DIAGNOSIS — M25512 Pain in left shoulder: Secondary | ICD-10-CM

## 2016-08-14 DIAGNOSIS — G8929 Other chronic pain: Secondary | ICD-10-CM

## 2016-08-14 MED ORDER — TRIAMCINOLONE ACETONIDE 40 MG/ML IJ SUSP
20.0000 mg | INTRAMUSCULAR | Status: AC | PRN
  Administered 2016-08-14: 20 mg via INTRA_ARTICULAR

## 2016-08-14 MED ORDER — LIDOCAINE HCL 1 % IJ SOLN
3.0000 mL | INTRAMUSCULAR | Status: AC | PRN
  Administered 2016-08-14: 3 mL

## 2016-08-14 MED ORDER — BUPIVACAINE HCL 0.25 % IJ SOLN
0.6600 mL | INTRAMUSCULAR | Status: AC | PRN
  Administered 2016-08-14: .66 mL via INTRA_ARTICULAR

## 2016-08-14 NOTE — Progress Notes (Signed)
Office Visit Note   Patient: Kevin Maxwell           Date of Birth: [birth year]           MRN: [id number] Visit Date: 08/14/2016 Requested by: Lizbeth Bark, FNP [address] [place], [place] [address] PCP: Lizbeth Bark, FNP  Subjective: Chief Complaint  Patient presents with  . Left Shoulder - Pain  . Right Shoulder - Pain  Kevin Maxwell is a 66 year old patient with bilateral shoulder pain left worse than right.  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  He reports left greater than right shoulder pain.  He is right-hand-dominant.  Takes Tylenol 3 for pain.  It will wake him from sleep at night.  He localizes the pain to the superior aspect of the shoulder.  Denies any radicular symptoms.  Outside radiographs are reviewed and he does have cystic changes on both sides of the acromioclavicular joint.  Overhead motion tends to cause increase in pain. Patient also describes right index finger pain at the base of the nail.  He's had some type of infection there which he treated himself.  Currently it is painful but not draining or actively problematic other than pain when he hits it.  He has tried Voltaren gel which does not help for any of his shoulder complaints.  HPI: See above              ROS: All systems reviewed are negative as they relate to the chief complaint within the history of present illness.  Patient denies  fevers or chills.   Assessment & Plan: Visit Diagnoses:  1. 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  Plan is for injection into the acromioclavicular joint under ultrasound guidance.  We'll also try him with topical anti-inflammatory samples.  If this helps he should come back and have the right acromioclavicular joint and injected in 7-10 days.  The right index finger may require formal surgical debridement and nail removal.  I don't know if  it's bothering him enough for that type of intervention yet.  We'll see him back next week if the left shoulder is improved with the injection.  Follow-Up Instructions: Return if symptoms worsen or fail to improve.   Orders:  No orders of the defined types were placed in this encounter.  No orders of the defined types were placed in this encounter.     Procedures: Medium Joint Inj Date/Time: 08/14/2016 11:21 AM Performed by: Cammy Copa Authorized by: Cammy Copa   Consent Given by:  Patient Site marked: the procedure site was marked   Timeout: prior to procedure the correct patient, procedure, and site was verified   Indications:  Pain and diagnostic evaluation Location:  Shoulder Site:  L acromioclavicular Prep: patient was prepped and draped in usual sterile fashion   Needle Size:  25 G Needle Length:  1.5 inches Approach:  Superior Ultrasound Guided: Yes   Fluoroscopic Guidance: No   Medications:  3 mL lidocaine 1 %; 20 mg triamcinolone acetonide 40 MG/ML; 0.66 mL bupivacaine 0.25 % Aspiration Attempted: No   Patient tolerance:  Patient tolerated the procedure well with no immediate complications     Clinical Data: No additional findings.  Objective: Vital Signs: There were no vitals taken for this visit.  Physical Exam:   Constitutional: Patient appears  well-developed HEENT:  Head: Normocephalic Eyes:EOM are normal Neck: Normal range of motion Cardiovascular: Normal rate Pulmonary/chest: Effort normal Neurologic: Patient is alert Skin: Skin is warm Psychiatric: Patient has normal mood and affect    Ortho Exam: Orthopedic exam demonstrates full active and passive range of motion of both shoulders but with acromioclavicular joint tenderness to direct palpation.  Some pain with crossarm adduction but good rotator cuff strength isolatedsupraspinatus and subscap muscle testing.  He has no restriction of external rotation at 15 of abduction.  No  other masses lymph adenopathy or skin changes noted in the shoulder region.  Neck range of motion is full.  No radicular signs or symptoms he is in the arms noted.  Specialty Comments:  No specialty comments available.  Imaging: No results found.   PMFS History: Patient Active Problem List   Diagnosis Date Noted  . Trigger thumb of right hand 09/22/2013  . Smoking 08/29/2013  . Elevated BP 08/29/2013  . Pain of right thumb 08/29/2013  . Right groin pain 09/30/2012  . Hematuria 09/30/2012  . Epididymal cyst 09/30/2012   Past Medical History:  Diagnosis Date  . Shoulder problem    left shoulder    No family history on file.  Past Surgical History:  Procedure Laterality Date  . HERNIA REPAIR     Social History   Occupational History  . Not on file.   Social History Main Topics  . Smoking status: Current Every Day Smoker    Packs/day: 0.50    Types: Cigarettes  . Smokeless tobacco: Never Used  . Alcohol use Yes     Comment: quart to 1.5 after work each day of beer  . Drug use: Yes    Types: Marijuana  . Sexual activity: Not on file

## 2016-09-25 ENCOUNTER — Ambulatory Visit (INDEPENDENT_AMBULATORY_CARE_PROVIDER_SITE_OTHER): Payer: Self-pay

## 2016-09-25 ENCOUNTER — Encounter (INDEPENDENT_AMBULATORY_CARE_PROVIDER_SITE_OTHER): Payer: Self-pay | Admitting: Orthopedic Surgery

## 2016-09-25 ENCOUNTER — Ambulatory Visit (INDEPENDENT_AMBULATORY_CARE_PROVIDER_SITE_OTHER): Payer: Self-pay | Admitting: Orthopedic Surgery

## 2016-09-25 DIAGNOSIS — M25511 Pain in right shoulder: Secondary | ICD-10-CM

## 2016-09-25 DIAGNOSIS — G8929 Other chronic pain: Secondary | ICD-10-CM

## 2016-09-25 DIAGNOSIS — M5412 Radiculopathy, cervical region: Secondary | ICD-10-CM

## 2016-09-25 MED ORDER — BUPIVACAINE HCL 0.25 % IJ SOLN
0.6600 mL | INTRAMUSCULAR | Status: AC | PRN
  Administered 2016-09-25: .66 mL via INTRA_ARTICULAR

## 2016-09-25 MED ORDER — TRIAMCINOLONE ACETONIDE 40 MG/ML IJ SUSP
20.0000 mg | INTRAMUSCULAR | Status: AC | PRN
  Administered 2016-09-25: 20 mg via INTRA_ARTICULAR

## 2016-09-25 MED ORDER — LIDOCAINE HCL 1 % IJ SOLN
3.0000 mL | INTRAMUSCULAR | Status: AC | PRN
  Administered 2016-09-25: 3 mL

## 2016-09-25 NOTE — Progress Notes (Signed)
Office Visit Note   Patient: Kevin Maxwell           Date of Birth: 1950/09/19           MRN: [id number] Visit Date: 09/25/2016 Requested by: Lizbeth BarkHairston, Mandesia R, FNP [address] [place], [place] [address] PCP: Lizbeth BarkHairston, Mandesia R, FNP  Subjective: Chief Complaint  Patient presents with  . Left Shoulder - Follow-up  . Right Shoulder - Pain    HPI: Kevin Maxwell is a patient with left shoulder pain.  He had acromioclavicular joint injection 08/14/2016 any stooling well with that.  He did get significant pain relief from that injection.  He describes similar pain in the right-hand side localizing to the superior aspect of the shoulder.  Describes some radicular pain down the arm with some numbness and tingling in the right hand but the numbness and tingling very focal over about a 6 mL meter area on the dorsal aspect of the right hand.  This does not seem to be radicular nature.  He is also concerned about his right great toenail which had an impact injury 3 months ago.              ROS: All systems reviewed are negative as they relate to the chief complaint within the history of present illness.  Patient denies  fevers or chills.   Assessment & Plan: Visit Diagnoses:  1. Chronic right shoulder pain   2. Radiculopathy, cervical region     Plan: Impression is left shoulder pain acromioclavicular joint arthritis with good response to injection.  Right shoulder seems to have the same problem with no real rotator cuff weakness and normal radiographs.  Plan is ultrasound-guided acromioclavicular joint injection with Aleve samples provided to be taken one twice a day as needed for pain.  Right toenail no issues  Follow-Up Instructions: Return if symptoms worsen or fail to improve.   Orders:  Orders Placed This Encounter  Procedures  . XR Shoulder Right  . XR Cervical Spine 2 or 3 views   No orders of the defined types were placed in this encounter.     Procedures: Medium Joint  Inj Date/Time: 09/25/2016 10:47 PM Performed by: Cammy CopaEAN, SCOTT GREGORY Authorized by: Cammy CopaEAN, SCOTT GREGORY   Consent Given by:  Patient Site marked: the procedure site was marked   Timeout: prior to procedure the correct patient, procedure, and site was verified   Indications:  Pain and diagnostic evaluation Location:  Shoulder Site:  R acromioclavicular Prep: patient was prepped and draped in usual sterile fashion   Needle Size:  25 G Needle Length:  1.5 inches Approach:  Superior Ultrasound Guided: Yes   Fluoroscopic Guidance: No   Medications:  3 mL lidocaine 1 %; 20 mg triamcinolone acetonide 40 MG/ML; 0.66 mL bupivacaine 0.25 % Aspiration Attempted: No   Patient tolerance:  Patient tolerated the procedure well with no immediate complications     Clinical Data: No additional findings.  Objective: Vital Signs: There were no vitals taken for this visit.  Physical Exam:   Constitutional: Patient appears well-developed HEENT:  Head: Normocephalic Eyes:EOM are normal Neck: Normal range of motion Cardiovascular: Normal rate Pulmonary/chest: Effort normal Neurologic: Patient is alert Skin: Skin is warm Psychiatric: Patient has normal mood and affect    Ortho Exam: Orthopedic exam demonstrates good cervical spine range of motion 5 out of 5 grip EPL FPL interosseous wrist flexion-extension biceps triceps Dr. Thedore MinsSingh.  Palpable pedal pulses present.  Palpable radial pulses present.  No definite paresthesias C5 T1.  Reflexes symmetric bilateral biceps triceps.  Does have acromioclavicular joint tenderness on the right-hand pain with crossed arm adduction on the right with excellent rotator cuff strength to infraspinatus super space and subscap muscle testing on the right-hand side.  No apprehension relocation.  No other masses lymph adenopathy or skin changes noted in the right shoulder girdle region.  Specialty Comments:  No specialty comments available.  Imaging: Xr Cervical  Spine 2 Or 3 Views  Result Date: 09/25/2016 AP lateral cervical spine reviewed.  Loss of lordosis is present some degenerative changes noted in the facet joints and in the disc spaces.  No compression fractures.  Xr Shoulder Right  Result Date: 09/25/2016 AP outlet and and axillary review of right shoulder.  Acromiohumeral distance maintained.  Mild degenerative changes acromioclavicular joint.  Visualized lung fields clear.  Shoulder is located.  No fractures present.    PMFS History: Patient Active Problem List   Diagnosis Date Noted  . Trigger thumb of right hand 09/22/2013  . Smoking 08/29/2013  . Elevated BP 08/29/2013  . Pain of right thumb 08/29/2013  . Right groin pain 09/30/2012  . Hematuria 09/30/2012  . Epididymal cyst 09/30/2012   Past Medical History:  Diagnosis Date  . Shoulder problem    left shoulder    No family history on file.  Past Surgical History:  Procedure Laterality Date  . HERNIA REPAIR     Social History   Occupational History  . Not on file.   Social History Main Topics  . Smoking status: Current Every Day Smoker    Packs/day: 0.50    Types: Cigarettes  . Smokeless tobacco: Never Used  . Alcohol use Yes     Comment: quart to 1.5 after work each day of beer  . Drug use: Yes    Types: Marijuana  . Sexual activity: Not on file

## 2016-10-14 ENCOUNTER — Encounter: Payer: Self-pay | Admitting: Family Medicine

## 2016-10-14 ENCOUNTER — Ambulatory Visit: Payer: Self-pay | Attending: Family Medicine | Admitting: Family Medicine

## 2016-10-14 VITALS — BP 149/88 | HR 72 | Temp 97.9°F | Resp 18 | Ht 69.0 in | Wt 165.6 lb

## 2016-10-14 DIAGNOSIS — S90111A Contusion of right great toe without damage to nail, initial encounter: Secondary | ICD-10-CM | POA: Insufficient documentation

## 2016-10-14 DIAGNOSIS — H9312 Tinnitus, left ear: Secondary | ICD-10-CM | POA: Insufficient documentation

## 2016-10-14 DIAGNOSIS — I10 Essential (primary) hypertension: Secondary | ICD-10-CM | POA: Insufficient documentation

## 2016-10-14 DIAGNOSIS — M25511 Pain in right shoulder: Secondary | ICD-10-CM | POA: Insufficient documentation

## 2016-10-14 DIAGNOSIS — G8929 Other chronic pain: Secondary | ICD-10-CM | POA: Insufficient documentation

## 2016-10-14 DIAGNOSIS — F17210 Nicotine dependence, cigarettes, uncomplicated: Secondary | ICD-10-CM | POA: Insufficient documentation

## 2016-10-14 DIAGNOSIS — H9190 Unspecified hearing loss, unspecified ear: Secondary | ICD-10-CM | POA: Insufficient documentation

## 2016-10-14 DIAGNOSIS — H9193 Unspecified hearing loss, bilateral: Secondary | ICD-10-CM

## 2016-10-14 DIAGNOSIS — X58XXXA Exposure to other specified factors, initial encounter: Secondary | ICD-10-CM | POA: Insufficient documentation

## 2016-10-14 DIAGNOSIS — M25512 Pain in left shoulder: Secondary | ICD-10-CM | POA: Insufficient documentation

## 2016-10-14 DIAGNOSIS — Z760 Encounter for issue of repeat prescription: Secondary | ICD-10-CM | POA: Insufficient documentation

## 2016-10-14 MED ORDER — LOSARTAN POTASSIUM 25 MG PO TABS: 25.0000 mg | ORAL_TABLET | Freq: Every day | ORAL | 0 refills | Status: DC

## 2016-10-14 MED ORDER — ACETAMINOPHEN-CODEINE 300-30 MG PO TABS: 1.0000 | ORAL_TABLET | Freq: Three times a day (TID) | ORAL | 0 refills | Status: DC | PRN

## 2016-10-14 MED ORDER — DICLOFENAC SODIUM 1 % TD GEL: 2.0000 g | Freq: Three times a day (TID) | TRANSDERMAL | 0 refills | Status: DC | PRN

## 2016-10-14 NOTE — Patient Instructions (Addendum)
Apply for orange card if you dont have one to complete referral process.  Contusion A contusion is a deep bruise. Contusions happen when an injury causes bleeding under the skin. Symptoms of bruising include pain, swelling, and discolored skin. The skin may turn blue, purple, or yellow. Follow these instructions at home:  Rest the injured area.  If told, put ice on the injured area. ? Put ice in a plastic bag. ? Place a towel between your skin and the bag. ? Leave the ice on for 20 minutes, 2-3 times per day.  If told, put light pressure (compression) on the injured area using an elastic bandage. Make sure the bandage is not too tight. Remove it and put it back on as told by your doctor.  If possible, raise (elevate) the injured area above the level of your heart while you are sitting or lying down.  Take over-the-counter and prescription medicines only as told by your doctor. Contact a doctor if:  Your symptoms do not get better after several days of treatment.  Your symptoms get worse.  You have trouble moving the injured area. Get help right away if:  You have very bad pain.  You have a loss of feeling (numbness) in a hand or foot.  Your hand or foot turns pale or cold. This information is not intended to replace advice given to you by your health care provider. Make sure you discuss any questions you have with your health care provider. Document Released: 07/30/2007 Document Revised: 07/19/2015 Document Reviewed: 06/28/2014 Elsevier Interactive Patient Education  2018 [employer]. Tinnitus Tinnitus refers to hearing a sound when there is no actual source for that sound. This is often described as ringing in the ears. However, people with this condition may hear a variety of noises. A person may hear the sound in one ear or in both ears. The sounds of tinnitus can be soft, loud, or somewhere in between. Tinnitus can last for a few seconds or can be constant for days. It may  go away without treatment and come back at various times. When tinnitus is constant or happens often, it can lead to other problems, such as trouble sleeping and trouble concentrating. Almost everyone experiences tinnitus at some point. Tinnitus that is long-lasting (chronic) or comes back often is a problem that may require medical attention. What are the causes? The cause of tinnitus is often not known. In some cases, it can result from other problems or conditions, including:  Exposure to loud noises from machinery, music, or other sources.  Hearing loss.  Ear or sinus infections.  Earwax buildup.  A foreign object in the ear.  Use of certain medicines.  Use of alcohol and caffeine.  High blood pressure.  Heart diseases.  Anemia.  Allergies.  Meniere disease.  Thyroid problems.  Tumors.  An enlarged part of a weakened blood vessel (aneurysm).  What are the signs or symptoms? The main symptom of tinnitus is hearing a sound when there is no source for that sound. It may sound like:  Buzzing.  Roaring.  Ringing.  Blowing air, similar to the sound heard when you listen to a seashell.  Hissing.  Whistling.  Sizzling.  Humming.  Running water.  A sustained musical note.  How is this diagnosed? Tinnitus is diagnosed based on your symptoms. Your health care provider will do a physical exam. A comprehensive hearing exam (audiologic exam) will be done if your tinnitus:  Affects only one ear (unilateral).  Causes hearing difficulties.  Lasts 6 months or longer.  You may also need to see a health care provider who specializes in hearing disorders (audiologist). You may be asked to complete a questionnaire to determine the severity of your tinnitus. Tests may be done to help determine the cause and to rule out other conditions. These can include:  Imaging studies of your head and brain, such as: ? A CT scan. ? An MRI.  An imaging study of your blood  vessels (angiogram).  How is this treated? Treating an underlying medical condition can sometimes make tinnitus go away. If your tinnitus continues, other treatments may include:  Medicines, such as certain antidepressants or sleeping aids.  Sound generators to mask the tinnitus. These include: ? Tabletop sound machines that play relaxing sounds to help you fall asleep. ? Wearable devices that fit in your ear and play sounds or music. ? A small device that uses headphones to deliver a signal embedded in music (acoustic neural stimulation). In time, this may change the pathways of your brain and make you less sensitive to tinnitus. This device is used for very severe cases when no other treatment is working.  Therapy and counseling to help you manage the stress of living with tinnitus.  Using hearing aids or cochlear implants, if your tinnitus is related to hearing loss.  Follow these instructions at home:  When possible, avoid being in loud places and being exposed to loud sounds.  Wear hearing protection, such as earplugs, when you are exposed to loud noises.  Do not take stimulants, such as nicotine, alcohol, or caffeine.  Practice techniques for reducing stress, such as meditation, yoga, or deep breathing.  Use a white noise machine, a humidifier, or other devices to mask the sound of tinnitus.  Sleep with your head slightly raised. This may reduce the impact of tinnitus.  Try to get plenty of rest each night. Contact a health care provider if:  You have tinnitus in just one ear.  Your tinnitus continues for 3 weeks or longer without stopping.  Home care measures are not helping.  You have tinnitus after a head injury.  You have tinnitus along with any of the following: ? Dizziness. ? Loss of balance. ? Nausea and vomiting. This information is not intended to replace advice given to you by your health care provider. Make sure you discuss any questions you have with  your health care provider. Document Released: 02/10/2005 Document Revised: 10/14/2015 Document Reviewed: 07/13/2013 Elsevier Interactive Patient Education  2018 [employer].

## 2016-10-14 NOTE — Progress Notes (Signed)
Patient is here for f/up HTN   Patient complains about clicking noise in his left ear  Patient complains about big toe damage nerve

## 2016-10-14 NOTE — Progress Notes (Signed)
Subjective:  Patient ID: Kevin Maxwell, male    DOB: Sep 22, 1950  Age: 66 y.o. MRN: [id number]  CC: Hypertension   HPI Thuan Tippett presents for complains of chronic arthralgias and myalgias. Pain is located in both shoulder(s), is described as aching, and is constant .  Associated symptoms include: crepitation and decreased range of motion.  The patient has tried NSAIDs and narcotics  for pain, with moderate relief.  He reports following up with orthopedic referral and received injections which he reports was effective. Related to injury: No. 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 He is requesting medication refill. He also reports history of metal objecting falling and hitting his toe late last week. He denies any limited ROM or bleeding. Associated symptoms: tenderness, mild swelling. History of  elevated blood pressure. He reports not taking anything. He is active and is adherent to low salt diet.  Blood pressure:  He does not check blood pressures at home. Cardiac symptoms none. Patient denies chest pain, claudication, dyspnea, lower extremity edema, near-syncope, palpitations and syncope.  Cardiovascular risk factors: advanced age (older than 91 for men, 12 for women), male gender and smoking/ tobacco exposure. He is a current smoker, 1/2 pack per day for 25 years. Use of agents associated with hypertension: none. History of target organ damage: none.He complaints of tinnitus. Onset of symptoms was  2 weeks ago ago with gradually worsening course since that time. He reports symptoms have worsned within the last 2 weeks. Patient describes the tinnitus as stable located in the bilateral ear. The sounds like clicking. Associated symptoms include hearing loss which gradually began 1 years ago.   Previous treatments include none.  Outpatient Medications Prior to Visit  Medication Sig Dispense Refill  . Acetaminophen-Codeine (TYLENOL/CODEINE #3) 300-30 MG tablet Take 1  tablet by mouth every 8 (eight) hours as needed for pain. (Patient not taking: Reported on 10/14/2016) 40 tablet 0  . diclofenac sodium (VOLTAREN) 1 % GEL Apply 2 g topically 3 (three) times daily as needed. (Patient not taking: Reported on 10/14/2016) 100 g 0   No facility-administered medications prior to visit.     ROS Review of Systems  Constitutional: Negative.   HENT: Positive for tinnitus.   Respiratory: Negative.   Cardiovascular: Negative.   Gastrointestinal: Negative.   Musculoskeletal: Positive for arthralgias and myalgias.  Skin: Negative.   Psychiatric/Behavioral: Negative.    Objective:  BP (!) 149/88 (BP Location: Left Arm, Patient Position: Sitting, Cuff Size: Normal)   Pulse 72   Temp 97.9 F (36.6 C) (Oral)   Resp 18   Ht 5\' 9"  (1.753 m)   Wt 165 lb 9.6 oz (75.1 kg)   SpO2 100%   BMI 24.45 kg/m   BP/Weight 10/14/2016 07/14/2016 05/29/2016  Systolic BP 149 124 132  Diastolic BP 88 72 88  Wt. (Lbs) 165.6 163.6 -  BMI 24.45 24.16 -     Physical Exam  Constitutional: He is oriented to person, place, and time. He appears well-developed and well-nourished.  HENT:  Head: Normocephalic and atraumatic.  Right Ear: External ear normal.  Left Ear: External ear normal.  Nose: Nose normal.  Mouth/Throat: Oropharynx is clear and moist.  Eyes: Pupils are equal, round, and reactive to light. Conjunctivae are normal.  Neck: Normal range of motion. Neck supple.  Cardiovascular: Normal rate, regular rhythm, normal heart sounds and intact distal pulses.   Pulmonary/Chest: Effort normal and breath sounds normal.  Abdominal: Soft. Bowel  sounds are normal.  Musculoskeletal:       Right shoulder: He exhibits decreased range of motion and pain.       Left shoulder: He exhibits decreased range of motion and pain.       Right foot: There is normal range of motion.  Neurological: He is alert and oriented to person, place, and time. He has normal reflexes.  Skin: Skin is warm  and dry. Bruising (mild contusion to right great toe. ) noted.  Psychiatric: He has a normal mood and affect.  Nursing note and vitals reviewed.    Assessment & Plan:   Problem List Items Addressed This Visit    None    Visit Diagnoses    Contusion of right great toe without damage to nail, initial encounter    -  Primary   Chronic pain of both shoulders       Relevant Medications   Acetaminophen-Codeine (TYLENOL/CODEINE #3) 300-30 MG tablet   diclofenac sodium (VOLTAREN) 1 % GEL   Essential hypertension       BP elevated at office visit.   Schedule BP recheck in 2 weeks with nurse.   If BP is greater than 90/60 (MAP 65 or greater) but not less than 130/80 may increase dose of losartan to 50 mg QD and recheck in another 2 weeks.    Follow up with PCP in 3 months.   Relevant Medications   losartan (COZAAR) 25 MG tablet   Decreased hearing of both ears       Relevant Orders   Ambulatory referral to Audiology   Tinnitus of left ear       Relevant Orders   Ambulatory referral to Audiology      Meds ordered this encounter  Medications  . Acetaminophen-Codeine (TYLENOL/CODEINE #3) 300-30 MG tablet    Sig: Take 1 tablet by mouth every 8 (eight) hours as needed for pain.    Dispense:  40 tablet    Refill:  0    No refills without office visit    Order Specific Question:   Supervising Provider    Answer:   Quentin Angst [id number]  . losartan (COZAAR) 25 MG tablet    Sig: Take 1 tablet (25 mg total) by mouth daily.    Dispense:  30 tablet    Refill:  0    Order Specific Question:   Supervising Provider    Answer:   Quentin Angst [id number]  . diclofenac sodium (VOLTAREN) 1 % GEL    Sig: Apply 2 g topically 3 (three) times daily as needed.    Dispense:  100 g    Refill:  0    Order Specific Question:   Supervising Provider    Answer:   Quentin Angst [[id number]]    Follow-up: Return in about 2 weeks (around 10/28/2016), or if symptoms worsen or fail to  improve, for BP check with [facility].   Lizbeth Bark FNP

## 2016-10-24 ENCOUNTER — Ambulatory Visit: Payer: Self-pay

## 2016-11-07 ENCOUNTER — Ambulatory Visit: Payer: Self-pay

## 2016-11-17 ENCOUNTER — Ambulatory Visit: Payer: Self-pay

## 2016-11-27 ENCOUNTER — Ambulatory Visit: Payer: Self-pay | Attending: Family Medicine | Admitting: *Deleted

## 2016-11-27 VITALS — BP 126/80 | HR 74

## 2016-11-27 DIAGNOSIS — Z0131 Encounter for examination of blood pressure with abnormal findings: Secondary | ICD-10-CM | POA: Insufficient documentation

## 2016-11-27 DIAGNOSIS — I10 Essential (primary) hypertension: Secondary | ICD-10-CM

## 2016-11-27 MED FILL — ACETAMINOPHEN/COD #3 TABLET: 300-30 | 13 days supply | Qty: 40 | Fill #0

## 2016-11-27 MED FILL — LOSARTAN POTASSIUM 25 MG TA: 25 | 30 days supply | Qty: 30 | Fill #0

## 2016-11-27 NOTE — Progress Notes (Signed)
Pt arrived to [facility] for Nurse visit to check blood pressure. Pt is accompanied by wife, alert, oriented and arrives in good spirits. Last  BP was 149/88. Pt denies chest pain, SOB, HA, dizziness, or blurred vision.  Verified medication. Pt states medication was taken this morning.  Manual blood pressure reading: 134/80 and 126/80

## 2016-11-28 ENCOUNTER — Ambulatory Visit: Payer: Self-pay | Attending: Family Medicine

## 2017-01-09 ENCOUNTER — Ambulatory Visit: Payer: Self-pay | Attending: Family Medicine

## 2017-01-13 NOTE — Telephone Encounter (Signed)
This encounter was created in error - please disregard.

## 2017-04-29 ENCOUNTER — Ambulatory Visit: Payer: Self-pay | Attending: Nurse Practitioner | Admitting: Nurse Practitioner

## 2017-04-29 ENCOUNTER — Encounter: Payer: Self-pay | Admitting: Nurse Practitioner

## 2017-04-29 VITALS — BP 151/95 | HR 75 | Temp 99.1°F | Ht 69.0 in | Wt 168.8 lb

## 2017-04-29 DIAGNOSIS — M25522 Pain in left elbow: Secondary | ICD-10-CM

## 2017-04-29 DIAGNOSIS — F17210 Nicotine dependence, cigarettes, uncomplicated: Secondary | ICD-10-CM | POA: Insufficient documentation

## 2017-04-29 DIAGNOSIS — Z1211 Encounter for screening for malignant neoplasm of colon: Secondary | ICD-10-CM

## 2017-04-29 DIAGNOSIS — M545 Low back pain, unspecified: Secondary | ICD-10-CM

## 2017-04-29 DIAGNOSIS — Z79899 Other long term (current) drug therapy: Secondary | ICD-10-CM | POA: Insufficient documentation

## 2017-04-29 DIAGNOSIS — Z760 Encounter for issue of repeat prescription: Secondary | ICD-10-CM | POA: Insufficient documentation

## 2017-04-29 DIAGNOSIS — I10 Essential (primary) hypertension: Secondary | ICD-10-CM

## 2017-04-29 DIAGNOSIS — G8929 Other chronic pain: Secondary | ICD-10-CM

## 2017-04-29 DIAGNOSIS — Z886 Allergy status to analgesic agent status: Secondary | ICD-10-CM | POA: Insufficient documentation

## 2017-04-29 DIAGNOSIS — M25521 Pain in right elbow: Secondary | ICD-10-CM

## 2017-04-29 MED ORDER — ACETAMINOPHEN-CODEINE 300-30 MG PO TABS: 1.0000 | ORAL_TABLET | Freq: Three times a day (TID) | ORAL | 0 refills | Status: DC | PRN

## 2017-04-29 MED ORDER — CYCLOBENZAPRINE HCL 10 MG PO TABS: 10.0000 mg | ORAL_TABLET | Freq: Three times a day (TID) | ORAL | 1 refills | Status: DC | PRN

## 2017-04-29 MED FILL — ACETAMINOPHEN/COD #3 TABLET: 300-30 | 13 days supply | Qty: 40 | Fill #0

## 2017-04-29 MED FILL — CYCLOBENZAPRINE 10 MG TAB: 10 | 10 days supply | Qty: 30 | Fill #0

## 2017-04-29 NOTE — Progress Notes (Signed)
Assessment & Plan:  Kevin Maxwell was seen today for establish care and medication refill.  Diagnoses and all orders for this visit:  Chronic midline low back pain without sciatica -     cyclobenzaprine (FLEXERIL) 10 MG tablet; Take 1 tablet (10 mg total) by mouth 3 (three) times daily as needed for muscle spasms. Patient instructed to wear a back brace at all times when he is working Keep your weight WNL to help reduce back pain. May alternate with heat and ice application for pain relief. Other alternatives include massage, acupuncture and water aerobics.  You must stay active and avoid a sedentary lifestyle.  Essential hypertension -     CBC -     Basic metabolic panel -     Lipid panel Patient was instructed to STOP SMOKING; He declines smoking cessation aids today Continue all antihypertensives as prescribed.  Remember to bring in your blood pressure log with you for your follow up appointment.  DASH/Mediterranean Diets are healthier choices for HTN.   Bilateral Elbow Joint Pain -     cyclobenzaprine (FLEXERIL) 10 MG tablet; Take 1 tablet (10 mg total) by mouth 3 (three) times daily as needed for muscle spasms. -     Acetaminophen-Codeine (TYLENOL/CODEINE #3) 300-30 MG tablet; Take 1 tablet by mouth every 8 (eight) hours as needed for pain. May alternate heat and ice application for pain relief   Patient has been counseled on age-appropriate routine health concerns for screening and prevention. These are reviewed and up-to-date. Referrals have been placed accordingly. Immunizations are up-to-date or declined.    Subjective:   Chief Complaint  Patient presents with  . Establish Care    Patient is here to establish care for back pain. Pt's stated he have his usual back pain and would like to see if he can have muscle relaxer.   . Medication Refill    Patient need medicaion refills for Losartan.    HPI Kevin Maxwell 67 y.o. male presents to office today to establish care.   Essential  Hypertension Blood pressure not well controlled. He ran out of blood pressure medication a few months ago. He is not diet or exercise compliant. Still smoking cigarettes and eating foods high in sodium content. Denies chest pain, shortness of breath, palpitations, lightheadedness, dizziness, headaches or BLE edema.  BP Readings from Last 3 Encounters:  04/29/17 (!) 151/95  11/27/16 126/80  10/14/16 (!) 149/88    Bilateral Elbow pain Reports he fell backwards on both of his elbows a few months ago. Still experiencing soreness in bilateral elbow joints. He denies any swelling or decreased strength. Relieving factors are heat and rest. Aggravating factors are working: He does Market researcher. He has not tried arm splinting to direct heat application.   Chronic Back Pain He has chronic back pain and has been seen by ortho in the past. He states he was told he needed surgery in the past but today he reports "I'm not letting anyone cut in my back". 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 He states his low back pain is well controlled with muscle relaxants and Tylenol #3 which he uses sparingly. Reviewed [facility] Controlled Substance Reporting System. No unauthorized fills of controlled medications. Last refill 11-27-2016   Review of Systems  Constitutional: Negative for fever, malaise/fatigue and weight loss.  HENT: Negative.  Negative for nosebleeds.   Eyes: Negative.  Negative for blurred vision, double vision and photophobia.  Respiratory: Negative.  Negative for cough and shortness of breath.   Cardiovascular: Negative.  Negative for chest pain, palpitations and leg swelling.  Gastrointestinal: Negative.  Negative for heartburn, nausea and vomiting.  Musculoskeletal: Positive for back pain, myalgias and neck pain. Negative for falls.       SEE HPI  Neurological: Negative.  Negative for dizziness, focal weakness, seizures and headaches.    Psychiatric/Behavioral: Negative.  Negative for suicidal ideas.    Past Medical History:  Diagnosis Date  . Hypertension   . Shoulder problem    left shoulder    Past Surgical History:  Procedure Laterality Date  . HERNIA REPAIR      History reviewed. No pertinent family history.  Social History Reviewed with no changes to be made today.   Outpatient Medications Prior to Visit  Medication Sig Dispense Refill  . Acetaminophen-Codeine (TYLENOL/CODEINE #3) 300-30 MG tablet Take 1 tablet by mouth every 8 (eight) hours as needed for pain. 40 tablet 0  . losartan (COZAAR) 25 MG tablet Take 1 tablet (25 mg total) by mouth daily. (Patient not taking: Reported on 11/27/2016) 30 tablet 0   No facility-administered medications prior to visit.     Allergies  Allergen Reactions  . Aspirin Nausea And Vomiting  . Ibuprofen Nausea And Vomiting  . Norvasc [Amlodipine Besylate]     Itching        Objective:    BP (!) 151/95 (BP Location: Left Arm, Patient Position: Sitting, Cuff Size: Normal)   Pulse 75   Temp 99.1 F (37.3 C) (Oral)   Ht 5\' 9"  (1.753 m)   Wt 168 lb 12.8 oz (76.6 kg)   SpO2 99%   BMI 24.93 kg/m  Wt Readings from Last 3 Encounters:  04/29/17 168 lb 12.8 oz (76.6 kg)  10/14/16 165 lb 9.6 oz (75.1 kg)  07/14/16 163 lb 9.6 oz (74.2 kg)    Physical Exam  Constitutional: He is oriented to person, place, and time. He appears well-developed and well-nourished. He is cooperative.  HENT:  Head: Normocephalic and atraumatic.  Eyes: EOM are normal.  Neck: Normal range of motion.  Cardiovascular: Normal rate, regular rhythm and normal heart sounds. Exam reveals no gallop and no friction rub.  No murmur heard. Pulmonary/Chest: Effort normal and breath sounds normal. No tachypnea. No respiratory distress. He has no decreased breath sounds. He has no wheezes. He has no rhonchi. He has no rales. He exhibits no tenderness.  Abdominal: Soft. Bowel sounds are normal.   Musculoskeletal: Normal range of motion. He exhibits no edema.       Right elbow: He exhibits normal range of motion and no swelling. Tenderness found. Medial epicondyle tenderness noted.       Left elbow: He exhibits normal range of motion, no swelling and no effusion. Tenderness found. Medial epicondyle tenderness noted.  Neurological: He is alert and oriented to person, place, and time. Coordination normal.  Skin: Skin is warm and dry.  Psychiatric: He has a normal mood and affect. His behavior is normal. Judgment and thought content normal.  Nursing note and vitals reviewed.     Patient has been counseled extensively about nutrition and exercise as well as the importance of adherence with medications and regular follow-up. The patient was given clear instructions to go to ER or return to medical center if symptoms don't improve, worsen or new problems develop. The patient verbalized understanding.   Follow-up: Return in about 4 weeks (around 05/27/2017) for BP recheck/elbow pain.  Claiborne Rigg, FNP-BC [facility] [place], [place] [phone]   04/29/2017, 1:12 PM

## 2017-04-30 LAB — LIPID PANEL
CHOL/HDL RATIO: 1.9 ratio (ref 0.0–5.0)
Cholesterol, Total: 216 mg/dL — ABNORMAL HIGH (ref 100–199)
HDL: 115 mg/dL (ref >39)
LDL Calculated: 90 mg/dL (ref 0–99)
Triglycerides: 56 mg/dL (ref 0–149)
VLDL Cholesterol Cal: 11 mg/dL (ref 5–40)

## 2017-04-30 LAB — BASIC METABOLIC PANEL
BUN / CREAT RATIO: 14 (ref 10–24)
BUN: 12 mg/dL (ref 8–27)
CO2: 26 mmol/L (ref 20–29)
Calcium: 9.3 mg/dL (ref 8.6–10.2)
Chloride: 105 mmol/L (ref 96–106)
Creatinine, Ser: 0.85 mg/dL (ref 0.76–1.27)
GFR calc Af Amer: 106 mL/min/{1.73_m2} (ref >59)
GFR, EST NON AFRICAN AMERICAN: 92 mL/min/{1.73_m2} (ref >59)
Glucose: 101 mg/dL — ABNORMAL HIGH (ref 65–99)
POTASSIUM: 4.8 mmol/L (ref 3.5–5.2)
SODIUM: 143 mmol/L (ref 134–144)

## 2017-04-30 LAB — CBC
HEMATOCRIT: 43.5 % (ref 37.5–51.0)
Hemoglobin: 14.6 g/dL (ref 13.0–17.7)
MCH: 31.8 pg (ref 26.6–33.0)
MCHC: 33.6 g/dL (ref 31.5–35.7)
MCV: 95 fL (ref 79–97)
PLATELETS: 246 10*3/uL (ref 150–379)
RBC: 4.59 x10E6/uL (ref 4.14–5.80)
RDW: 14.8 % (ref 12.3–15.4)
WBC: 4.2 10*3/uL (ref 3.4–10.8)

## 2017-05-01 ENCOUNTER — Telehealth: Payer: Self-pay

## 2017-05-01 NOTE — Telephone Encounter (Signed)
-----   Message from Claiborne RiggZelda W Fleming, NP sent at 04/30/2017 10:32 AM EST ----- Labs are essentially normal. Make sure you are drinking at least 48 oz of water per day. Work on eating a low fat, heart healthy diet and participate in regular aerobic exercise program to control as well. Exercise at least 150 minutes per week.

## 2017-05-01 NOTE — Telephone Encounter (Signed)
CMA spoke to patient to inform on lab results and PCP advising.  Patient understood and no questions.  

## 2017-05-27 ENCOUNTER — Ambulatory Visit: Payer: Self-pay | Admitting: Nurse Practitioner

## 2017-06-25 ENCOUNTER — Telehealth: Payer: Self-pay | Admitting: Nurse Practitioner

## 2017-06-25 NOTE — Telephone Encounter (Signed)
Will route to PCP 

## 2017-06-25 NOTE — Telephone Encounter (Signed)
Patient called and requested for listed medication to be sent to the [facility] on [address]. Patient requested for it to be sent today if possible Acetaminophen-Codeine (TYLENOL/CODEINE #3) 300-30 MG tablet [[id number]]

## 2017-06-26 NOTE — Telephone Encounter (Signed)
CMA spoke to patient to inform above.

## 2017-06-26 NOTE — Telephone Encounter (Signed)
Needs office visit.

## 2017-07-29 ENCOUNTER — Encounter

## 2017-07-29 ENCOUNTER — Ambulatory Visit: Payer: Self-pay | Admitting: Nurse Practitioner

## 2017-07-29 ENCOUNTER — Ambulatory Visit (HOSPITAL_COMMUNITY)
Admission: EM | Admit: 2017-07-29 | Discharge: 2017-07-29 | Disposition: A | Discharge status: Home / Self Care | Payer: Self-pay | Attending: Internal Medicine | Admitting: Internal Medicine

## 2017-07-29 ENCOUNTER — Encounter (HOSPITAL_COMMUNITY): Payer: Self-pay | Admitting: Emergency Medicine

## 2017-07-29 ENCOUNTER — Ambulatory Visit (HOSPITAL_COMMUNITY): Payer: Self-pay

## 2017-07-29 DIAGNOSIS — R1031 Right lower quadrant pain: Secondary | ICD-10-CM

## 2017-07-29 NOTE — Discharge Instructions (Signed)
Due to the location of the pain you are having after having surgery for hernia it is appropriate for further evaluation with general surgery.  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 If pain becomes constant and severe, there is a firm bulging area you can feel please go to Er.

## 2017-07-29 NOTE — ED Provider Notes (Signed)
[facility]    CSN: [id number] Arrival date & time: 07/29/17  1032     History   Chief Complaint Chief Complaint  Patient presents with  . Groin Pain    HPI Kevin Maxwell is a 67 y.o. male.   Kevin Maxwell presents with complaints of right inguinal pain which has been present for the past 3-4 weeks. He feel it worsened after he did some heavy lifting at that time. Pain is primarily with coughing. Years ago did have bilateral inguinal hernia repair. Per chart review in 2014 was having similar complaints and CT and ultrasound at that time which visualized a small spermatocele as well as enlarged prostate but without hernia at that time. Denies any urinary complaints, pain frequency or night time urination. No blood in urine. Denies scrotal pain or swelling. He smokes. Does not have a regular PCP. Not taking any medications currently. Hx htn.      ROS per HPI.      Past Medical History:  Diagnosis Date  . Hypertension   . Shoulder problem    left shoulder    Patient Active Problem List   Diagnosis Date Noted  . Trigger thumb of right hand 09/22/2013  . Smoking 08/29/2013  . Elevated BP 08/29/2013  . Pain of right thumb 08/29/2013  . Right groin pain 09/30/2012  . Hematuria 09/30/2012  . Epididymal cyst 09/30/2012    Past Surgical History:  Procedure Laterality Date  . HERNIA REPAIR         Home Medications    Prior to Admission medications   Medication Sig Start Date End Date Taking? Authorizing Provider  Acetaminophen-Codeine (TYLENOL/CODEINE #3) 300-30 MG tablet Take 1 tablet by mouth every 8 (eight) hours as needed for pain. 04/29/17   Claiborne Rigg, NP  cyclobenzaprine (FLEXERIL) 10 MG tablet Take 1 tablet (10 mg total) by mouth 3 (three) times daily as needed for muscle spasms. 04/29/17   Claiborne Rigg, NP  losartan (COZAAR) 25 MG tablet Take 1 tablet (25 mg total) by mouth daily. Patient not taking: Reported on 11/27/2016 10/14/16   Lizbeth Bark, FNP    Family History History reviewed. No pertinent family history.  Social History Social History   Tobacco Use  . Smoking status: Current Every Day Smoker    Packs/day: 0.50    Types: Cigarettes  . Smokeless tobacco: Never Used  Substance Use Topics  . Alcohol use: Yes    Comment: quart to 1.5 after work each day of beer  . Drug use: Yes    Types: Marijuana     Allergies   Aspirin; Ibuprofen; and Norvasc [amlodipine besylate]   Review of Systems Review of Systems   Physical Exam Triage Vital Signs ED Triage Vitals [07/29/17 1059]  Enc Vitals Group     BP (!) 141/94     Pulse Rate 88     Resp 18     Temp 98.5 F (36.9 C)     Temp Source Oral     SpO2 100 %     Weight      Height      Head Circumference      Peak Flow      Pain Score      Pain Loc      Pain Edu?      Excl. in GC?    No data found.  Updated Vital Signs BP (!) 141/94 (BP Location: Left Arm)   Pulse 88  Temp 98.5 F (36.9 C) (Oral)   Resp 18   SpO2 100%    Physical Exam  Constitutional: He is oriented to person, place, and time. He appears well-developed and well-nourished.  Cardiovascular: Normal rate and regular rhythm.  Pulmonary/Chest: Effort normal and breath sounds normal.  Genitourinary: Testes normal and penis normal.     Genitourinary Comments: Very tender to right inguinal region; without obvious palpable hernia with coughing, no sign of strangulated hernia- soft tissue without any bulging or swelling.   Neurological: He is alert and oriented to person, place, and time.  Skin: Skin is warm and dry.     UC Treatments / Results  Labs (all labs ordered are listed, but only abnormal results are displayed) Labs Reviewed - No data to display  EKG None  Radiology No results found.  Procedures Procedures (including critical care time)  Medications Ordered in UC Medications - No data to display  Initial Impression / Assessment and Plan / UC Course  I have  reviewed the triage vital signs and the nursing notes.  Pertinent labs & imaging results that were available during my care of the patient were reviewed by me and considered in my medical decision making (see chart for details).     Question hernia possibility due to severity of pain, worse with coughing, and location with history of repair. No red flag findings concerning for strangulation. Recommended follow up with PCP/general surgery/ urology for further evaluation and treatment. Return precautions provided. Encourage decrease to quit smoking. Patient verbalized understanding and agreeable to plan.    Final Clinical Impressions(s) / UC Diagnoses   Final diagnoses:  Right inguinal pain     Discharge Instructions     Due to the location of the pain you are having after having surgery for hernia it is appropriate for further evaluation with general surgery.  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 If pain becomes constant and severe, there is a firm bulging area you can feel please go to Er.    ED Prescriptions    None     Controlled Substance Prescriptions [place] Controlled Substance Registry consulted? Not Applicable   Georgetta HaberBurky, Benard Minturn B, NP 07/29/17 1125

## 2017-07-29 NOTE — ED Triage Notes (Signed)
Pt sts right sided groin pain when coughing; pt sts hx of hernia repair

## 2017-08-11 ENCOUNTER — Ambulatory Visit: Payer: Self-pay

## 2017-08-25 ENCOUNTER — Ambulatory Visit: Payer: Self-pay | Admitting: Nurse Practitioner

## 2017-09-22 ENCOUNTER — Ambulatory Visit: Payer: Self-pay | Attending: Nurse Practitioner | Admitting: Nurse Practitioner

## 2017-09-22 ENCOUNTER — Encounter: Payer: Self-pay | Admitting: Nurse Practitioner

## 2017-09-22 VITALS — BP 142/103 | HR 72 | Temp 98.4°F | Ht 69.0 in | Wt 159.8 lb

## 2017-09-22 DIAGNOSIS — Z9889 Other specified postprocedural states: Secondary | ICD-10-CM | POA: Insufficient documentation

## 2017-09-22 DIAGNOSIS — R062 Wheezing: Secondary | ICD-10-CM | POA: Insufficient documentation

## 2017-09-22 DIAGNOSIS — G8929 Other chronic pain: Secondary | ICD-10-CM | POA: Insufficient documentation

## 2017-09-22 DIAGNOSIS — M25522 Pain in left elbow: Secondary | ICD-10-CM | POA: Insufficient documentation

## 2017-09-22 DIAGNOSIS — Z886 Allergy status to analgesic agent status: Secondary | ICD-10-CM | POA: Insufficient documentation

## 2017-09-22 DIAGNOSIS — K4090 Unilateral inguinal hernia, without obstruction or gangrene, not specified as recurrent: Secondary | ICD-10-CM | POA: Insufficient documentation

## 2017-09-22 DIAGNOSIS — Z760 Encounter for issue of repeat prescription: Secondary | ICD-10-CM | POA: Insufficient documentation

## 2017-09-22 DIAGNOSIS — M545 Low back pain: Secondary | ICD-10-CM | POA: Insufficient documentation

## 2017-09-22 DIAGNOSIS — Z09 Encounter for follow-up examination after completed treatment for conditions other than malignant neoplasm: Secondary | ICD-10-CM | POA: Insufficient documentation

## 2017-09-22 DIAGNOSIS — Z79899 Other long term (current) drug therapy: Secondary | ICD-10-CM | POA: Insufficient documentation

## 2017-09-22 DIAGNOSIS — I10 Essential (primary) hypertension: Secondary | ICD-10-CM | POA: Insufficient documentation

## 2017-09-22 DIAGNOSIS — M25521 Pain in right elbow: Secondary | ICD-10-CM | POA: Insufficient documentation

## 2017-09-22 DIAGNOSIS — Z888 Allergy status to other drugs, medicaments and biological substances status: Secondary | ICD-10-CM | POA: Insufficient documentation

## 2017-09-22 DIAGNOSIS — N400 Enlarged prostate without lower urinary tract symptoms: Secondary | ICD-10-CM | POA: Insufficient documentation

## 2017-09-22 MED ORDER — ALBUTEROL SULFATE HFA 108 (90 BASE) MCG/ACT IN AERS: 2.0000 | INHALATION_SPRAY | Freq: Four times a day (QID) | RESPIRATORY_TRACT | 2 refills | Status: AC | PRN

## 2017-09-22 MED ORDER — CYCLOBENZAPRINE HCL 10 MG PO TABS: 10.0000 mg | ORAL_TABLET | Freq: Three times a day (TID) | ORAL | 1 refills | Status: AC | PRN

## 2017-09-22 MED ORDER — LOSARTAN POTASSIUM 25 MG PO TABS
25.0000 mg | ORAL_TABLET | Freq: Every day | ORAL | 1 refills | Status: DC
Start: 2017-09-22 — End: 2020-11-19

## 2017-09-22 MED ORDER — ACETAMINOPHEN-CODEINE 300-30 MG PO TABS: 1.0000 | ORAL_TABLET | Freq: Three times a day (TID) | ORAL | 0 refills | Status: DC | PRN

## 2017-09-22 MED FILL — ACETAMINOPHEN/COD #3 TABLET: 300-30 | 13 days supply | Qty: 40 | Fill #0

## 2017-09-22 MED FILL — CYCLOBENZAPRINE 10 MG TAB: 10 | 10 days supply | Qty: 30 | Fill #0

## 2017-09-22 MED FILL — LOSARTAN POTASSIUM 25 MG TA: 25 | 30 days supply | Qty: 30 | Fill #0

## 2017-09-22 MED FILL — !PROVENTIL HFA 90 MCG INH: 108 (90 BAS | 25 days supply | Qty: 1 | Fill #0

## 2017-09-22 NOTE — Progress Notes (Signed)
Assessment & Plan:  Kevin Maxwell was seen today for follow-up and medication refill.  Diagnoses and all orders for this visit:  Enlarged prostate -     PSA  Chronic midline low back pain without sciatica -     cyclobenzaprine (FLEXERIL) 10 MG tablet; Take 1 tablet (10 mg total) by mouth 3 (three) times daily as needed for muscle spasms. -     ToxASSURE Select 13 (MW), Urine  Bilateral elbow joint pain -     cyclobenzaprine (FLEXERIL) 10 MG tablet; Take 1 tablet (10 mg total) by mouth 3 (three) times daily as needed for muscle spasms. -     Acetaminophen-Codeine (TYLENOL/CODEINE #3) 300-30 MG tablet; Take 1 tablet by mouth every 8 (eight) hours as needed for pain.  Essential hypertension -     losartan (COZAAR) 25 MG tablet; Take 1 tablet (25 mg total) by mouth daily. Continue all antihypertensives as prescribed.  Remember to bring in your blood pressure log with you for your follow up appointment.  DASH/Mediterranean Diets are healthier choices for HTN.   Right inguinal hernia -     US SCROTUM; Future  Expiratory wheezing -     albuterol (PROVENTIL HFA;VENTOLIN HFA) 108 (90 Base) MCG/ACT inhaler; Inhale 2 puffs into the lungs every 6 (six) hours as needed for wheezing or shortness of breath.  Patient has been counseled on age-appropriate routine health concerns for screening and prevention. These are reviewed and up-to-date. Referrals have been placed accordingly. Immunizations are up-to-date or declined.    Subjective:   Chief Complaint  Patient presents with  . Follow-up    Patient is here to follow-up on blood pressure and right elbow. Per patient no pain on right elbow.   . Medication Refill    Patient is requesting refill for Losartan and flexeril.    HPI Kevin Maxwell 67 y.o. male presents to office today for follow up HTN.   CHRONIC HYPERTENSION Disease Monitoring  Blood pressure range: He ran out of his medications a few weeks ago. He does not check his blood pressure at  home.  BP Readings from Last 3 Encounters:  09/22/17 (!) 142/103  07/29/17 (!) 141/94  04/29/17 (!) 151/95   Chest pain: no   Dyspnea: no   Claudication: no  Medication compliance: yes, Losartan 25mg  daily Medication Side Effects  Lightheadedness: no   Urinary frequency: no   Edema: no   Impotence: no  Preventitive Healthcare:  Exercise: no   Diet Pattern: salt not added to cooking  Salt Restriction:  no   Inguinal Hernia Patient presents for evaluation of right inguinal hernia. Symptoms were first noted 2 months ago. Symptoms did start at work. Pain is dull. Lump is not reducible. Pt has no symptoms of  chronic constipation, chronic cough, difficulty urinating. Pt. has previous history of groin surgery.  Chronic Back Pain Chronic back pain. 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 Back pain well controlled with Tylenol 3 and flexeril. He uses these sparingly. Last fill for Tylenol 3 was March 2019.  [place] [facility] [place]. No unauthorized fills of controlled medications   Review of Systems  Constitutional: Negative for fever, malaise/fatigue and weight loss.  HENT: Negative.  Negative for nosebleeds.   Eyes: Negative.  Negative for blurred vision, double vision and photophobia.  Respiratory: Negative.  Negative for cough and shortness of breath.   Cardiovascular: Negative.  Negative for chest pain, palpitations and leg swelling.  Gastrointestinal: Negative.  Negative  for heartburn, nausea and vomiting.  Genitourinary: Negative.   Musculoskeletal: Positive for back pain and joint pain. Negative for myalgias.       SEE HPI  Neurological: Negative.  Negative for dizziness, focal weakness, seizures and headaches.  Psychiatric/Behavioral: Negative.  Negative for suicidal ideas.    Past Medical History:  Diagnosis Date  . Hypertension   . Shoulder problem    left shoulder    Past Surgical History:  Procedure Laterality Date  .  HERNIA REPAIR      History reviewed. No pertinent family history.  Social History Reviewed with no changes to be made today.   Outpatient Medications Prior to Visit  Medication Sig Dispense Refill  . Acetaminophen-Codeine (TYLENOL/CODEINE #3) 300-30 MG tablet Take 1 tablet by mouth every 8 (eight) hours as needed for pain. (Patient not taking: Reported on 09/22/2017) 40 tablet 0  . cyclobenzaprine (FLEXERIL) 10 MG tablet Take 1 tablet (10 mg total) by mouth 3 (three) times daily as needed for muscle spasms. (Patient not taking: Reported on 09/22/2017) 30 tablet 1  . losartan (COZAAR) 25 MG tablet Take 1 tablet (25 mg total) by mouth daily. (Patient not taking: Reported on 11/27/2016) 30 tablet 0   No facility-administered medications prior to visit.     Allergies  Allergen Reactions  . Aspirin Nausea And Vomiting  . Ibuprofen Nausea And Vomiting  . Norvasc [Amlodipine Besylate]     Itching        Objective:    BP (!) 142/103 (BP Location: Left Arm, Patient Position: Sitting, Cuff Size: Normal)   Pulse 72   Temp 98.4 F (36.9 C) (Oral)   Ht 5\' 9"  (1.753 m)   Wt 159 lb 12.8 oz (72.5 kg)   SpO2 100%   BMI 23.60 kg/m  Wt Readings from Last 3 Encounters:  09/22/17 159 lb 12.8 oz (72.5 kg)  04/29/17 168 lb 12.8 oz (76.6 kg)  10/14/16 165 lb 9.6 oz (75.1 kg)    Physical Exam  Constitutional: He is oriented to person, place, and time. He appears well-developed and well-nourished. He is cooperative.  HENT:  Head: Normocephalic and atraumatic.  Eyes: EOM are normal.  Neck: Normal range of motion.  Cardiovascular: Normal rate, regular rhythm, normal heart sounds and intact distal pulses. Exam reveals no gallop and no friction rub.  No murmur heard. Pulmonary/Chest: Effort normal. No tachypnea. No respiratory distress. He has no decreased breath sounds. He has wheezes (throughout). He has no rhonchi. He has no rales. He exhibits no tenderness.  Abdominal: Soft. Bowel sounds are  normal.  Musculoskeletal: He exhibits no edema.       Right elbow: He exhibits swelling (bursal sac swelling ) and deformity. He exhibits normal range of motion. No tenderness (denies pain or tenderness) found.       Left elbow: No tenderness found.  Patient was encouraged to wrap right elbow. He can not take any antiinflammatories due to N/V  Neurological: He is alert and oriented to person, place, and time. Coordination normal.  Skin: Skin is warm and dry.  Psychiatric: He has a normal mood and affect. His behavior is normal. Judgment and thought content normal.  Nursing note and vitals reviewed.      Patient has been counseled extensively about nutrition and exercise as well as the importance of adherence with medications and regular follow-up. The patient was given clear instructions to go to ER or return to medical center if symptoms don't improve, worsen or new problems  develop. The patient verbalized understanding.   Follow-up: Return for 3 weeks BP Recheck nurse/ follow up with me in 3 months HTN.   Claiborne Rigg, FNP-BC [facility] [place], [place] [phone]   09/22/2017, 1:38 PM

## 2017-09-23 LAB — PSA: Prostate Specific Ag, Serum: 1 ng/mL (ref 0.0–4.0)

## 2017-09-24 ENCOUNTER — Telehealth: Payer: Self-pay

## 2017-09-24 NOTE — Progress Notes (Signed)
CMA did inform patient to try to get Financial assistance, and contact us when he do to schedule for the [place] Scrotum.  Patient understood.

## 2017-09-24 NOTE — Telephone Encounter (Signed)
-----   Message from Claiborne RiggZelda W Fleming, NP sent at 09/23/2017  9:52 PM EDT ----- Prostate level is normal

## 2017-09-24 NOTE — Telephone Encounter (Signed)
CMA spoke to patient to inform on lab results.  Patient verified DOB. Patient understood.  

## 2017-09-27 LAB — TOXASSURE SELECT 13 (MW), URINE

## 2017-09-29 ENCOUNTER — Telehealth: Payer: Self-pay

## 2017-09-29 NOTE — Telephone Encounter (Signed)
-----   Message from Claiborne RiggZelda W Fleming, NP sent at 09/28/2017  3:54 PM EDT ----- UDS was positive for marijuana. This will be last refill of pain medicine. I can refer him to pain management if he would like a referral and has  [facility] card.

## 2017-09-29 NOTE — Telephone Encounter (Signed)
CMA attempt to call patient to inform on results.  No answer and left a VM for patient to call back.  If patient call back, please inform:  UDS was positive for marijuana. This will be last refill of pain medicine. I can refer him to pain management if he would like a referral and has  [employer] card.

## 2017-09-30 ENCOUNTER — Telehealth: Payer: Self-pay | Admitting: Nurse Practitioner

## 2017-09-30 NOTE — Telephone Encounter (Signed)
Patient called I gave him his results and he said yes to the referral.

## 2017-10-09 ENCOUNTER — Ambulatory Visit: Payer: Self-pay

## 2017-10-18 ENCOUNTER — Encounter (HOSPITAL_COMMUNITY): Payer: Self-pay

## 2017-10-18 ENCOUNTER — Emergency Department (HOSPITAL_COMMUNITY): Payer: Self-pay

## 2017-10-18 ENCOUNTER — Emergency Department (HOSPITAL_COMMUNITY)
Admission: EM | Admit: 2017-10-18 | Discharge: 2017-10-18 | Disposition: A | Discharge status: Home / Self Care | Payer: Self-pay | Attending: Emergency Medicine | Admitting: Emergency Medicine

## 2017-10-18 DIAGNOSIS — M25512 Pain in left shoulder: Secondary | ICD-10-CM | POA: Insufficient documentation

## 2017-10-18 DIAGNOSIS — F17210 Nicotine dependence, cigarettes, uncomplicated: Secondary | ICD-10-CM | POA: Insufficient documentation

## 2017-10-18 DIAGNOSIS — Z79899 Other long term (current) drug therapy: Secondary | ICD-10-CM | POA: Insufficient documentation

## 2017-10-18 DIAGNOSIS — I10 Essential (primary) hypertension: Secondary | ICD-10-CM | POA: Insufficient documentation

## 2017-10-18 IMAGING — DX DG SHOULDER 2+V*L*
1 series · 3 of 3 positions shown · non-contrast
Comparison: Radiographs [DATE].

CLINICAL DATA: Acute left shoulder pain after lifting injury
several days ago.

EXAM:
LEFT SHOULDER - 2+ VIEW

[Series 1: shoulder · 0.14mm/px · 3 of 3 slices shown]
[im 1/3]
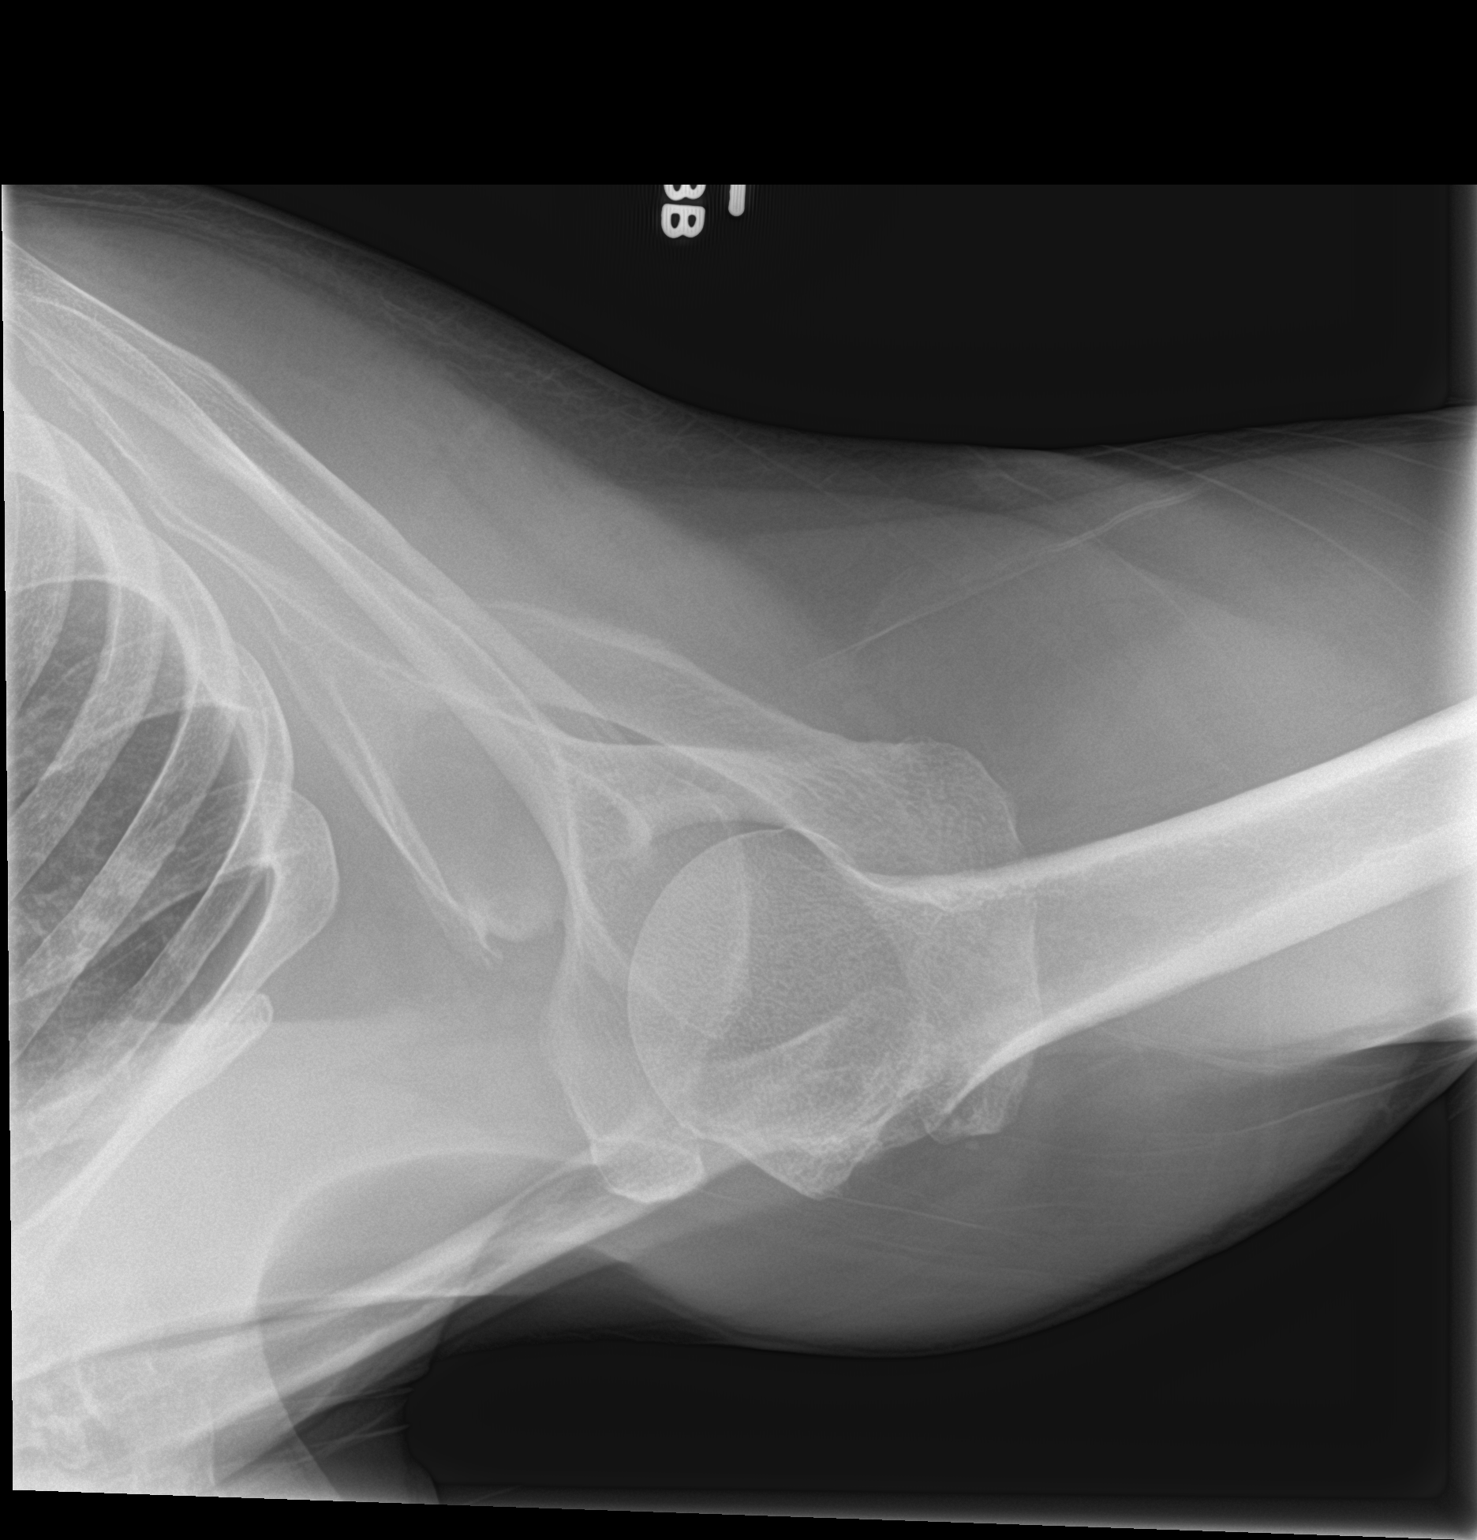
[im 2/3]
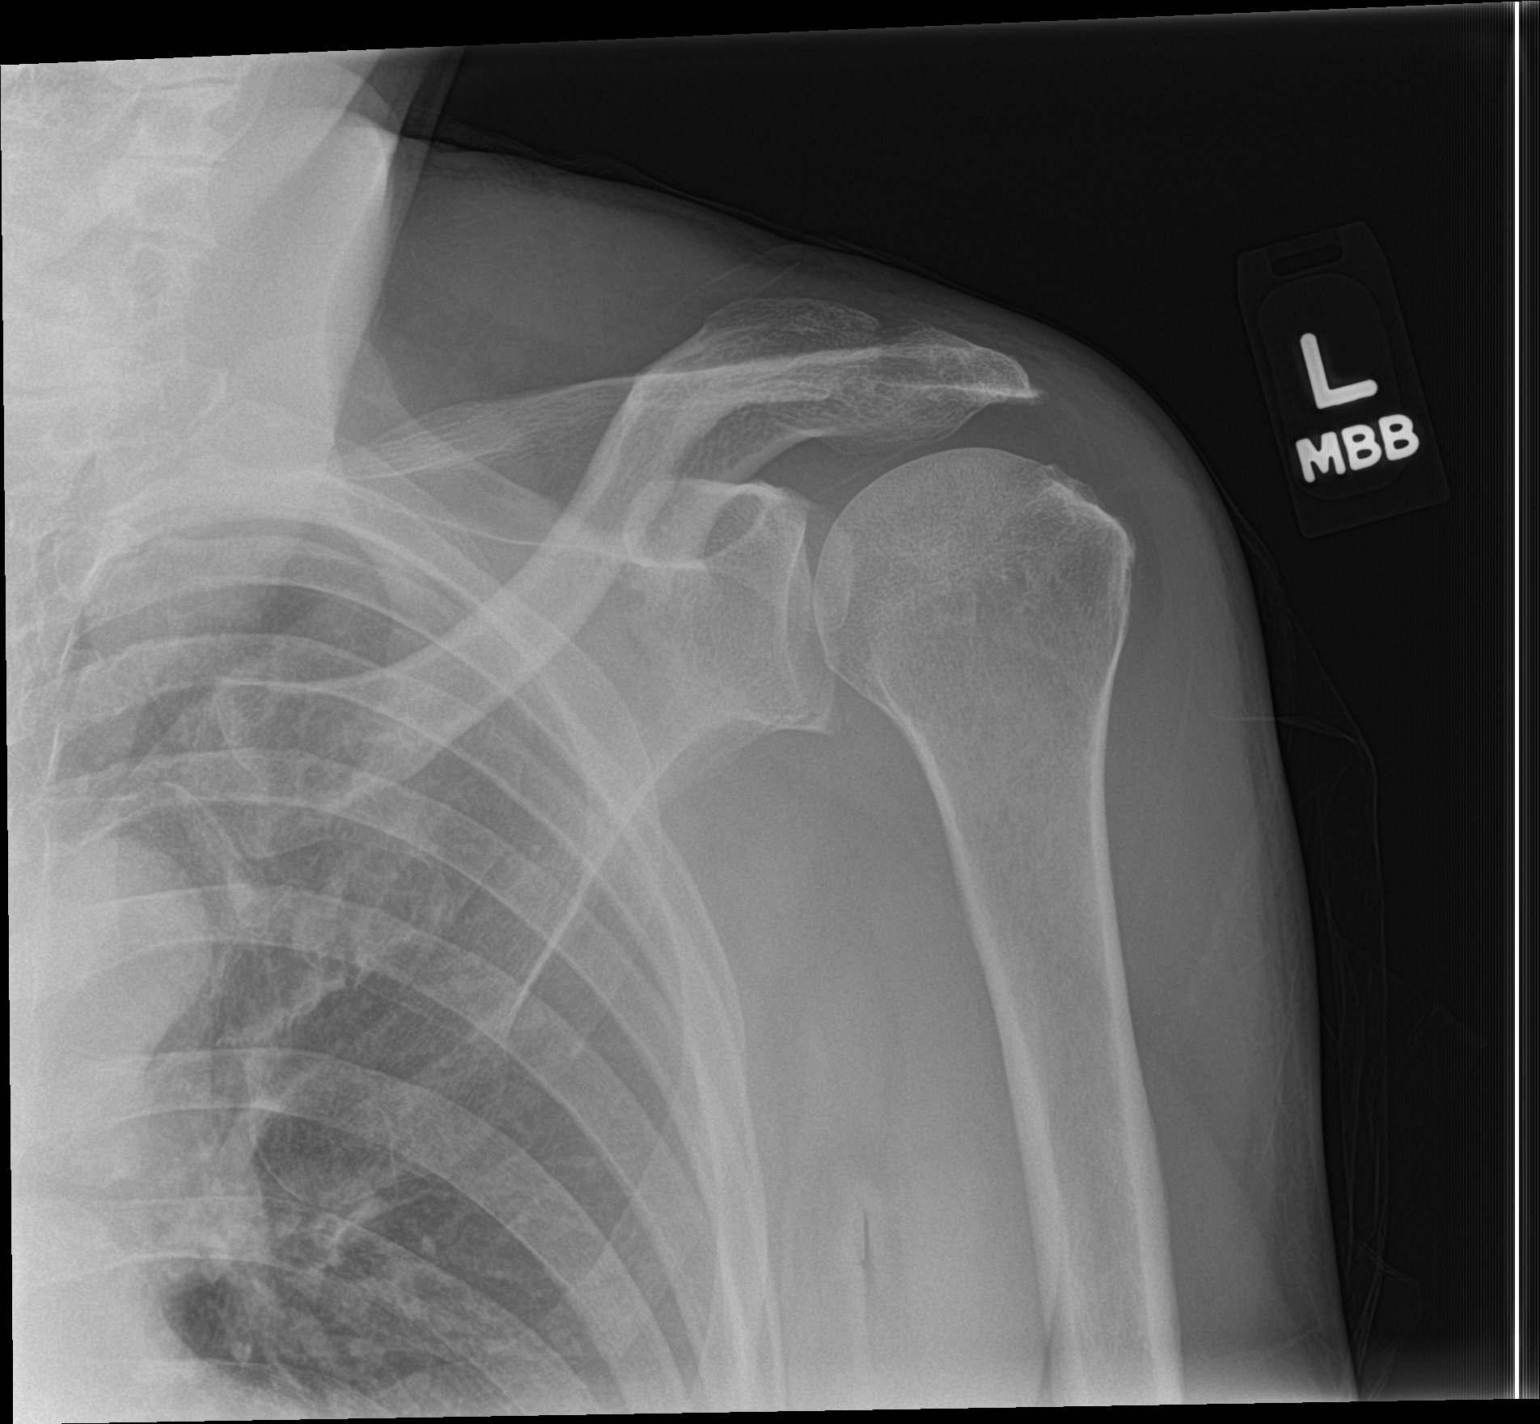
[im 3/3]
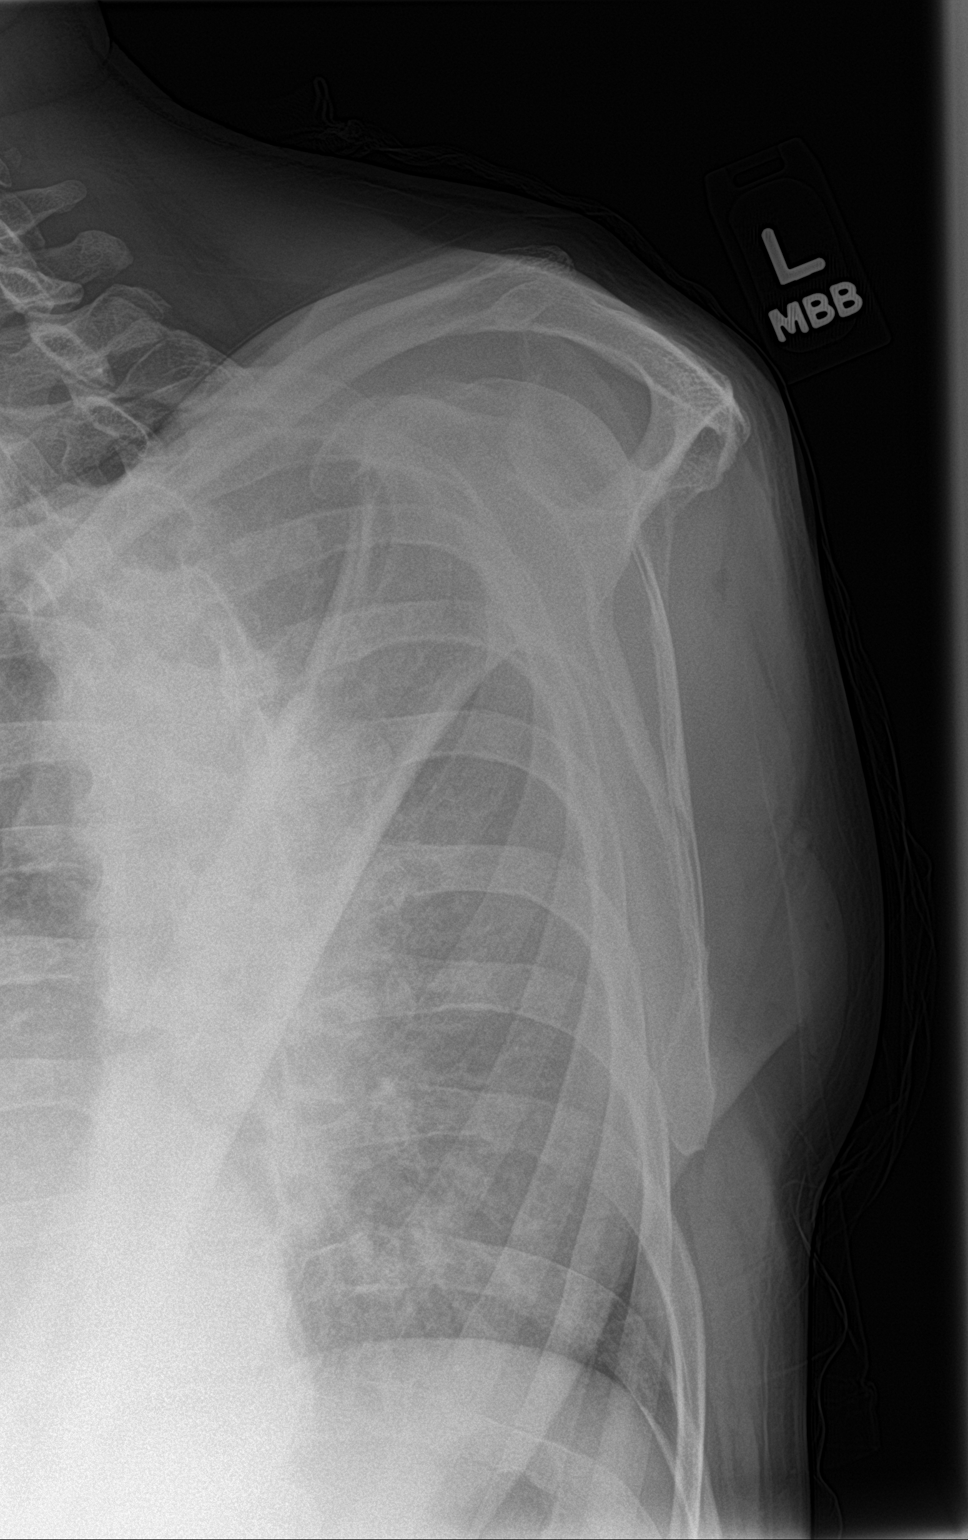

[3 of 3 positions shown; findings below may reference images not displayed]

FINDINGS: There is no evidence of fracture or dislocation. Glenohumeral joint
is unremarkable. Moderate degenerative changes seen involving the
left acromioclavicular joint. Soft tissues are unremarkable.
IMPRESSION: Moderate degenerative joint disease of the left acromioclavicular
joint. No acute abnormality seen in the left shoulder.

## 2017-10-18 MED ORDER — DICLOFENAC SODIUM 1 % TD GEL: 4.0000 g | Freq: Four times a day (QID) | TRANSDERMAL | 0 refills | Status: DC

## 2017-10-18 MED ORDER — ACETAMINOPHEN 325 MG PO TABS
650.0000 mg | ORAL_TABLET | Freq: Once | ORAL | Status: AC
  Administered 2017-10-18: 650 mg via ORAL
  Filled 2017-10-18: qty 2

## 2017-10-18 NOTE — Discharge Instructions (Signed)
Your shoulder xrays showed degenerative changes of the shoulder.  I have placed you in a shoulder sling for comfort. Please take your shoulder out of shoulder sling at least once per day and perform shoulder range of motion exercises in order to prevent frozen shoulder. Please take 1000 mg of Tylenol every 6 hours as needed for pain.  Please do not take more than 4000 mg of Tylenol in a 24-hour period. Do not drink while taking tylenol.  Follow up with the orthopedist provided.  If you develop worsening or new concerning symptoms you can return to the emergency department for re-evaluation.

## 2017-10-18 NOTE — ED Notes (Signed)
Patient verbalized understanding of discharge instructions and denies any further needs or questions at this time. VS stable. Patient ambulatory with steady gait.  

## 2017-10-18 NOTE — ED Provider Notes (Signed)
[facility] EMERGENCY DEPARTMENT Provider Note   CSN: [id number] Arrival date & time: 10/18/17  4098     History   Chief Complaint Chief Complaint  Patient presents with  . Shoulder Pain    HPI Tirso Laws is a 67 y.o. right handed male with a history of left shoulder problems, hypertension who presents emergency department today for left shoulder pain that began approximately 2 days ago.  Patient reports that he was lifting a heavy bucket of sand while at work approximately 2 days ago when he felt something pop in his left shoulder.  Since then he has been having pain that occurs whenever he Abducts to 90 degrees, flexes to 90 degrees with his shoulder.  Pain does not occur at rest.  He describes his pain as sharp, aggravating and worse with range of motion.  He denies any associated neck pain, visual changes, numbness, tingling, weakness, chest pain, shortness of breath.  The patient has been trying CBD oil for his symptoms without any relief. No fever, IV drug use, joint swelling, or skin changes.   HPI  Past Medical History:  Diagnosis Date  . Hypertension   . Shoulder problem    left shoulder    Patient Active Problem List   Diagnosis Date Noted  . Trigger thumb of right hand 09/22/2013  . Smoking 08/29/2013  . Elevated BP 08/29/2013  . Pain of right thumb 08/29/2013  . Right groin pain 09/30/2012  . Hematuria 09/30/2012  . Epididymal cyst 09/30/2012    Past Surgical History:  Procedure Laterality Date  . HERNIA REPAIR          Home Medications    Prior to Admission medications   Medication Sig Start Date End Date Taking? Authorizing Provider  Acetaminophen-Codeine (TYLENOL/CODEINE #3) 300-30 MG tablet Take 1 tablet by mouth every 8 (eight) hours as needed for pain. 09/22/17   Claiborne Rigg, NP  albuterol (PROVENTIL HFA;VENTOLIN HFA) 108 (90 Base) MCG/ACT inhaler Inhale 2 puffs into the lungs every 6 (six) hours as needed for wheezing or  shortness of breath. 09/22/17   Claiborne Rigg, NP  cyclobenzaprine (FLEXERIL) 10 MG tablet Take 1 tablet (10 mg total) by mouth 3 (three) times daily as needed for muscle spasms. 09/22/17   Claiborne Rigg, NP  losartan (COZAAR) 25 MG tablet Take 1 tablet (25 mg total) by mouth daily. 09/22/17   Claiborne Rigg, NP    Family History No family history on file.  Social History Social History   Tobacco Use  . Smoking status: Current Every Day Smoker    Packs/day: 0.50    Types: Cigarettes  . Smokeless tobacco: Never Used  Substance Use Topics  . Alcohol use: Yes    Comment: quart to 1.5 after work each day of beer  . Drug use: Yes    Types: Marijuana     Allergies   Aspirin; Ibuprofen; and Norvasc [amlodipine besylate]   Review of Systems Review of Systems  Constitutional: Negative for fever.  Eyes: Negative for visual disturbance.  Respiratory: Negative for chest tightness and shortness of breath.   Cardiovascular: Negative for chest pain and leg swelling.  Gastrointestinal: Negative for nausea and vomiting.  Musculoskeletal: Positive for arthralgias. Negative for joint swelling and neck pain.  Skin: Negative for color change and wound.  Neurological: Negative for weakness and numbness.  All other systems reviewed and are negative.    Physical Exam Updated Vital Signs BP (!) 148/94 (BP  Location: Right Arm)   Pulse 75   Temp 98.6 F (37 C) (Oral)   Resp 17   SpO2 100%   Physical Exam  Constitutional: He appears well-developed and well-nourished.  HENT:  Head: Normocephalic and atraumatic.  Right Ear: External ear normal.  Left Ear: External ear normal.  Nose: Nose normal.  Mouth/Throat: Uvula is midline, oropharynx is clear and moist and mucous membranes are normal. No tonsillar exudate.  Eyes: Pupils are equal, round, and reactive to light. Right eye exhibits no discharge. Left eye exhibits no discharge. No scleral icterus.  Neck: Trachea normal. Neck  supple. No spinous process tenderness present. No neck rigidity. Normal range of motion present.  Cardiovascular: Normal rate, regular rhythm and intact distal pulses.  No murmur heard. Pulses:      Radial pulses are 2+ on the right side, and 2+ on the left side.       Dorsalis pedis pulses are 2+ on the right side, and 2+ on the left side.       Posterior tibial pulses are 2+ on the right side, and 2+ on the left side.  No lower extremity swelling or edema. Calves symmetric in size bilaterally.  Pulmonary/Chest: Effort normal and breath sounds normal. He exhibits no tenderness.  Abdominal: Soft. Bowel sounds are normal. There is no tenderness. There is no rebound and no guarding.  Musculoskeletal: He exhibits no edema.  Cervical Spine: Appearance normal. No obvious bony deformity. No skin swelling, erythema, heat, fluctuance or break of the skin. No TTP over the cervical spinous processes. No paraspinal tenderness. No step-offs. Patient is able to actively rotate their neck 45 degrees left and right voluntarily without pain and flex and extend the neck without pain. Negative Spurling's Left Shoulder: Appearance normal. No obvious bony deformity. No skin swelling, erythema, heat, fluctuance or break of the skin. No clavicular deformity or TTP. TTP over lateral shoulder and AC joint.  Patient with active abduction and, flexion to 90 degrees and reports pain.  He is able to passively range to 180 degrees.  He has intact internal and external rotation.  Patient with reproduced and maximal pain with adduction and crossarm test.  No tenderness palpation over the bicipital groove and negative speeds test.  He has a negative Neer's and Hawkins test.  Negative drop arm test. Left Elbow: Appearance normal. No obvious bony deformity. No skin swelling, erythema, heat, fluctuance or break of the skin. No TTP over joint. Active flexion, extension, supination and pronation full and intact without pain. Strength able  and appropriate for age for flexion and extension.  Radial Pulse 2+. Cap refill <2 seconds. SILT for M/U/R distributions. Compartments soft.   Lymphadenopathy:    He has no cervical adenopathy.  Neurological: He is alert. He has normal strength. No sensory deficit.  Skin: Skin is warm, dry and intact. Capillary refill takes less than 2 seconds. No rash noted. Rash is not vesicular. He is not diaphoretic. No erythema.  Psychiatric: He has a normal mood and affect.  Nursing note and vitals reviewed.    ED Treatments / Results  Labs (all labs ordered are listed, but only abnormal results are displayed) Labs Reviewed - No data to display  EKG None  Radiology Dg Shoulder Left  Result Date: 10/18/2017 CLINICAL DATA:  Acute left shoulder pain after lifting injury several days ago. EXAM: LEFT SHOULDER - 2+ VIEW COMPARISON:  Radiographs of April 14, 2015. FINDINGS: There is no evidence of fracture or dislocation. Glenohumeral  joint is unremarkable. Moderate degenerative changes seen involving the left acromioclavicular joint. Soft tissues are unremarkable. 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 No acute abnormality seen in the left shoulder. Electronically Signed   By: Lupita RaiderJames  Green Jr, M.D.   On: 10/18/2017 09:43    Procedures Procedures (including critical care time)  Medications Ordered in ED Medications  acetaminophen (TYLENOL) tablet 650 mg (has no administration in time range)     Initial Impression / Assessment and Plan / ED Course  I have reviewed the triage vital signs and the nursing notes.  Pertinent labs & imaging results that were available during my care of the patient were reviewed by me and considered in my medical decision making (see chart for details).     67 y.o. male with left shoulder pain after moving sand.  No pain at rest.  He reports pain only with movement of his shoulder.  Pain is not brought on with exertion  and not associated with chest pain, shortness of breath, nausea, diaphoresis.  He denies any neck pain.  Patient is neurovascular intact.  Strength 5/5 bilaterally.  Compartments are soft.  X-rays with moderate degenerative joint disease of the left AC joint.  Patient with positive crossarm test.  He has a reassuring exam as above otherwise.  Will place in shoulder sling, treat conservatively and have follow-up with orthopedics.  Do not suspect septic joint or gout. Patient's pain treated in the department. Advised patient to take shoulder out of sling 1-2 times per day and perform shoulder range of motion exercises in order to prevent frozen shoulder. Return precautions were discussed.  Patient appears safe for discharge.  Final Clinical Impressions(s) / ED Diagnoses   Final diagnoses:  Acute pain of left shoulder    ED Discharge Orders         Ordered    diclofenac sodium (VOLTAREN) 1 % GEL  4 times daily     10/18/17 [address]1026           Gordana Kewley M, PA-C 10/18/17 1026    Gerhard MunchLockwood, Robert, MD 10/18/17 1625

## 2017-10-18 NOTE — ED Triage Notes (Signed)
Patient complains of left shoulder pain and injury after lifting heavy bucket of sand 2 days ago, pain with only ROM

## 2017-10-19 ENCOUNTER — Ambulatory Visit: Payer: Self-pay | Admitting: Pharmacist

## 2017-12-23 ENCOUNTER — Ambulatory Visit: Payer: Self-pay | Admitting: Nurse Practitioner

## 2018-12-14 ENCOUNTER — Other Ambulatory Visit: Payer: Self-pay

## 2018-12-14 ENCOUNTER — Emergency Department (HOSPITAL_COMMUNITY)
Admission: EM | Admit: 2018-12-14 | Discharge: 2018-12-14 | Disposition: A | Discharge status: Home / Self Care | Payer: Medicare Other | Attending: Emergency Medicine | Admitting: Emergency Medicine

## 2018-12-14 ENCOUNTER — Encounter (HOSPITAL_COMMUNITY): Payer: Self-pay | Admitting: *Deleted

## 2018-12-14 ENCOUNTER — Emergency Department (HOSPITAL_COMMUNITY): Payer: Medicare Other

## 2018-12-14 DIAGNOSIS — J069 Acute upper respiratory infection, unspecified: Secondary | ICD-10-CM

## 2018-12-14 DIAGNOSIS — R509 Fever, unspecified: Secondary | ICD-10-CM | POA: Diagnosis present

## 2018-12-14 DIAGNOSIS — F17210 Nicotine dependence, cigarettes, uncomplicated: Secondary | ICD-10-CM | POA: Insufficient documentation

## 2018-12-14 DIAGNOSIS — I10 Essential (primary) hypertension: Secondary | ICD-10-CM | POA: Diagnosis not present

## 2018-12-14 DIAGNOSIS — Z79899 Other long term (current) drug therapy: Secondary | ICD-10-CM | POA: Insufficient documentation

## 2018-12-14 DIAGNOSIS — Z20828 Contact with and (suspected) exposure to other viral communicable diseases: Secondary | ICD-10-CM | POA: Insufficient documentation

## 2018-12-14 IMAGING — CR DG CHEST 2V
2 series · 2 of 2 positions shown · non-contrast
Comparison: [DATE]

CLINICAL DATA: Cough

EXAM:
CHEST - 2 VIEW

[w chest lat]
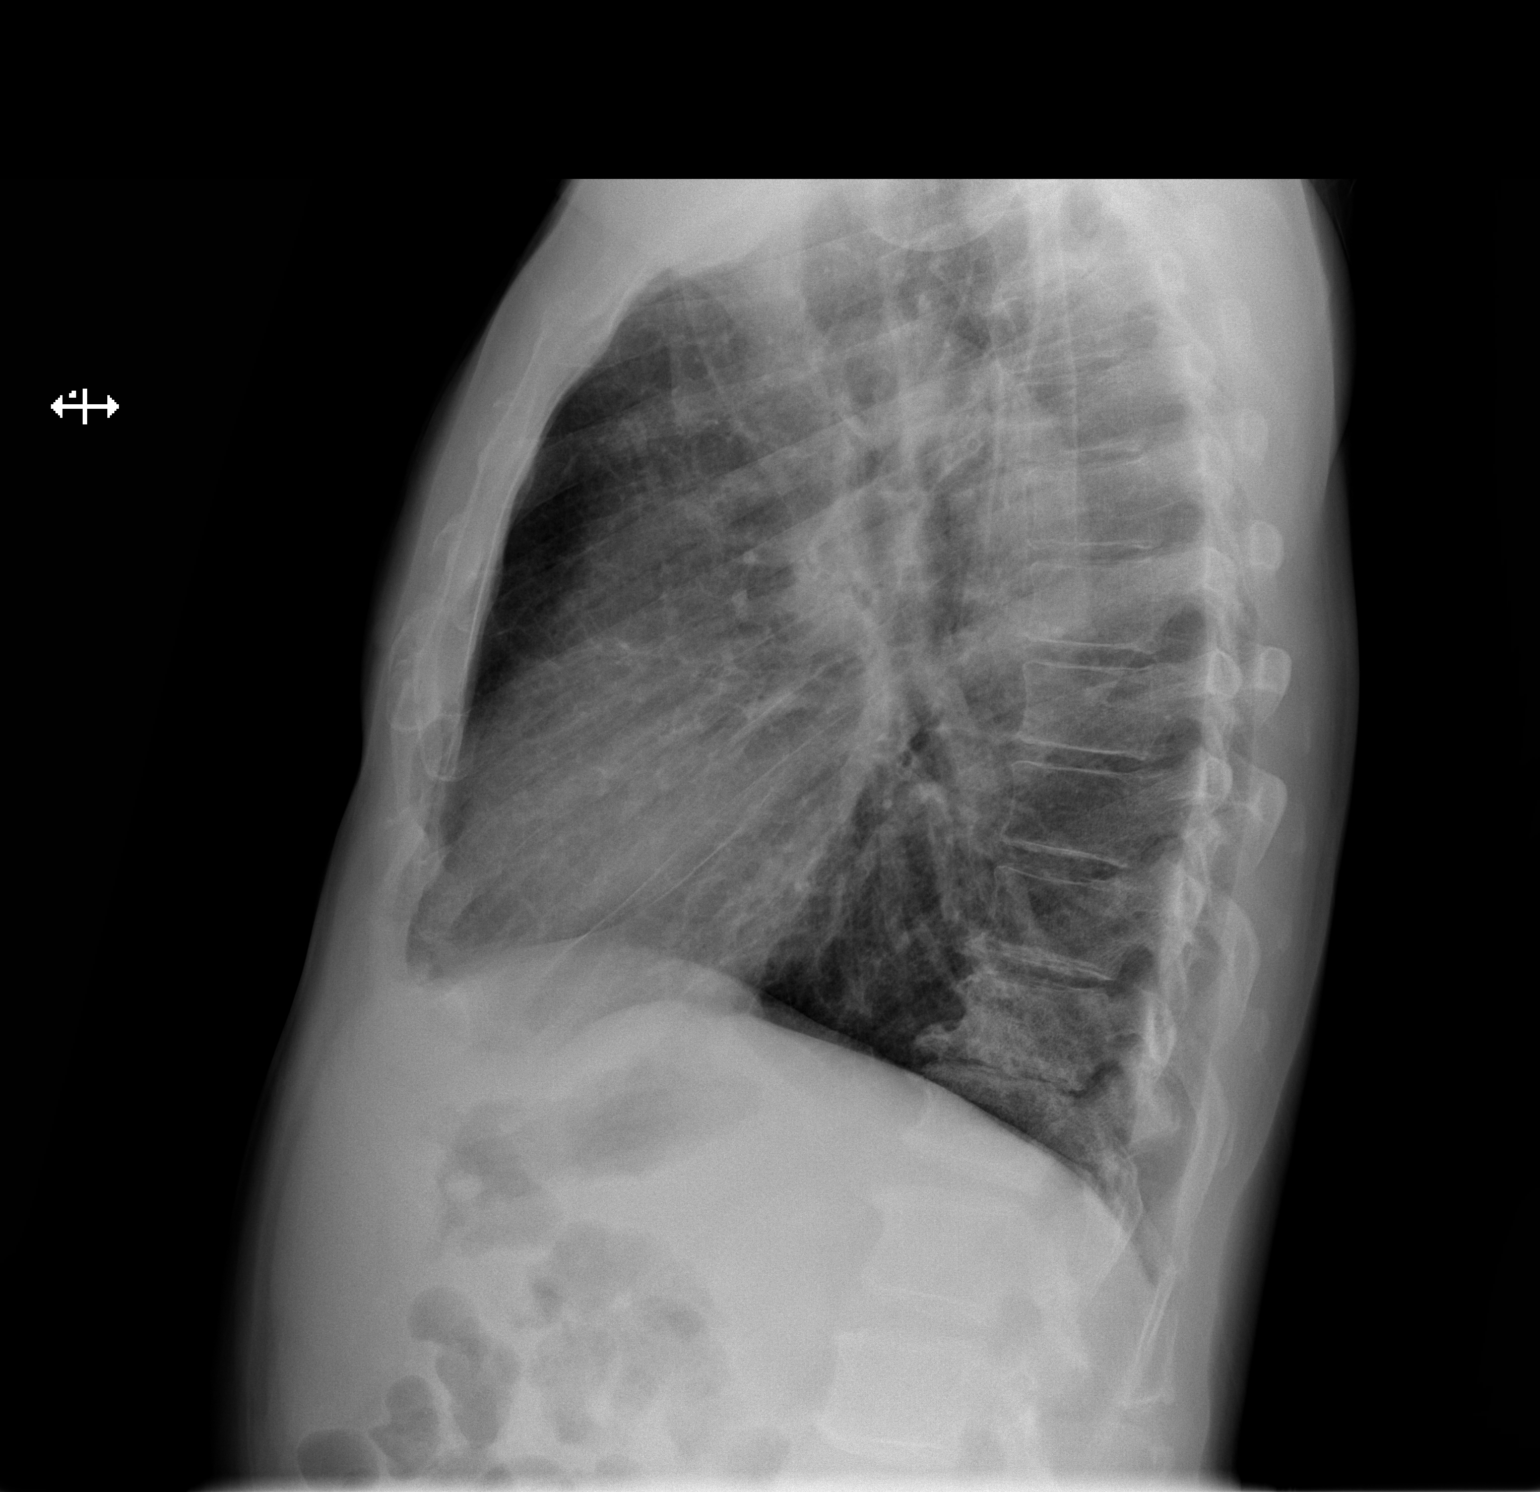

[w chest pa]
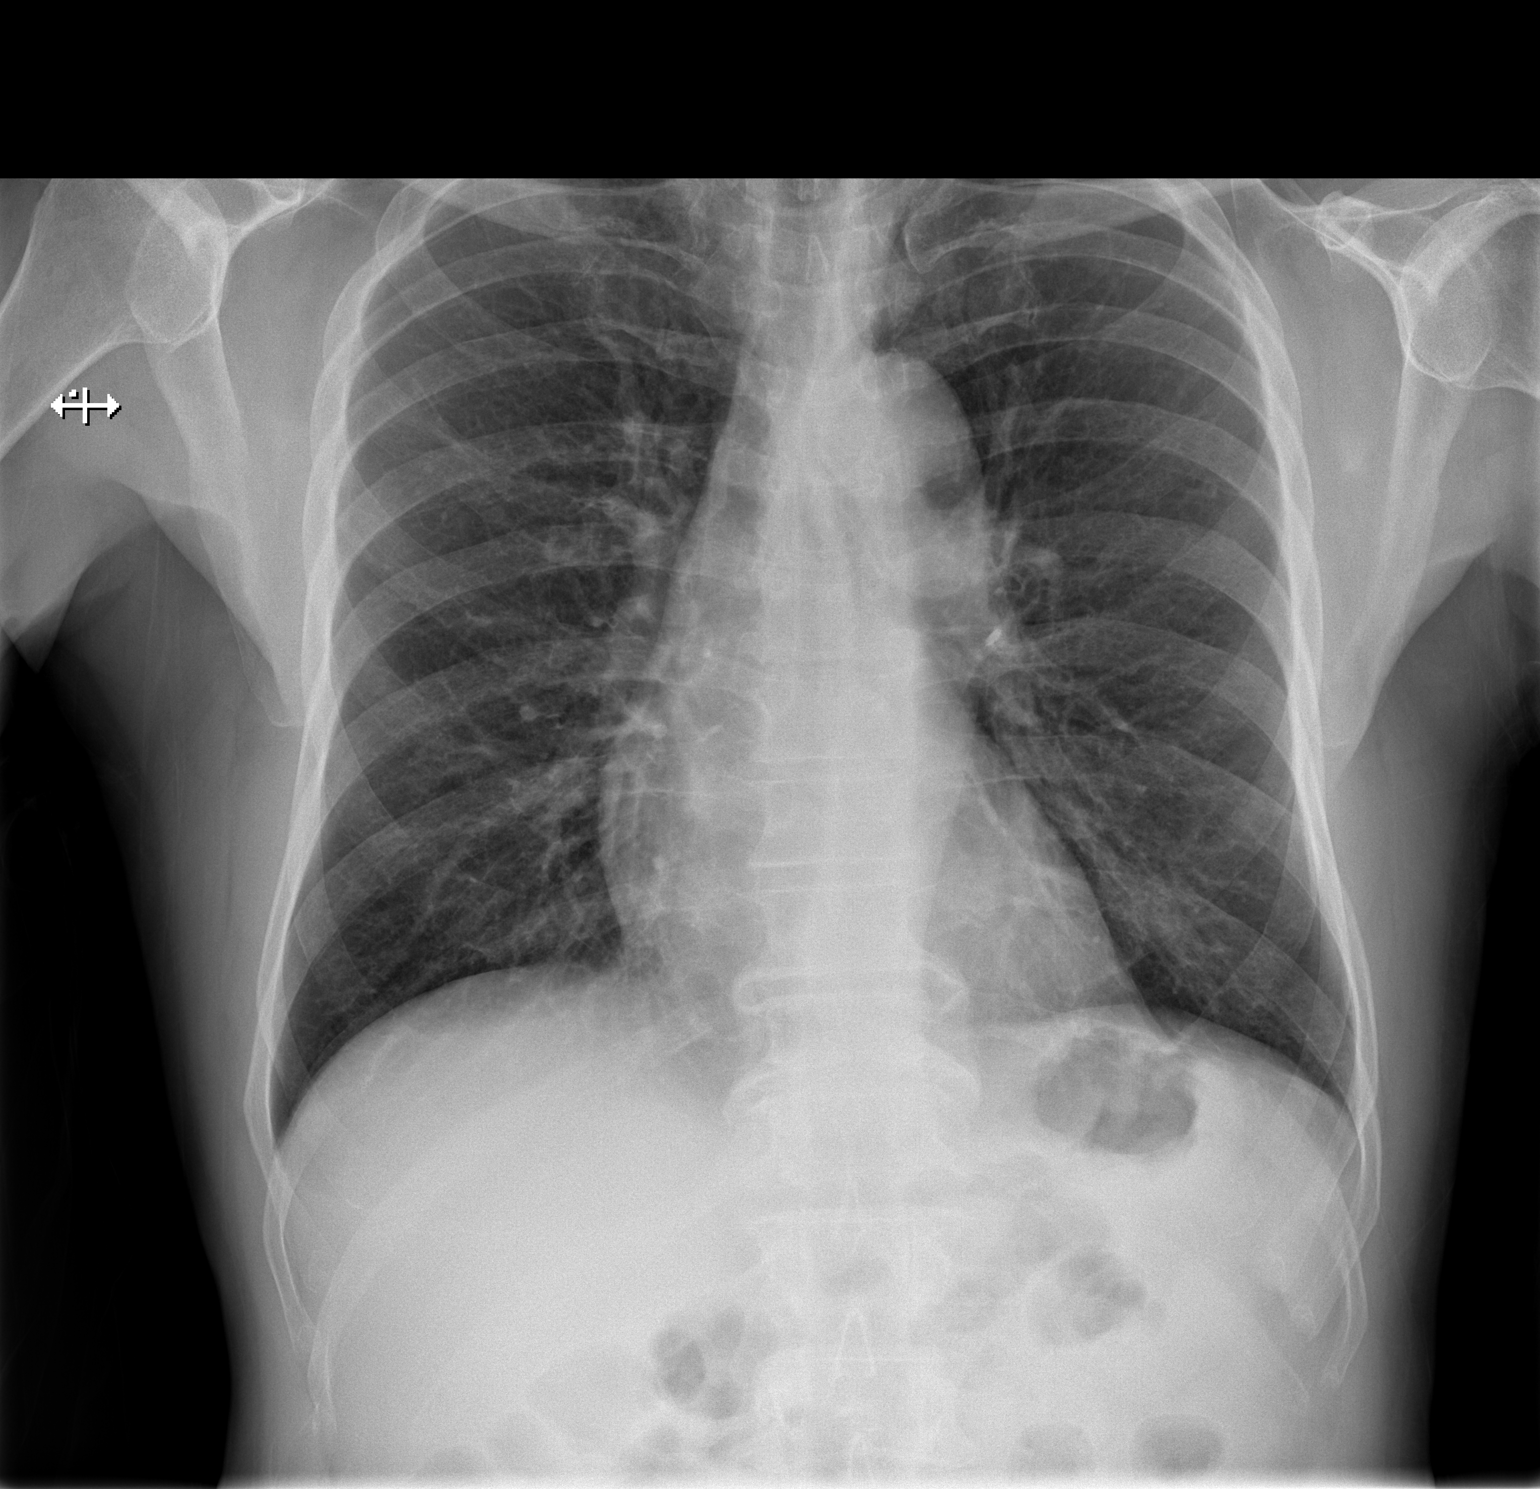

[2 of 2 positions shown; findings below may reference images not displayed]

FINDINGS: The heart size and mediastinal contours are within normal limits.
Both lungs are clear. Disc degenerative disease of the thoracic
spine.
IMPRESSION: No acute abnormality of the lungs.

## 2018-12-14 MED ORDER — BENZONATATE 100 MG PO CAPS: 100.0000 mg | ORAL_CAPSULE | Freq: Three times a day (TID) | ORAL | 0 refills | Status: AC

## 2018-12-14 MED ORDER — ACETAMINOPHEN 500 MG PO TABS: 500.0000 mg | ORAL_TABLET | Freq: Four times a day (QID) | ORAL | 0 refills | Status: DC | PRN

## 2018-12-14 MED ORDER — BENZONATATE 100 MG PO CAPS: 100.0000 mg | ORAL_CAPSULE | Freq: Three times a day (TID) | ORAL | 0 refills | Status: DC

## 2018-12-14 MED ORDER — PREDNISONE 20 MG PO TABS: 40.0000 mg | ORAL_TABLET | Freq: Every day | ORAL | 0 refills | Status: DC

## 2018-12-14 MED ORDER — PREDNISONE 20 MG PO TABS: 40.0000 mg | ORAL_TABLET | Freq: Every day | ORAL | 0 refills | Status: AC

## 2018-12-14 MED FILL — predniSONE 20 MG TABS: 20 | 5 days supply | Qty: 10 | Fill #0

## 2018-12-14 MED FILL — BENZONATATE 100 MG CAPS: 100 | 7 days supply | Qty: 21 | Fill #0

## 2018-12-14 NOTE — ED Provider Notes (Signed)
[facility]-EMERGENCY DEPT Provider Note   CSN: [id number] Arrival date & time: 12/14/18  1214     History   Chief Complaint Chief Complaint  Patient presents with  . Fever  . Cough  . Chills    HPI Kevin Maxwell is a 68 y.o. male with no significant past medical history presents to the ED with a 4-day history of flu-like symptoms.  He reports subjective fever, chills, wheezing, and cough productive of thick, yellow mucus.  He states that he was prescribed amoxicillin for a tooth infection approximately 1 month ago and he took some for his new onset cough, but with no relief.  He also endorses runny nose and congestion.  Patient reports chest discomfort when coughing and states that his cough is keeping him up at night.  He denies any headache, dizziness, exercise-induced chest pain, shortness of breath, abdominal pain, nausea, vomiting, urinary symptoms, or any other symptoms.      HPI  Past Medical History:  Diagnosis Date  . Hypertension   . Shoulder problem    left shoulder    Patient Active Problem List   Diagnosis Date Noted  . Trigger thumb of right hand 09/22/2013  . Smoking 08/29/2013  . Elevated BP 08/29/2013  . Pain of right thumb 08/29/2013  . Right groin pain 09/30/2012  . Hematuria 09/30/2012  . Epididymal cyst 09/30/2012    Past Surgical History:  Procedure Laterality Date  . HERNIA REPAIR          Home Medications    Prior to Admission medications   Medication Sig Start Date End Date Taking? Authorizing Provider  acetaminophen (TYLENOL) 500 MG tablet Take 1 tablet (500 mg total) by mouth every 6 (six) hours as needed. 12/14/18   Lorelee NewGreen, Kahlan Engebretson L, PA-C  Acetaminophen-Codeine (TYLENOL/CODEINE #3) 300-30 MG tablet Take 1 tablet by mouth every 8 (eight) hours as needed for pain. 09/22/17   Claiborne RiggFleming, Zelda W, NP  albuterol (PROVENTIL HFA;VENTOLIN HFA) 108 (90 Base) MCG/ACT inhaler Inhale 2 puffs into the lungs every 6 (six) hours as  needed for wheezing or shortness of breath. 09/22/17   Claiborne RiggFleming, Zelda W, NP  benzonatate (TESSALON) 100 MG capsule Take 1 capsule (100 mg total) by mouth every 8 (eight) hours. 12/14/18   Lorelee NewGreen, Deysy Schabel L, PA-C  cyclobenzaprine (FLEXERIL) 10 MG tablet Take 1 tablet (10 mg total) by mouth 3 (three) times daily as needed for muscle spasms. 09/22/17   Claiborne RiggFleming, Zelda W, NP  diclofenac sodium (VOLTAREN) 1 % GEL Apply 4 g topically 4 (four) times daily. 10/18/17   Maczis, Elmer SowMichael M, PA-C  losartan (COZAAR) 25 MG tablet Take 1 tablet (25 mg total) by mouth daily. 09/22/17   Claiborne RiggFleming, Zelda W, NP  predniSONE (DELTASONE) 20 MG tablet Take 2 tablets (40 mg total) by mouth daily with breakfast for 5 days. 12/14/18 12/19/18  Lorelee NewGreen, Cozetta Seif L, PA-C    Family History No family history on file.  Social History Social History   Tobacco Use  . Smoking status: Current Every Day Smoker    Packs/day: 0.50    Types: Cigarettes  . Smokeless tobacco: Never Used  Substance Use Topics  . Alcohol use: Yes    Comment: quart to 1.5 after work each day of beer  . Drug use: Yes    Types: Marijuana     Allergies   Aspirin, Ibuprofen, and Norvasc [amlodipine besylate]   Review of Systems Review of Systems  All other systems reviewed and are  negative.    Physical Exam Updated Vital Signs BP 122/86 (BP Location: Left Arm)   Pulse (!) 51   Temp 99 F (37.2 C) (Oral)   Resp 18   Ht 5\' 9"  (1.753 m)   Wt 72.6 kg   SpO2 97%   BMI 23.63 kg/m   Physical Exam Vitals signs and nursing note reviewed. Exam conducted with a chaperone present.  Constitutional:      Appearance: Normal appearance.  HENT:     Head: Normocephalic and atraumatic.  Eyes:     General: No scleral icterus.    Conjunctiva/sclera: Conjunctivae normal.  Neck:     Musculoskeletal: Normal range of motion. No neck rigidity.  Cardiovascular:     Rate and Rhythm: Normal rate and regular rhythm.     Pulses: Normal pulses.     Heart  sounds: Normal heart sounds.  Pulmonary:     Effort: Pulmonary effort is normal. No respiratory distress.     Breath sounds: Wheezing present.  Abdominal:     General: There is no distension.     Palpations: Abdomen is soft.     Tenderness: There is no abdominal tenderness. There is no guarding.  Musculoskeletal:     Comments: Chest discomfort reproducible with palpation.  Skin:    General: Skin is dry.  Neurological:     Mental Status: He is alert.     GCS: GCS eye subscore is 4. GCS verbal subscore is 5. GCS motor subscore is 6.  Psychiatric:        Mood and Affect: Mood normal.        Behavior: Behavior normal.        Thought Content: Thought content normal.      ED Treatments / Results  Labs (all labs ordered are listed, but only abnormal results are displayed) Labs Reviewed  NOVEL CORONAVIRUS, NAA (HOSP ORDER, SEND-OUT TO REF LAB; TAT 18-24 HRS)    EKG None  Radiology Dg Chest 2 View  Result Date: 12/14/2018 CLINICAL DATA:  Cough EXAM: CHEST - 2 VIEW COMPARISON:  12/12/2013 FINDINGS: The heart size and mediastinal contours are within normal limits. Both lungs are clear. Disc degenerative disease of the thoracic spine. IMPRESSION: No acute abnormality of the lungs. Electronically Signed   By: Eddie Candle M.D.   On: 12/14/2018 12:49    Procedures Procedures (including critical care time)  Medications Ordered in ED Medications - No data to display   Initial Impression / Assessment and Plan / ED Course  I have reviewed the triage vital signs and the nursing notes.  Pertinent labs & imaging results that were available during my care of the patient were reviewed by me and considered in my medical decision making (see chart for details).        Obtained chest x-ray which was reviewed and does not demonstrate any focal consolidation concerning for pneumonia.  Patient's history and physical exam is consistent with a viral upper respiratory infection.  Will obtain  send-out COVID-19 testing.  Significant wheezing bilaterally auscultated on lung exam.  However, no respiratory distress, increased work of breathing, or accessory muscle use.  Will treat with short course of prednisone in addition to symptomatic relief with Mucinex and antitussives.  He will experience his chest discomfort when he is coughing and it is reproducible with palpation.  Informed him that it is likely musculoskeletal and he agreed.  Prescribed Tessalon Perles which should help.  Patient's vital signs are normal and he is hemodynamically  stable.  No increased work of breathing and he is safe for discharge.  Do not suspect ACS or PE.  He endorses rhinorrhea at the same time he developed his productive cough and what he is experiencing is most likely a viral upper respiratory infection.  He does not have a thermometer at home and patient financial constraints, but encouraged him to take Tylenol should he develop fever or chills.  Also encouraged him to return to the ED or seek medical attention should he develop any worsening shortness of breath symptoms, new or different chest pain, dizziness, or any other new or worsening symptoms.  All of the evaluation and work-up results were discussed with the patient and any family at bedside. They were provided opportunity to ask any additional questions and have none at this time. They have expressed understanding of verbal discharge instructions as well as return precautions and are agreeable to the plan.    Final Clinical Impressions(s) / ED Diagnoses   Final diagnoses:  Viral upper respiratory tract infection    ED Discharge Orders         Ordered    benzonatate (TESSALON) 100 MG capsule  Every 8 hours     12/14/18 1346    predniSONE (DELTASONE) 20 MG tablet  Daily with breakfast     12/14/18 1346    acetaminophen (TYLENOL) 500 MG tablet  Every 6 hours PRN     12/14/18 1346           Lorelee New, PA-C 12/14/18 1421     Arby Barrette, MD 12/15/18 [phone]

## 2018-12-14 NOTE — Discharge Instructions (Signed)
Please review the attachment on upper respiratory infections.  Please take your medications, as prescribed.  Please follow-up with Dr. Christean Grief as needed.  Return to the ED or seek medical attention should you develop any worsening shortness of breath, new or different chest pain, dizziness, or any other new or worsening symptoms.

## 2018-12-14 NOTE — Progress Notes (Signed)
TOC CM contacted Kevin Maxwell and his meds are $16. Attempted call to pt and left HIPAA compliant message on voice mail. EDP, Krista Blue PA updated. [place], [place] ED TOC CM [phone]

## 2018-12-14 NOTE — ED Triage Notes (Addendum)
Pt states symptoms started last Friday. Started with cold symptoms, productive cough, fever and chills. Chest tenderness from coughing

## 2018-12-15 LAB — NOVEL CORONAVIRUS, NAA (HOSP ORDER, SEND-OUT TO REF LAB; TAT 18-24 HRS): SARS-CoV-2, NAA: NOT DETECTED

## 2020-09-24 ENCOUNTER — Emergency Department (HOSPITAL_COMMUNITY)
Admission: EM | Admit: 2020-09-24 | Discharge: 2020-09-24 | Disposition: A | Discharge status: Home / Self Care | Payer: Medicare HMO | Attending: Emergency Medicine | Admitting: Emergency Medicine

## 2020-09-24 ENCOUNTER — Encounter (HOSPITAL_COMMUNITY): Payer: Self-pay

## 2020-09-24 ENCOUNTER — Other Ambulatory Visit: Payer: Self-pay

## 2020-09-24 ENCOUNTER — Emergency Department (HOSPITAL_COMMUNITY): Payer: Medicare HMO

## 2020-09-24 DIAGNOSIS — I10 Essential (primary) hypertension: Secondary | ICD-10-CM | POA: Insufficient documentation

## 2020-09-24 DIAGNOSIS — S3992XA Unspecified injury of lower back, initial encounter: Secondary | ICD-10-CM | POA: Diagnosis present

## 2020-09-24 DIAGNOSIS — Z79899 Other long term (current) drug therapy: Secondary | ICD-10-CM | POA: Insufficient documentation

## 2020-09-24 DIAGNOSIS — M25512 Pain in left shoulder: Secondary | ICD-10-CM | POA: Insufficient documentation

## 2020-09-24 DIAGNOSIS — F17210 Nicotine dependence, cigarettes, uncomplicated: Secondary | ICD-10-CM | POA: Diagnosis not present

## 2020-09-24 DIAGNOSIS — S39012A Strain of muscle, fascia and tendon of lower back, initial encounter: Secondary | ICD-10-CM | POA: Insufficient documentation

## 2020-09-24 DIAGNOSIS — X500XXA Overexertion from strenuous movement or load, initial encounter: Secondary | ICD-10-CM | POA: Insufficient documentation

## 2020-09-24 IMAGING — CR DG LUMBAR SPINE COMPLETE 4+V
5 series · 5 of 5 positions shown · non-contrast
Comparison: [DATE].

CLINICAL DATA: Left lower back pain that radiates to the mid back
and up to the shoulder since yesterday, no specific injury.

EXAM:
LUMBAR SPINE - COMPLETE 4+ VIEW

[t lumbar spine ap]
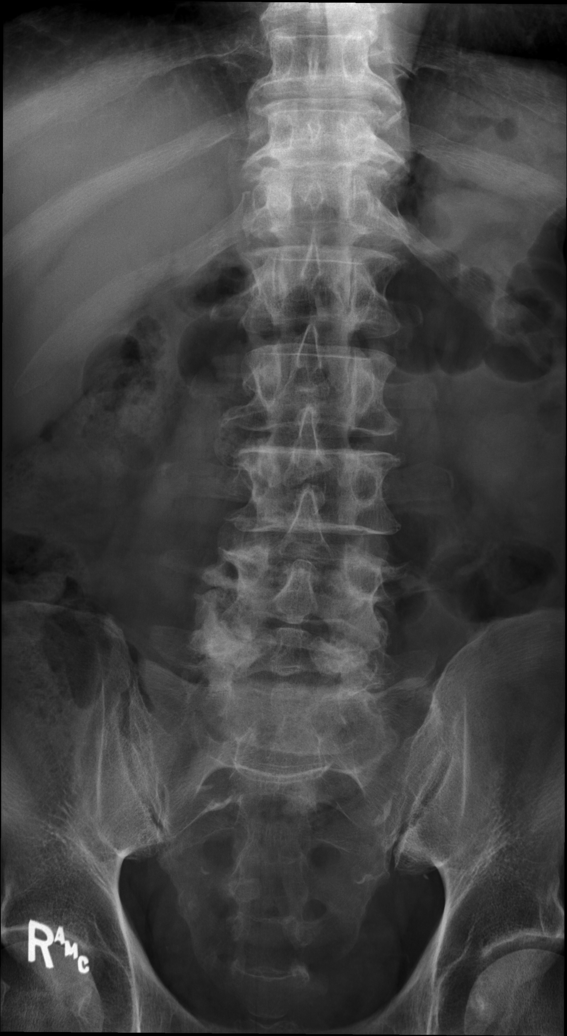

[t lumbar spine obl (1 of 2)]
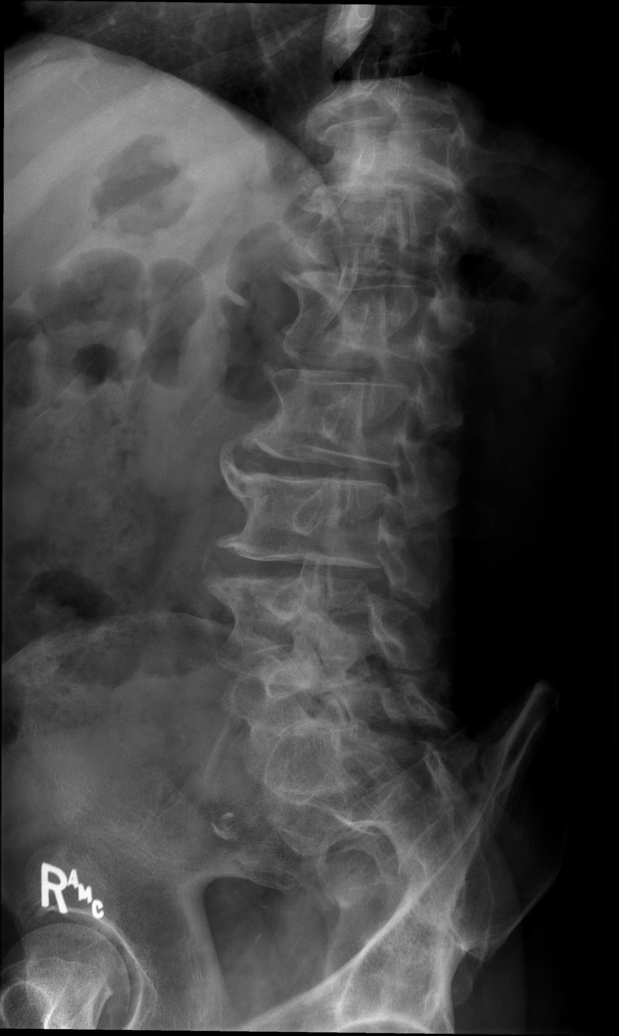

[t lumbar spine obl (2 of 2)]
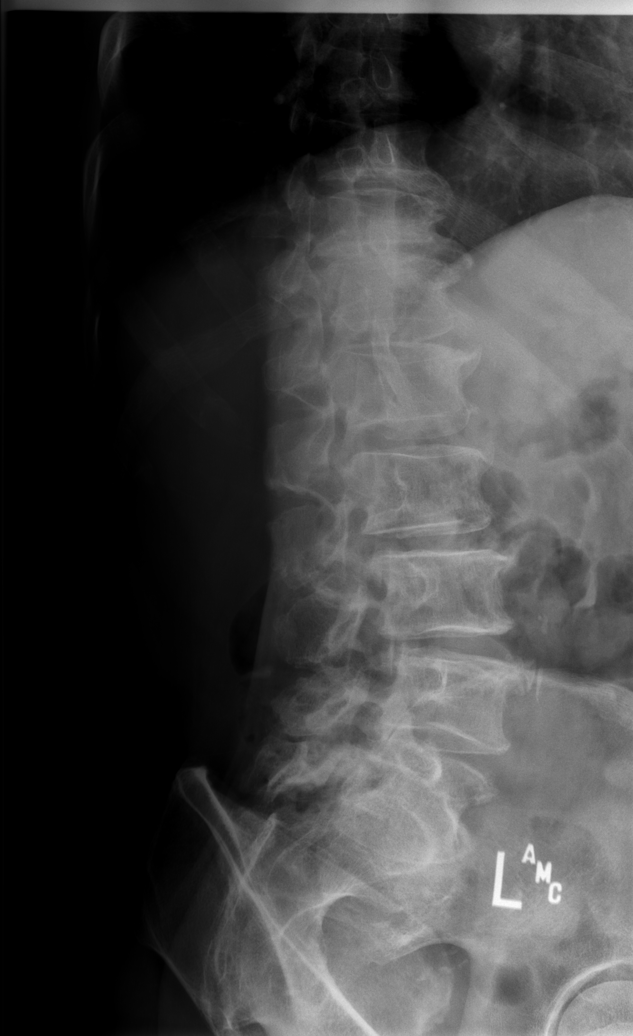

[t lumbar spine lat]
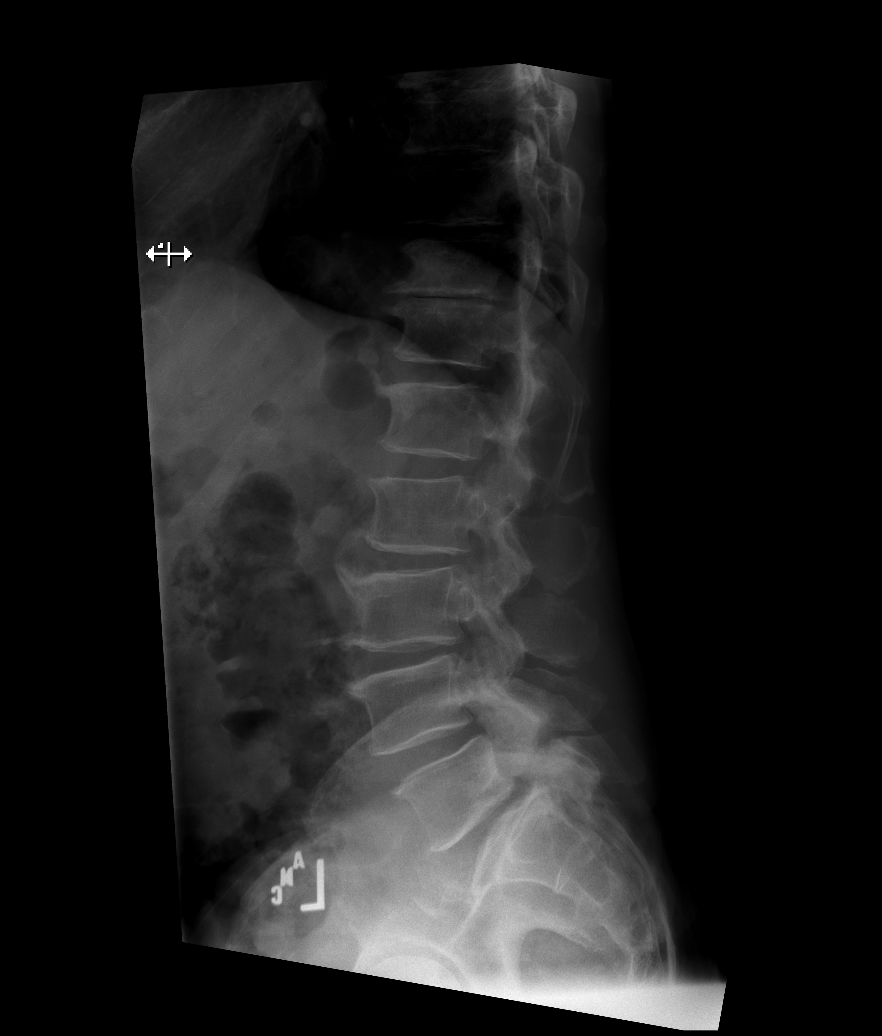

[t lumbar l-5 s-1 spot]
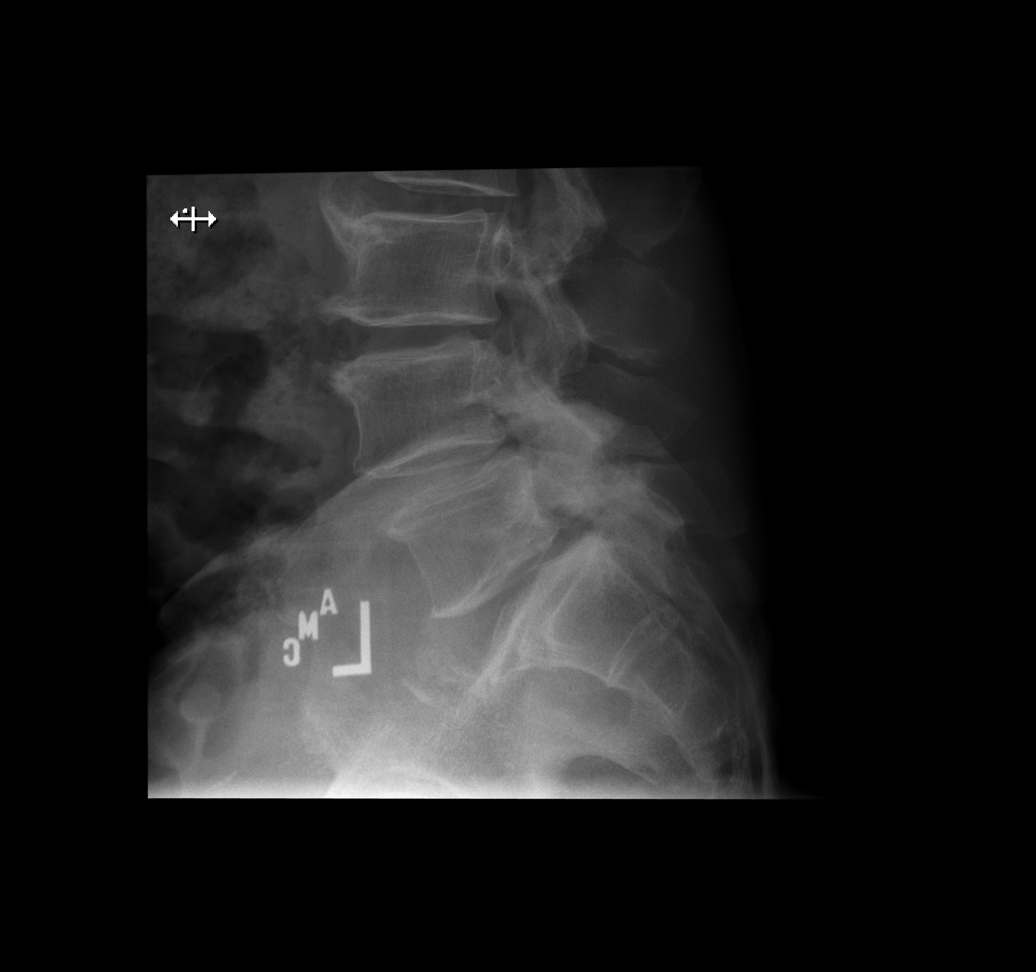

[5 of 5 positions shown; findings below may reference images not displayed]

FINDINGS: Alignment is anatomic. Vertebral body height is maintained. Mild
multilevel endplate degenerative changes. Prominent osteophytosis
anteriorly at L2-3 and L3-4. Slight loss of disc space height at
L5-S1. Facet hypertrophy at L4-5 and L5-S1. Incidental imaging of
the lower thoracic spine shows advanced degenerative disc disease at
T11-12. No definite pars defects.
IMPRESSION: Multilevel degenerative disc disease, worst at T11-12 and L5-S1.

## 2020-09-24 MED ORDER — DICLOFENAC SODIUM 1 % TD GEL: 4.0000 g | Freq: Four times a day (QID) | TRANSDERMAL | 0 refills | Status: AC

## 2020-09-24 MED ORDER — ACETAMINOPHEN 500 MG PO TABS: 1000.0000 mg | ORAL_TABLET | Freq: Four times a day (QID) | ORAL | 0 refills | Status: AC | PRN

## 2020-09-24 MED ORDER — TRAMADOL HCL 50 MG PO TABS
50.0000 mg | ORAL_TABLET | Freq: Once | ORAL | Status: AC
  Administered 2020-09-24: 50 mg via ORAL
  Filled 2020-09-24: qty 1
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
## 2020-09-24 NOTE — Discharge Instructions (Addendum)
Xray of your back shows evidence of degenerative disc disease.  This is likely contributing to your pain.  Take apply voltaren gel to affected area for comfort.  Take tylenol as needed for pain.  Follow up with your doctor for further care.

## 2020-09-24 NOTE — ED Provider Notes (Signed)
[facility]-EMERGENCY DEPT Provider Note   CSN: [id number] Arrival date & time: 09/24/20  1950     History Chief Complaint  Patient presents with   Back Pain   Shoulder Pain    Kevin Maxwell is a 70 y.o. male.  The history is provided by the patient and medical records. No language interpreter was used.  Back Pain Associated symptoms: no fever and no numbness   Shoulder Pain Associated symptoms: back pain   Associated symptoms: no fever     70 year old male with history of recurrent left shoulder pain who presents complaining of low back pain and shoulder pain.  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  He felt fine afterward however yesterday he started noticing pain to his lower back.  Pain is sharp with occasional muscle spasm intense, worse with movement.  Pain is nonradiating.  No associated bowel bladder incontinence or saddle anesthesia no trouble urinating and no leg pain.  He did try taking Flexeril that he was previously prescribed but it provided no relief.  Furthermore he also report he injured his left shoulder more than a year ago and has had occasional pain to the shoulder worse when he left his arm.  He has never been been managed for this complaint and denies worsening shoulder pain.  No numbness tingling sensation.  Patient unable to tolerate NSAIDs.  At this time his pain is moderate in severity.  No history of IV drug use active cancer      Past Medical History:  Diagnosis Date   Hypertension    Shoulder problem    left shoulder    Patient Active Problem List   Diagnosis Date Noted   Trigger thumb of right hand 09/22/2013   Smoking 08/29/2013   Elevated BP 08/29/2013   Pain of right thumb 08/29/2013   Right groin pain 09/30/2012   Hematuria 09/30/2012   Epididymal cyst 09/30/2012    Past Surgical History:  Procedure Laterality Date   HERNIA REPAIR         Family  History  Problem Relation Age of Onset   Hypertension Mother     Social History   Tobacco Use   Smoking status: Every Day    Packs/day: 0.25    Types: Cigarettes   Smokeless tobacco: Never  Vaping Use   Vaping Use: Never used  Substance Use Topics   Alcohol use: Yes   Drug use: Yes    Types: Marijuana    Home Medications Prior to Admission medications   Medication Sig Start Date End Date Taking? Authorizing Provider  acetaminophen (TYLENOL) 500 MG tablet Take 1 tablet (500 mg total) by mouth every 6 (six) hours as needed. 12/14/18   Lorelee New, PA-C  Acetaminophen-Codeine (TYLENOL/CODEINE #3) 300-30 MG tablet Take 1 tablet by mouth every 8 (eight) hours as needed for pain. 09/22/17   Claiborne Rigg, NP  albuterol (PROVENTIL HFA;VENTOLIN HFA) 108 (90 Base) MCG/ACT inhaler Inhale 2 puffs into the lungs every 6 (six) hours as needed for wheezing or shortness of breath. 09/22/17   Claiborne Rigg, NP  benzonatate (TESSALON) 100 MG capsule Take 1 capsule (100 mg total) by mouth every 8 (eight) hours. 12/14/18   Lorelee New, PA-C  cyclobenzaprine (FLEXERIL) 10 MG tablet Take 1 tablet (10 mg total) by mouth 3 (three) times daily as needed for muscle spasms. 09/22/17   Bertram Denver  W, NP  diclofenac sodium (VOLTAREN) 1 % GEL Apply 4 g topically 4 (four) times daily. 10/18/17   Maczis, Elmer Sow, PA-C  losartan (COZAAR) 25 MG tablet Take 1 tablet (25 mg total) by mouth daily. 09/22/17   Claiborne Rigg, NP    Allergies    Aspirin, Ibuprofen, and Norvasc [amlodipine besylate]  Review of Systems   Review of Systems  Constitutional:  Negative for fever.  Musculoskeletal:  Positive for back pain.  Skin:  Negative for wound.  Neurological:  Negative for numbness.   Physical Exam Updated Vital Signs BP 119/89 (BP Location: Left Arm)   Pulse 88   Temp 98.1 F (36.7 C) (Oral)   Resp 14   Ht 5\' 9"  (1.753 m)   Wt 75.8 kg   SpO2 100%   BMI 24.66 kg/m   Physical  Exam Vitals and nursing note reviewed.  Constitutional:      General: He is not in acute distress.    Appearance: He is well-developed.  HENT:     Head: Atraumatic.  Eyes:     Conjunctiva/sclera: Conjunctivae normal.  Abdominal:     Palpations: Abdomen is soft.     Tenderness: There is no abdominal tenderness.  Musculoskeletal:        General: Tenderness (Point tenderness to lumbar spine at level of L4-L5 without any crepitus or step-off.) present.     Cervical back: Neck supple.     Comments: Able to ambulate.  Skin:    Capillary Refill: Capillary refill takes less than 2 seconds.     Findings: No rash.  Neurological:     Mental Status: He is alert.    ED Results / Procedures / Treatments   Labs (all labs ordered are listed, but only abnormal results are displayed) Labs Reviewed - No data to display  EKG None  Radiology DG Lumbar Spine Complete  Result Date: 09/24/2020 CLINICAL DATA:  Left lower back pain that radiates to the mid back and up to the shoulder since yesterday, no specific injury. EXAM: LUMBAR SPINE - COMPLETE 4+ VIEW COMPARISON:  09/24/2020. FINDINGS: Alignment is anatomic. Vertebral body height is maintained. Mild multilevel endplate degenerative changes. Prominent osteophytosis anteriorly at L2-3 and L3-4. Slight loss of disc space height at L5-S1. Facet hypertrophy at L4-5 and L5-S1. Incidental imaging of the lower thoracic spine shows advanced degenerative disc disease at T11-12. No definite pars defects. IMPRESSION: Multilevel degenerative disc disease, worst at T11-12 and L5-S1. Electronically Signed   By: 11/24/2020 M.D.   On: 09/24/2020 11:32    Procedures Procedures   Medications Ordered in ED Medications  traMADol (ULTRAM) tablet 50 mg (50 mg Oral Given 09/24/20 1028)    ED Course  I have reviewed the triage vital signs and the nursing notes.  Pertinent labs & imaging results that were available during my care of the patient were reviewed by  me and considered in my medical decision making (see chart for details).    MDM Rules/Calculators/A&P                           BP (!) 145/106   Pulse 82   Temp 98.1 F (36.7 C) (Oral)   Resp 14   Ht 5\' 9"  (1.753 m)   Wt 75.8 kg   SpO2 100%   BMI 24.66 kg/m   Final Clinical Impression(s) / ED Diagnoses Final diagnoses:  Strain of lumbar region, initial encounter  Rx / DC Orders ED Discharge Orders          Ordered    diclofenac sodium (VOLTAREN) 1 % GEL  4 times daily        09/24/20 1336    acetaminophen (TYLENOL) 500 MG tablet  Every 6 hours PRN        09/24/20 1336           10:11 AM Patient here with low back pain likely MSK.  He report heavy lifting several days prior.  No red flags.  Pain is nonradicular.  Able to ambulate.  Given age, will obtain L-spine x-ray.  Pain medication given.  Care discussed with DR. Wright  L shoulder pain is chronic in nature, doubt dislocation.  Suspect rotator cuff injury causing recurrent pain, chronic.    1:34 PM Xray of Lspine showing multilevel DDD.  Will provide sxs treatment.  Outpt f/u recommended.  Return precaution given.    Fayrene Helper, PA-C 09/24/20 1338    Koleen Distance, MD 09/24/20 1520

## 2020-11-19 ENCOUNTER — Ambulatory Visit: Payer: Medicare HMO | Admitting: Physician Assistant

## 2020-11-19 ENCOUNTER — Other Ambulatory Visit: Payer: Self-pay

## 2020-11-19 VITALS — BP 149/98 | HR 91 | Temp 98.2°F | Resp 18 | Ht 69.0 in | Wt 161.0 lb

## 2020-11-19 DIAGNOSIS — G8929 Other chronic pain: Secondary | ICD-10-CM

## 2020-11-19 DIAGNOSIS — H9313 Tinnitus, bilateral: Secondary | ICD-10-CM | POA: Diagnosis not present

## 2020-11-19 DIAGNOSIS — M545 Low back pain, unspecified: Secondary | ICD-10-CM

## 2020-11-19 DIAGNOSIS — I10 Essential (primary) hypertension: Secondary | ICD-10-CM | POA: Diagnosis not present

## 2020-11-19 DIAGNOSIS — F1010 Alcohol abuse, uncomplicated: Secondary | ICD-10-CM

## 2020-11-19 DIAGNOSIS — Z6823 Body mass index (BMI) 23.0-23.9, adult: Secondary | ICD-10-CM

## 2020-11-19 MED ORDER — LOSARTAN POTASSIUM 25 MG PO TABS: 25.0000 mg | ORAL_TABLET | Freq: Every day | ORAL | 1 refills | Status: AC

## 2020-11-19 NOTE — Progress Notes (Signed)
Established Patient Office Visit  Subjective:  Patient ID: Kevin Maxwell, male    DOB: [birth year]  Age: 70 y.o. MRN: [id number]  CC:  Chief Complaint  Patient presents with   Tinnitus    HPI Kevin Maxwell reports that he has been having ringing in both of his ears for the past 3 weeks, states it is high-pitched, constant, making it more difficult for him to hear.  States that the noise is worse at night, states that during the day he can turn the TV up and it seems to make the noise go away.  Denies ear pain.  Denies NSAID use.  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  Reports that he is not taking his blood pressure medication, states that he does check it at home on occasion and it is similar to today's reading.  Reports that he does drink 2 quarts of beer on a daily basis, states that he does have a history of back pain and the only thing that relieves it is drinking beer at night.  Reports that he does daily stretching without relief, states that he has previously been given muscle relaxers without relief.  Reports that he smokes approximately 8 cigarettes a day, does endorse drinking "lots of caffeine throughout the day"  Past Medical History:  Diagnosis Date   Hypertension    Shoulder problem    left shoulder    Past Surgical History:  Procedure Laterality Date   HERNIA REPAIR      Family History  Problem Relation Age of Onset   Hypertension Mother     Social History   Socioeconomic History   Marital status: Married    Spouse name: Not on file   Number of children: Not on file   Years of education: Not on file   Highest education level: Not on file  Occupational History   Not on file  Tobacco Use   Smoking status: Every Day    Packs/day: 0.25    Types: Cigarettes   Smokeless tobacco: Never  Vaping Use   Vaping Use: Never used  Substance and Sexual Activity    Alcohol use: Yes    Comment: beer   Drug use: Yes    Types: Marijuana   Sexual activity: Yes  Other Topics Concern   Not on file  Social History Narrative   Not on file   Social Determinants of Health   Financial Resource Strain: Not on file  Food Insecurity: Not on file  Transportation Needs: Not on file  Physical Activity: Not on file  Stress: Not on file  Social Connections: Not on file  Intimate Partner Violence: Not on file    Outpatient Medications Prior to Visit  Medication Sig Dispense Refill   acetaminophen (TYLENOL) 500 MG tablet Take 2 tablets (1,000 mg total) by mouth every 6 (six) hours as needed for moderate pain. 30 tablet 0   albuterol (PROVENTIL HFA;VENTOLIN HFA) 108 (90 Base) MCG/ACT inhaler Inhale 2 puffs into the lungs every 6 (six) hours as needed for wheezing or shortness of breath. 1 Inhaler 2   cyclobenzaprine (FLEXERIL) 10 MG tablet Take 1 tablet (10 mg total) by mouth 3 (three) times daily as needed for muscle spasms. 30 tablet 1   diclofenac sodium (VOLTAREN) 1 % GEL Apply 4 g topically 4 (four) times daily. 100 g 0   losartan (COZAAR) 25  MG tablet Take 1 tablet (25 mg total) by mouth daily. 90 tablet 1   benzonatate (TESSALON) 100 MG capsule Take 1 capsule (100 mg total) by mouth every 8 (eight) hours. (Patient not taking: Reported on 11/19/2020) 21 capsule 0   No facility-administered medications prior to visit.    Allergies  Allergen Reactions   Aspirin Nausea And Vomiting   Ibuprofen Nausea And Vomiting   Norvasc [Amlodipine Besylate]     Itching     ROS Review of Systems    Objective:    Physical Exam  BP (!) 149/98 (BP Location: Left Arm, Patient Position: Sitting, Cuff Size: Normal)   Pulse 91   Temp 98.2 F (36.8 C) (Oral)   Resp 18   Ht 5\' 9"  (1.753 m)   Wt 161 lb (73 kg)   BMI 23.78 kg/m  Wt Readings from Last 3 Encounters:  11/20/20 160 lb 14.4 oz (73 kg)  11/19/20 161 lb (73 kg)  09/24/20 167 lb (75.8 kg)      Health Maintenance Due  Topic Date Due   COVID-19 Vaccine (1) Never done   Hepatitis C Screening  Never done   TETANUS/TDAP  Never done   COLONOSCOPY (Pts 45-44yrs Insurance coverage will need to be confirmed)  Never done   Zoster Vaccines- Shingrix (1 of 2) Never done   INFLUENZA VACCINE  Never done    There are no preventive care reminders to display for this patient.  Lab Results  Component Value Date   TSH 0.496 09/05/2013   Lab Results  Component Value Date   WBC 4.2 04/29/2017   HGB 14.6 04/29/2017   HCT 43.5 04/29/2017   MCV 95 04/29/2017   PLT 246 04/29/2017   Lab Results  Component Value Date   NA 143 04/29/2017   K 4.8 04/29/2017   CO2 26 04/29/2017   GLUCOSE 101 (H) 04/29/2017   BUN 12 04/29/2017   CREATININE 0.85 04/29/2017   BILITOT 0.8 09/05/2013   ALKPHOS 41 09/05/2013   AST 16 09/05/2013   ALT 17 09/05/2013   PROT 7.5 09/05/2013   ALBUMIN 4.7 09/05/2013   CALCIUM 9.3 04/29/2017   Lab Results  Component Value Date   CHOL 216 (H) 04/29/2017   Lab Results  Component Value Date   HDL 115 04/29/2017   Lab Results  Component Value Date   LDLCALC 90 04/29/2017   Lab Results  Component Value Date   TRIG 56 04/29/2017   Lab Results  Component Value Date   CHOLHDL 1.9 04/29/2017   No results found for: HGBA1C    Assessment & Plan:   Problem List Items Addressed This Visit       Cardiovascular and Mediastinum   Essential hypertension   Relevant Medications   losartan (COZAAR) 25 MG tablet     Other   Tinnitus of both ears - Primary   Relevant Orders   Ambulatory referral to ENT   Chronic midline low back pain without sciatica   Relevant Orders   Ambulatory referral to Orthopedic Surgery   BMI 23.0-23.9, adult   Alcohol abuse    Meds ordered this encounter  Medications   losartan (COZAAR) 25 MG tablet    Sig: Take 1 tablet (25 mg total) by mouth daily.    Dispense:  30 tablet    Refill:  1    Order Specific  Question:   Supervising Provider    Answer:   WRIGHT, PATRICK E [[id number]]   1. Tinnitus  of both ears Patient education given on supportive care, reduce alcohol intake, reduce caffeine intake, work on controlling blood pressure  Patient to follow-up with this provider in 2 weeks for further evaluation and blood pressure recheck.  Fasting labs to be completed at that office visit - Ambulatory referral to ENT  2. Essential hypertension Encouraged patient to restart blood pressure medication, check blood pressure at home, keep a written log and have available for all office visits. - losartan (COZAAR) 25 MG tablet; Take 1 tablet (25 mg total) by mouth daily.  Dispense: 30 tablet; Refill: 1  3. Chronic midline low back pain without sciatica Continue supportive care - Ambulatory referral to Orthopedic Surgery    I have reviewed the patient's medical history (PMH, PSH, Social History, Family History, Medications, and allergies) , and have been updated if relevant. I spent 30 minutes reviewing chart and  face to face time with patient.    Follow-up: Return in about 15 days (around 12/04/2020) for [facility].    Kasandra Knudsen Mayers, PA-C

## 2020-11-19 NOTE — Patient Instructions (Addendum)
I started a referral for you to be seen by ear nose and throat to help you with the ringing in your ears.  I strongly encourage you to work on reducing the amount of alcohol, caffeine and cigarettes that you are consuming every day.  I encourage you to read through this education to help you learn other ways that you can manage the ear ringing.  I encourage you to restart your blood pressure medication, check your blood pressure at home on a daily basis, keep a written log and have available for your office visit with me in 2 weeks.  Please be prepared to have fasting labs completed during that office visit.  Roney Jaffe, PA-C Physician Assistant [facility] [email or web address]   Tinnitus Tinnitus refers to hearing a sound when there is no actual source for that sound. This is often described as ringing in the ears. However, people with this condition may hear a variety of noises, in one ear or in both ears. The sounds of tinnitus can be soft, loud, or somewhere in between. Tinnitus can last for a few seconds or can be constant for days. It may go away without treatment and come back at various times. When tinnitus is constant or happens often, it can lead to other problems, such as trouble sleeping and trouble concentrating. Almost everyone experiences tinnitus at some point. Tinnitus is not the same as hearing loss. Tinnitus that is long-lasting (chronic) or comes back often (recurs) may require medical attention. What are the causes? The cause of tinnitus is often not known. In some cases, it can result from: Exposure to loud noises from machinery, music, or other sources. An object (foreign body) stuck in the ear. Earwax buildup. Drinking alcohol or caffeine. Taking certain medicines. Age-related hearing loss. It may also be caused by medical conditions such as: Ear or sinus infections. Heart diseases or high blood  pressure. Allergies. Mnire's disease. Thyroid problems. Tumors. A weak, bulging blood vessel (aneurysm) near the ear. What increases the risk? The following factors may make you more likely to develop this condition: Exposure to loud noises. Age. Tinnitus is more likely in older individuals. Using alcohol or tobacco. What are the signs or symptoms? The main symptom of tinnitus is hearing a sound when there is no source for that sound. It may sound like: Buzzing. Sizzling. Ringing. Blowing air. Hissing. Whistling. Other sounds may include: Roaring. Running water. A musical note. Tapping. Humming. Symptoms may affect only one ear (unilateral) or both ears (bilateral). How is this diagnosed? Tinnitus is diagnosed based on your symptoms, your medical history, and a physical exam. Your health care provider may do a thorough hearing test (audiologic exam) if your tinnitus: Is unilateral. Causes hearing difficulties. Lasts 6 months or longer. You may work with a health care provider who specializes in hearing disorders (audiologist). You may be asked questions about your symptoms and how they affect your daily life. You may have other tests done, such as: CT scan. MRI. An imaging test of how blood flows through your blood vessels (angiogram). How is this treated? Treating an underlying medical condition can sometimes make tinnitus go away. If your tinnitus continues, other treatments may include: Therapy and counseling to help you manage the stress of living with tinnitus. Sound generators to mask the tinnitus. These include: Tabletop sound machines that play relaxing sounds to help you fall asleep. Wearable devices that fit in your ear and play sounds or music. Acoustic neural stimulation. This  involves using headphones to listen to music that contains an auditory signal. Over time, listening to this signal may change some pathways in your brain and make you less sensitive to  tinnitus. This treatment is used for very severe cases when no other treatment is working. Using hearing aids or cochlear implants if your tinnitus is related to hearing loss. Hearing aids are worn in the outer ear. Cochlear implants are surgically placed in the inner ear. Follow these instructions at home: Managing symptoms   When possible, avoid being in loud places and being exposed to loud sounds. Wear hearing protection, such as earplugs, when you are exposed to loud noises. Use a white noise machine, a humidifier, or other devices to mask the sound of tinnitus. Practice techniques for reducing stress, such as meditation, yoga, or deep breathing. Work with your health care provider if you need help with managing stress. Sleep with your head slightly raised. This may reduce the impact of tinnitus. General instructions Do not use stimulants, such as nicotine, alcohol, or caffeine. Talk with your health care provider about other stimulants to avoid. Stimulants are substances that can make you feel alert and attentive by increasing certain activities in the body (such as heart rate and blood pressure). These substances may make tinnitus worse. Take over-the-counter and prescription medicines only as told by your health care provider. Try to get plenty of sleep each night. Keep all follow-up visits. This is important. Contact a health care provider if: Your tinnitus continues for 3 weeks or longer without stopping. You develop sudden hearing loss. Your symptoms get worse or do not get better with home care. You feel you are not able to manage the stress of living with tinnitus. Get help right away if: You develop tinnitus after a head injury. You have tinnitus along with any of the following: Dizziness. Nausea and vomiting. Loss of balance. Sudden, severe headache. Vision changes. Facial weakness or weakness of arms or legs. These symptoms may represent a serious problem that is an  emergency. Do not wait to see if the symptoms will go away. Get medical help right away. Call your local emergency services (911 in the [place] Do not drive yourself to the hospital. Summary Tinnitus refers to hearing a sound when there is no actual source for that sound. This is often described as ringing in the ears. Symptoms may affect only one ear (unilateral) or both ears (bilateral). Use a white noise machine, a humidifier, or other devices to mask the sound of tinnitus. Do not use stimulants, such as nicotine, alcohol, or caffeine. These substances may make tinnitus worse. This information is not intended to replace advice given to you by your health care provider. Make sure you discuss any questions you have with your health care provider. Document Revised: 01/16/2020 Document Reviewed: 01/16/2020 Elsevier Patient Education  2022 [employer].

## 2020-11-19 NOTE — Progress Notes (Signed)
Patients ringing in both ears for the past 3 weeks described as high pitch ringing sound that is constant. Patient used OTC ear drops with no relief.

## 2020-11-20 ENCOUNTER — Encounter: Payer: Self-pay | Admitting: Surgery

## 2020-11-20 ENCOUNTER — Ambulatory Visit: Payer: Medicare HMO | Admitting: Surgery

## 2020-11-20 ENCOUNTER — Ambulatory Visit: Payer: Self-pay

## 2020-11-20 VITALS — BP 133/88 | HR 95 | Ht 69.0 in | Wt 160.9 lb

## 2020-11-20 DIAGNOSIS — M4316 Spondylolisthesis, lumbar region: Secondary | ICD-10-CM

## 2020-11-20 DIAGNOSIS — M25512 Pain in left shoulder: Secondary | ICD-10-CM

## 2020-11-20 DIAGNOSIS — M5136 Other intervertebral disc degeneration, lumbar region: Secondary | ICD-10-CM

## 2020-11-20 DIAGNOSIS — M25562 Pain in left knee: Secondary | ICD-10-CM

## 2020-11-20 DIAGNOSIS — M7542 Impingement syndrome of left shoulder: Secondary | ICD-10-CM

## 2020-11-20 NOTE — Progress Notes (Signed)
Office Visit Note   Patient: Kevin Maxwell           Date of Birth: 21-Nov-1950           MRN: [id number] Visit Date: 11/20/2020              Requested by: Mayers, Kasandra Knudsen, PA-C [address] [place],  [place] [address] PCP: Fleet Contras, MD   Assessment & Plan: Visit Diagnoses:  1. Left knee pain, unspecified chronicity   2. Left shoulder pain, unspecified chronicity   3. Spondylolisthesis of lumbar region   4. DDD (degenerative disc disease), lumbar   5. Impingement syndrome of left shoulder     Plan: Knee exam is unremarkable today.  He will let us know if this becomes more of an issue.  For his chronic issues with his left shoulder and lumbar spine that have failed conservative treatment for several years I recommend getting an MRI scans of both areas.  I will have him follow-up with Dr. Ophelia Charter to review his lumbar spine and Dr. August Saucer to review left shoulder MRI scans in 3 weeks and discuss further treatment options.  No medication given today.  Patient does not seen a primary care physician in at least 3 years and I stressed to him the importance of getting established.  He does have an appointment with community health and wellness next month.  Appointment was made by a mobile health care unit.  Also discussed patient that it is not good to be drinking 2 quarts of beer per day.  Follow-Up Instructions: Return for 3-week appointments with Dr. August Saucer and Dr. Ophelia Charter to review imaging studies of shoulder and lumbar.   Orders:  Orders Placed This Encounter  Procedures   XR KNEE 3 VIEW LEFT   XR Shoulder Left   MR Lumbar Spine w/o contrast   MR Shoulder Left w/o contrast   No orders of the defined types were placed in this encounter.     Procedures: No procedures performed   Clinical Data: No additional findings.   Subjective: Chief Complaint  Patient presents with   Lower Back - New Patient (Initial Visit)   Left Knee - New Patient (Initial Visit)     HPI 70 year old male comes in for evaluation.  Initially appointment was made for chronic low back pain but then patient added on left shoulder issues along with left knee pain.  I reviewed patient's chart and he was seen in 2018 by Dr. August Saucer for left greater than right shoulder pain.  June 2018 he had left [facility] joint Marcaine/Depo-Medrol injection performed.  Last seen by Dr. August Saucer August 2018.  He was seen in the emergency room for left shoulder issues October 18, 2017 and most recently seen in the emergency department September 24, 2020 for left shoulder pain and chronic low back issues.  Left shoulder and low back pain documented in emergency room visits as far back as 2013.  Under care everywhere he has also been seen at [facility] for shoulder and lumbar 2020.  For his left shoulder chronic pain with overactivity and reaching on his back.  Bothers him when he lays on the shoulder.  No describe any cervical spine radicular component.  Low back pain around the central lumbar area.  Some pain down to the buttocks.  Worse when he is ambulating and bending and twisting.  He has had multiple medications over the years from the emergency department and primary care provider without any improvement.  Regards to his left knee he states that he stood up last week and he had a sharp pain in the knee but the pain has since resolved.  Patient wanted x-rays of his left knee as well today.  Patient also states that he drinks "2 quarts of beer" daily and this does help with some of his back pain.  He has not seen his primary care physician in about 3 years.  He was referred to our office from a mobile health unit that he recently saw.  Patient was also referred to [facility] health and wellness to get established with primary care and that appointment is in a couple of weeks. Review of Systems No current cardiopulmonary GI GU issues  Objective: Vital Signs: BP 133/88 (BP Location: Right Arm, Patient Position: Sitting, Cuff Size:  Normal)   Pulse 95   Ht 5\' 9"  (1.753 m)   Wt 160 lb 14.4 oz (73 kg)   SpO2 98%   BMI 23.76 kg/m   Physical Exam HENT:     Head: Normocephalic and atraumatic.  Eyes:     Extraocular Movements: Extraocular movements intact.  Pulmonary:     Effort: No respiratory distress.  Musculoskeletal:     Comments: Gait is normal.  Good cervical spine range of motion.  Left shoulder he has some limitation with flexion and abduction with discomfort.  Moderate to marked positive impingement test.  Negative drop arm test.  Pain and weakness with supraspinatus resistance.  Bilateral lumbar paraspinal tenderness.  Negative logroll bilateral hips.  Negative straight leg raise.  Neurological:     General: No focal deficit present.     Mental Status: He is alert and oriented to person, place, and time.  Psychiatric:        Mood and Affect: Mood normal.    Ortho Exam  Specialty Comments:  No specialty comments available.  Imaging: No results found.   PMFS History: Patient Active Problem List   Diagnosis Date Noted   Trigger thumb of right hand 09/22/2013   Smoking 08/29/2013   Elevated BP 08/29/2013   Pain of right thumb 08/29/2013   Right groin pain 09/30/2012   Hematuria 09/30/2012   Epididymal cyst 09/30/2012   Past Medical History:  Diagnosis Date   Hypertension    Shoulder problem    left shoulder    Family History  Problem Relation Age of Onset   Hypertension Mother     Past Surgical History:  Procedure Laterality Date   HERNIA REPAIR     Social History   Occupational History   Not on file  Tobacco Use   Smoking status: Every Day    Packs/day: 0.25    Types: Cigarettes   Smokeless tobacco: Never  Vaping Use   Vaping Use: Never used  Substance and Sexual Activity   Alcohol use: Yes    Comment: beer   Drug use: Yes    Types: Marijuana   Sexual activity: Yes

## 2020-11-21 DIAGNOSIS — G8929 Other chronic pain: Secondary | ICD-10-CM | POA: Insufficient documentation

## 2020-11-21 DIAGNOSIS — H9313 Tinnitus, bilateral: Secondary | ICD-10-CM | POA: Insufficient documentation

## 2020-11-21 DIAGNOSIS — Z6823 Body mass index (BMI) 23.0-23.9, adult: Secondary | ICD-10-CM | POA: Insufficient documentation

## 2020-11-21 DIAGNOSIS — I10 Essential (primary) hypertension: Secondary | ICD-10-CM | POA: Insufficient documentation

## 2020-11-21 DIAGNOSIS — F1010 Alcohol abuse, uncomplicated: Secondary | ICD-10-CM | POA: Insufficient documentation

## 2020-12-04 ENCOUNTER — Ambulatory Visit: Payer: Medicare HMO | Admitting: Physician Assistant

## 2020-12-11 ENCOUNTER — Ambulatory Visit: Payer: Medicare HMO | Admitting: Orthopaedic Surgery

## 2020-12-13 ENCOUNTER — Other Ambulatory Visit: Payer: Medicare HMO

## 2020-12-14 ENCOUNTER — Ambulatory Visit: Payer: Medicare HMO | Admitting: Orthopedic Surgery

## 2020-12-14 ENCOUNTER — Other Ambulatory Visit: Payer: Self-pay

## 2020-12-18 ENCOUNTER — Ambulatory Visit: Payer: Medicare HMO | Admitting: Orthopaedic Surgery

## 2020-12-27 ENCOUNTER — Other Ambulatory Visit: Payer: Self-pay

## 2020-12-27 ENCOUNTER — Ambulatory Visit
Admission: RE | Admit: 2020-12-27 | Discharge: 2020-12-27 | Disposition: A | Discharge status: Home / Self Care | Payer: Medicare HMO | Source: Ambulatory Visit | Attending: Surgery | Admitting: Surgery

## 2020-12-27 DIAGNOSIS — M25512 Pain in left shoulder: Secondary | ICD-10-CM

## 2020-12-27 DIAGNOSIS — M4316 Spondylolisthesis, lumbar region: Secondary | ICD-10-CM

## 2020-12-27 DIAGNOSIS — M7542 Impingement syndrome of left shoulder: Secondary | ICD-10-CM

## 2020-12-27 IMAGING — MR MR SHOULDER*L* W/O CM
5 series · 40 of 40 positions shown · non-contrast
Comparison: None.

CLINICAL DATA: Chronic shoulder pain.

EXAM:
MRI OF THE LEFT SHOULDER WITHOUT CONTRAST
TECHNIQUE: Multiplanar, multisequence MR imaging of the shoulder was performed.
No intravenous contrast was administered.

[Series 3: T2 fat-sat · axial · 4.0mm · 0.27mm/px · z∈[-98,+45]mm · 10 of 30 slices shown (1 of 3)]
[im 1/30]
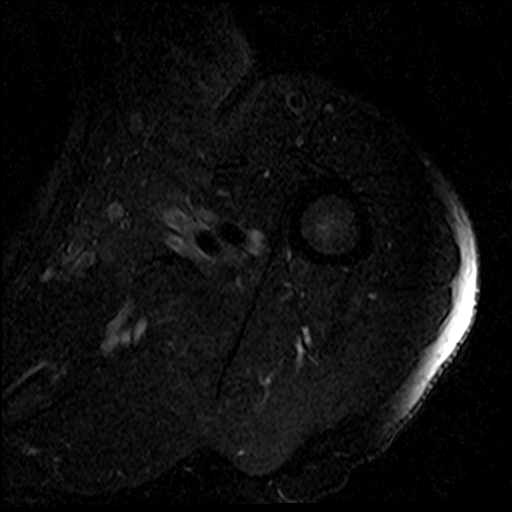
[im 4/30]
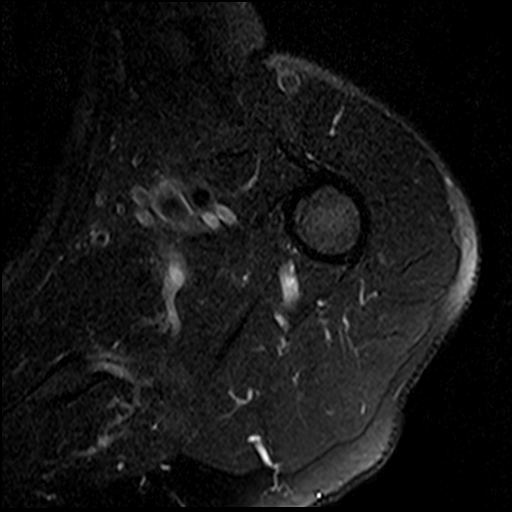
[im 7/30]
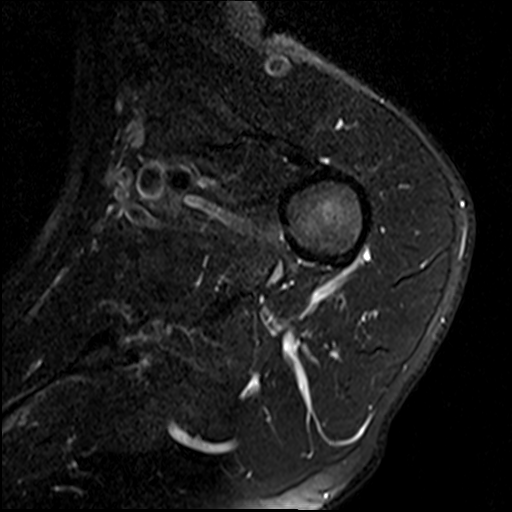
[im 10/30]
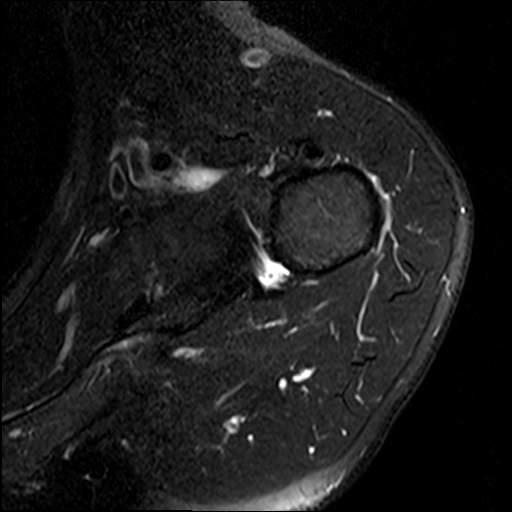
[im 13/30]
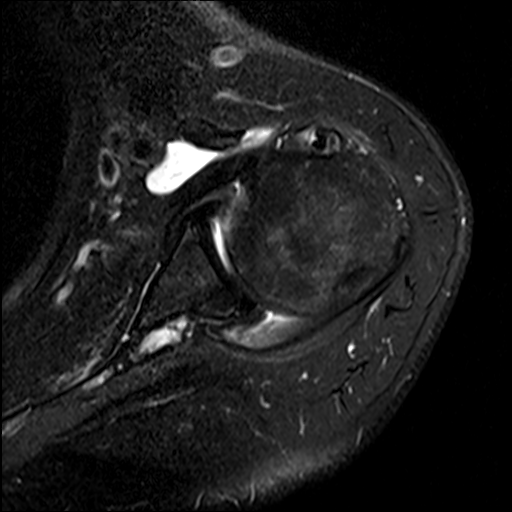
[im 17/30]
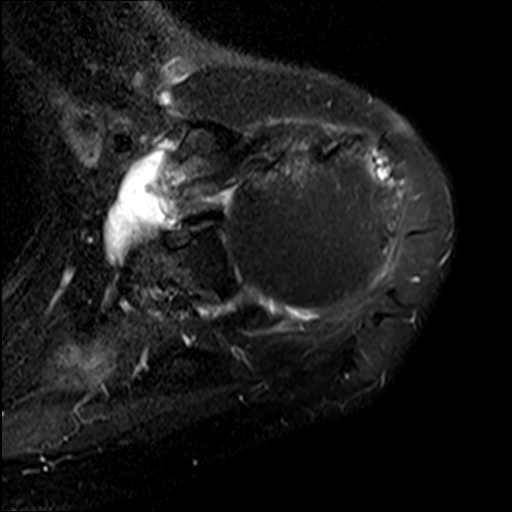
[im 20/30]
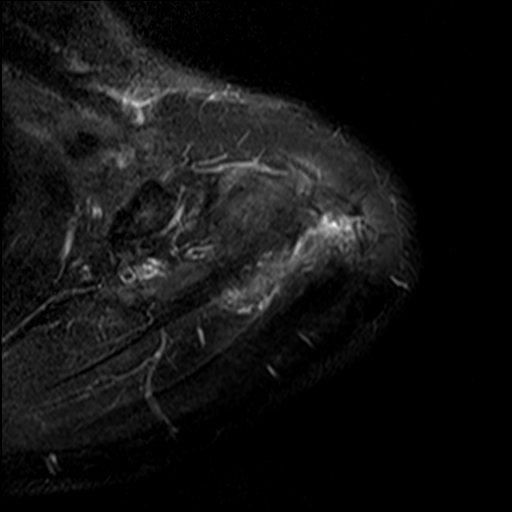
[im 23/30]
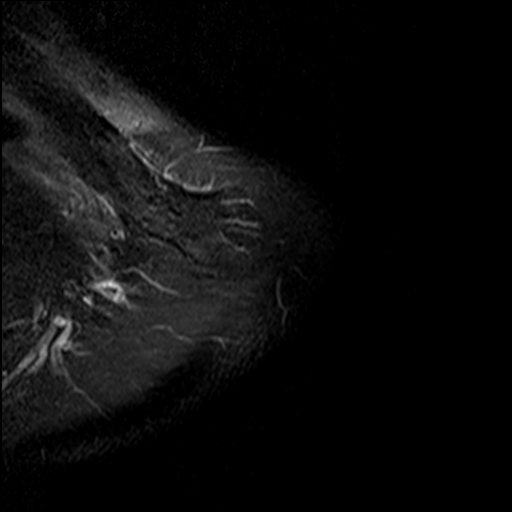
[im 26/30]
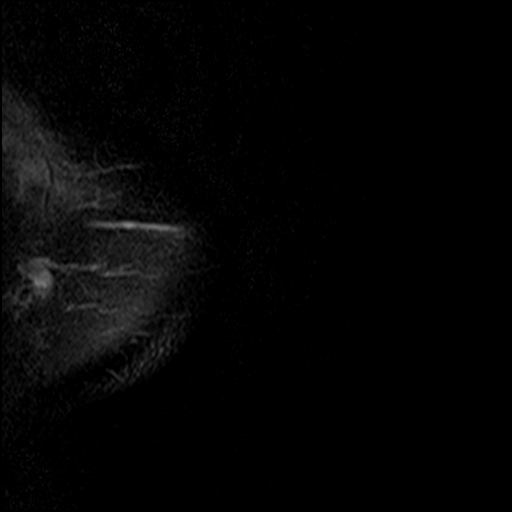
[im 30/30]
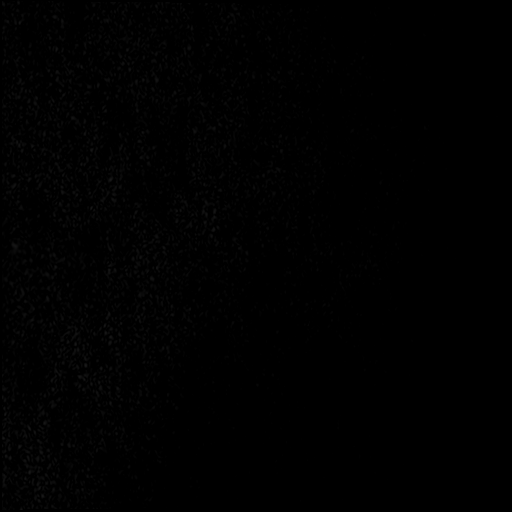

[Series 4: T2 fat-sat · oblique · 4.0mm · 0.59mm/px · 7 of 20 slices shown (2 of 3)]
[im 1/20]
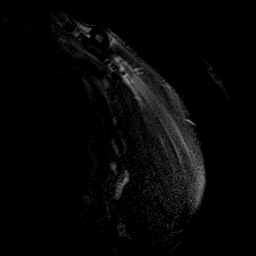
[im 4/20]
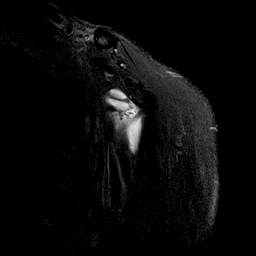
[im 7/20]
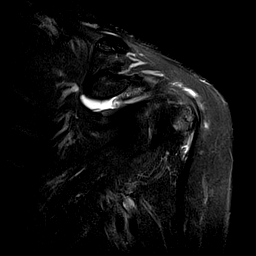
[im 10/20]
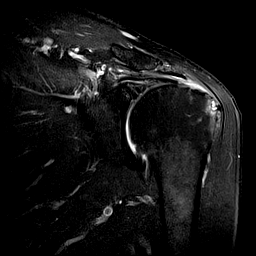
[im 13/20]
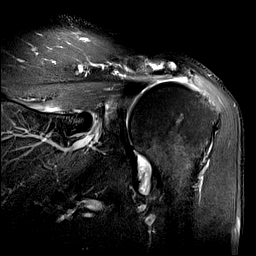
[im 16/20]
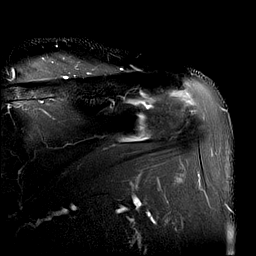
[im 20/20]
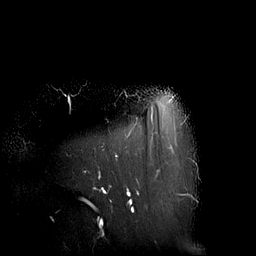

[Series 5: PD · oblique · 4.0mm · 0.59mm/px · 7 of 20 slices shown]
[im 1/20]
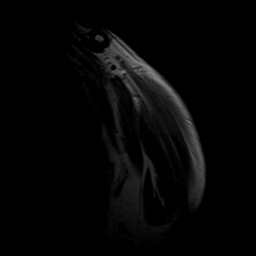
[im 4/20]
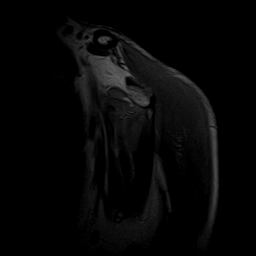
[im 7/20]
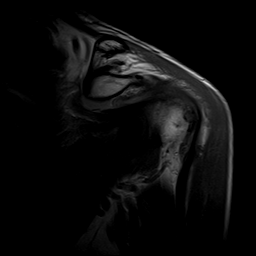
[im 10/20]
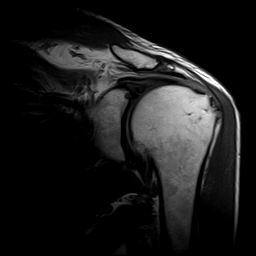
[im 13/20]
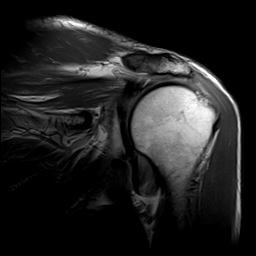
[im 16/20]
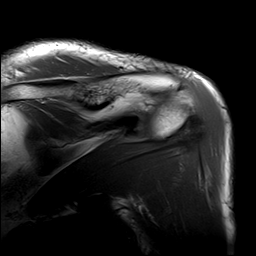
[im 20/20]
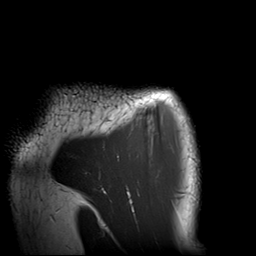

[Series 6: T2 fat-sat · oblique · 4.0mm · 0.59mm/px · 8 of 22 slices shown (3 of 3)]
[im 1/22]
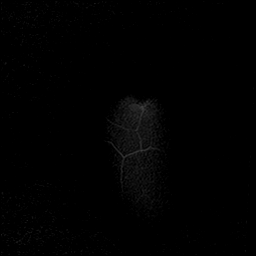
[im 4/22]
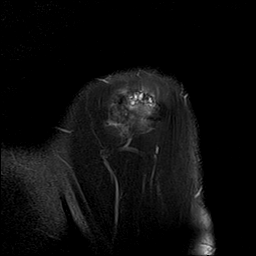
[im 7/22]
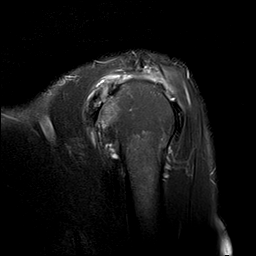
[im 10/22]
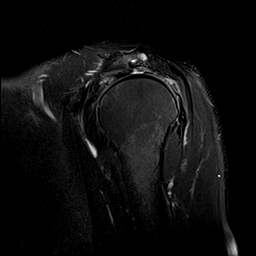
[im 13/22]
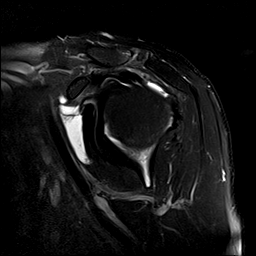
[im 16/22]
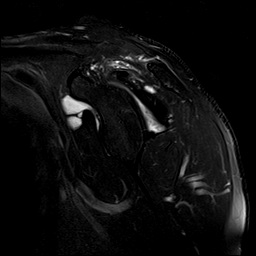
[im 19/22]
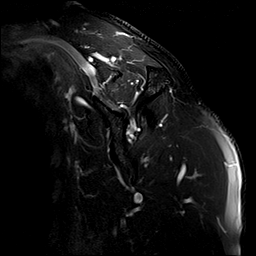
[im 22/22]
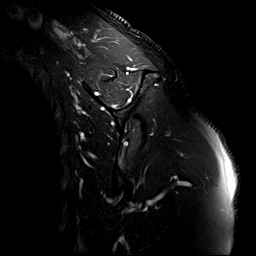

[Series 7: T1 · oblique · 4.0mm · 0.59mm/px · 8 of 22 slices shown]
[im 1/22]
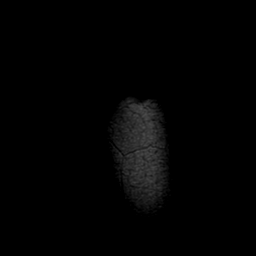
[im 4/22]
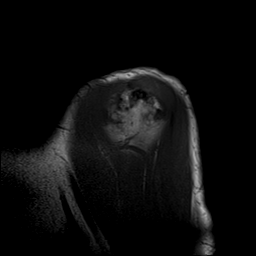
[im 7/22]
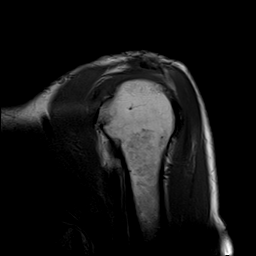
[im 10/22]
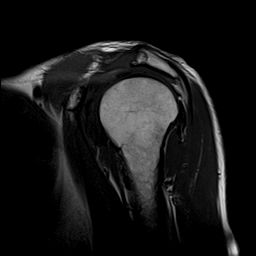
[im 13/22]
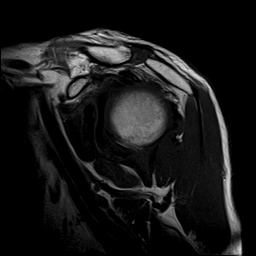
[im 16/22]
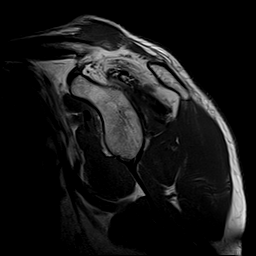
[im 19/22]
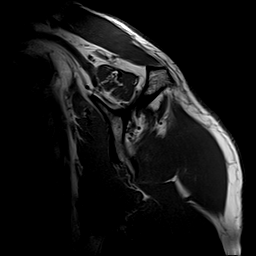
[im 22/22]
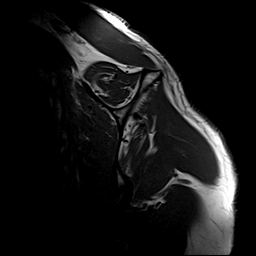

[40 of 40 positions shown; findings below may reference images not displayed]

FINDINGS: Rotator cuff: Complete tear of the supraspinatus tendon and
infraspinatus tendon with 3.7 cm of retraction. Teres minor tendon
is intact. Mild tendinosis of the subscapularis tendon.

Muscles: No muscle atrophy or edema. No intramuscular fluid
collection or hematoma.

Biceps Long Head: Moderate tendinosis of the intra-articular portion
of the long head of the biceps tendon.

Acromioclavicular Joint: Moderate arthropathy of the
acromioclavicular joint. Trace subacromial/subdeltoid bursal fluid.

Glenohumeral Joint: Small joint effusion. Mild partial-thickness
cartilage loss of the glenohumeral joint.

Labrum: Superior and posterior labral degeneration.

Bones: No fracture or dislocation. No aggressive osseous lesion.
Subcortical reactive marrow changes at the supraspinatus insertion.

Other: No fluid collection or hematoma.
IMPRESSION: 1. Complete tear of the supraspinatus tendon and infraspinatus
tendon with 3.7 cm of retraction.
2. Mild tendinosis of the subscapularis tendon.
3. Moderate tendinosis of the intra-articular portion of the long
head of the biceps tendon.

## 2020-12-27 IMAGING — MR MR LUMBAR SPINE W/O CM
4 of 5 series · 26 of 48 positions shown · non-contrast
Comparison: No prior MRI, correlation is made with [DATE]
lumbar spine radiographs

CLINICAL DATA: Low back pain

EXAM:
MRI LUMBAR SPINE WITHOUT CONTRAST
TECHNIQUE: Multiplanar, multisequence MR imaging of the lumbar spine was
performed. No intravenous contrast was administered.

[Series 2: T2 · sagittal · 4.0mm · 1.09mm/px · 5 of 17 slices shown (1 of 2)]
[im 1/17]
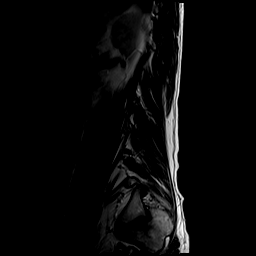
[im 5/17]
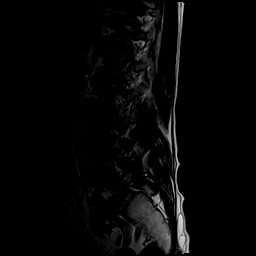
[im 9/17]
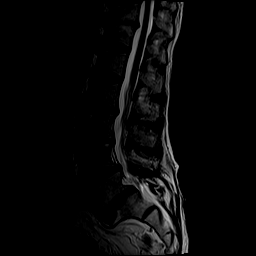
[im 13/17]
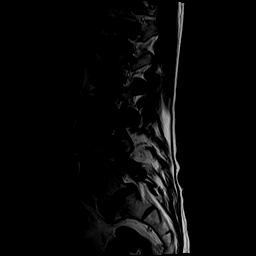
[im 17/17]
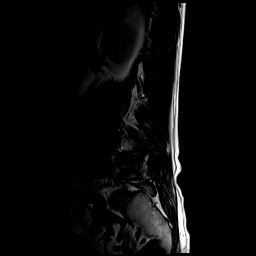

[Series 4: T1 · sagittal · 4.0mm · 1.09mm/px · 6 of 17 slices shown (1 of 2)]
[im 1/17]
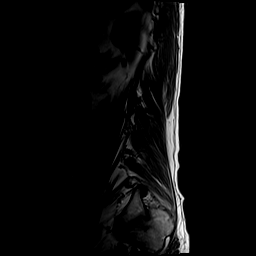
[im 4/17]
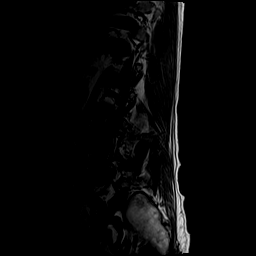
[im 7/17]
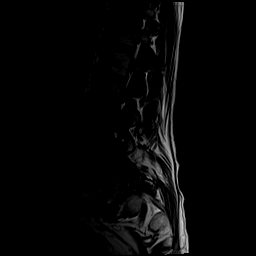
[im 10/17]
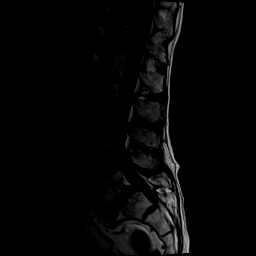
[im 13/17]
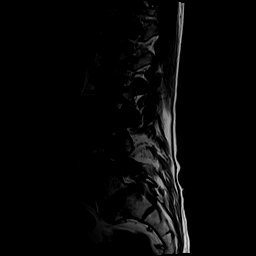
[im 17/17]
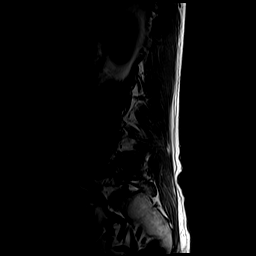

[Series 5: T2 · axial · 4.0mm · 0.39mm/px · z∈[-131,+79]mm · 10 of 47 slices shown (2 of 2)]
[im 4/47]
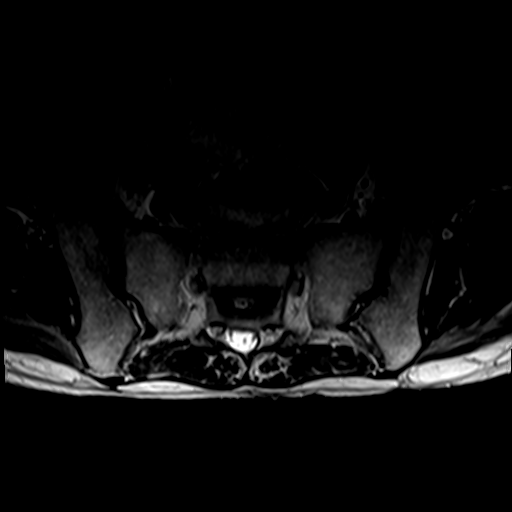
[im 7/47]
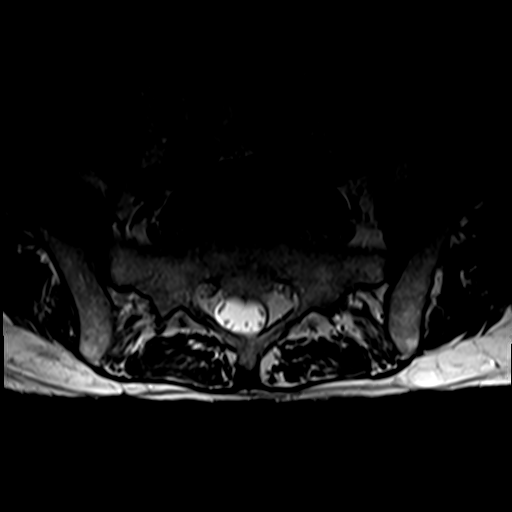
[im 10/47]
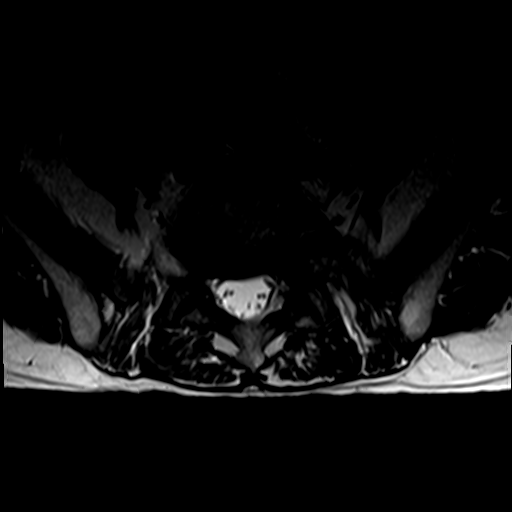
[im 16/47]
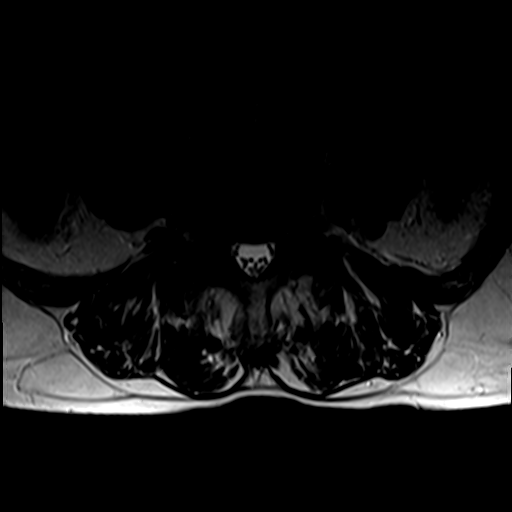
[im 22/47]
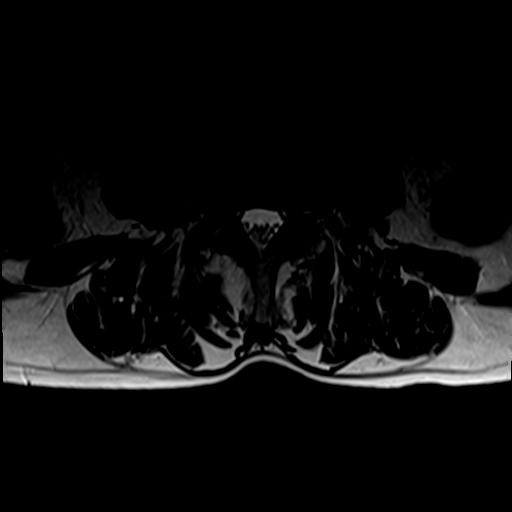
[im 25/47]
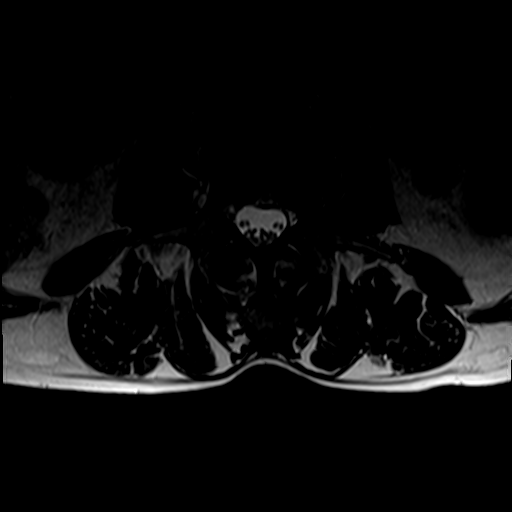
[im 28/47]
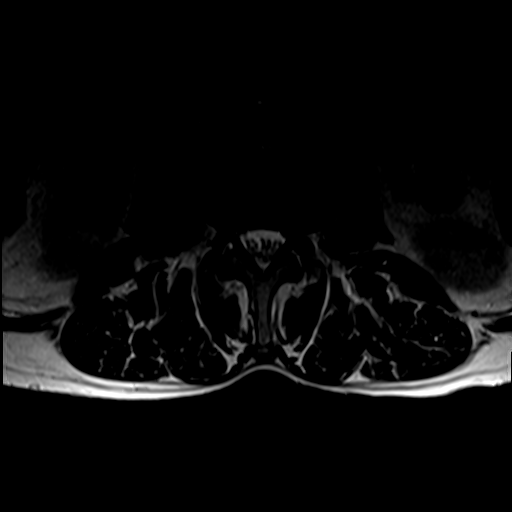
[im 34/47]
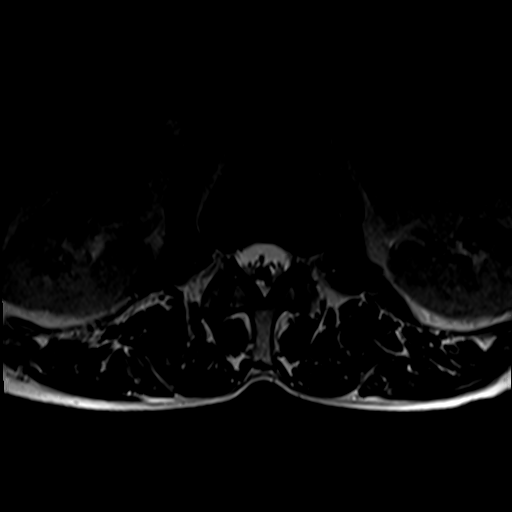
[im 40/47]
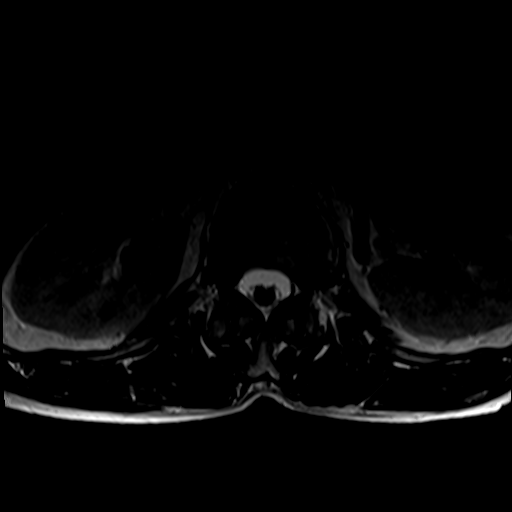
[im 47/47]
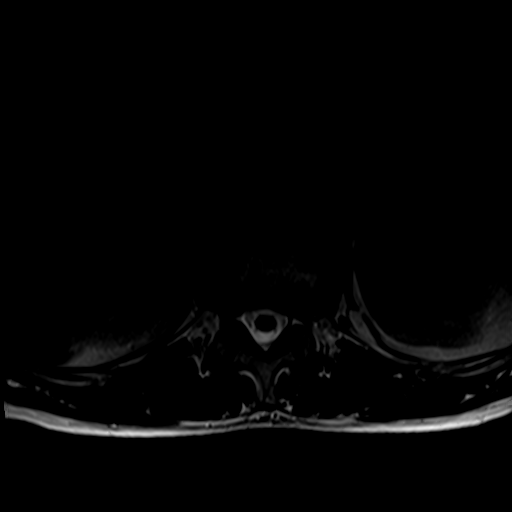

[Series 6: T1 · axial · 4.0mm · 0.39mm/px · z∈[-131,+45]mm · 5 of 47 slices shown (2 of 2)]
[im 4/47]
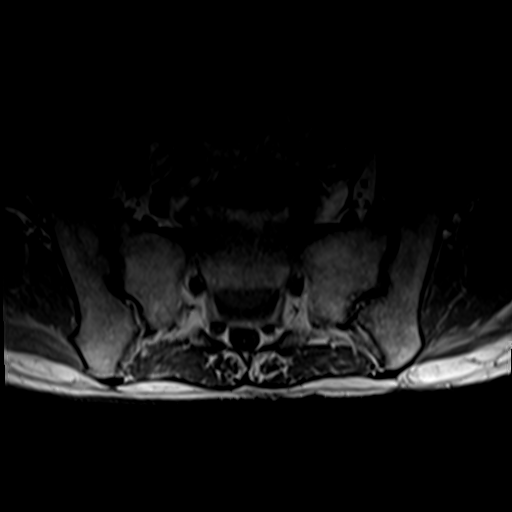
[im 7/47]
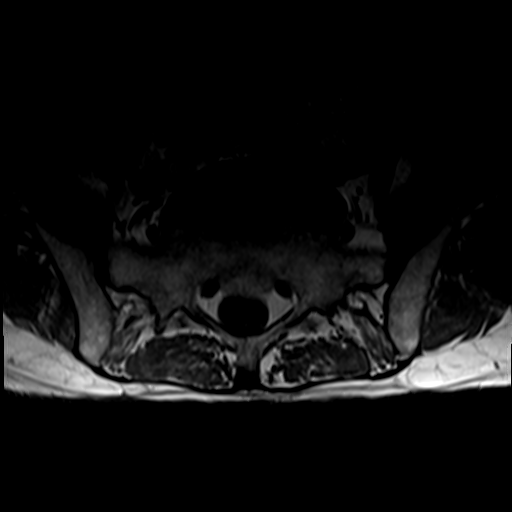
[im 10/47]
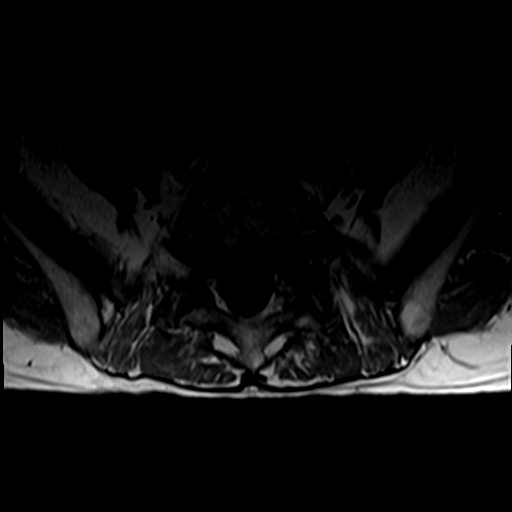
[im 25/47]
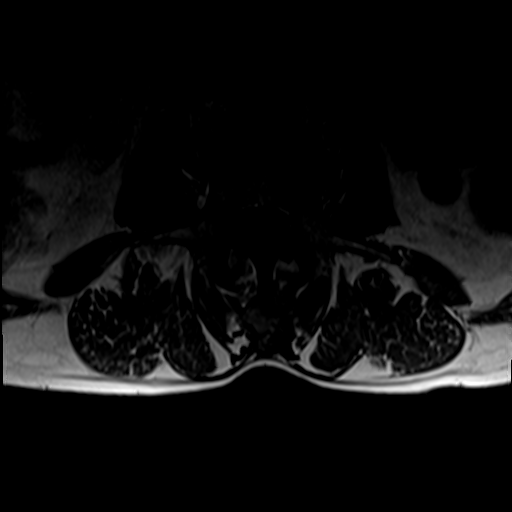
[im 40/47]
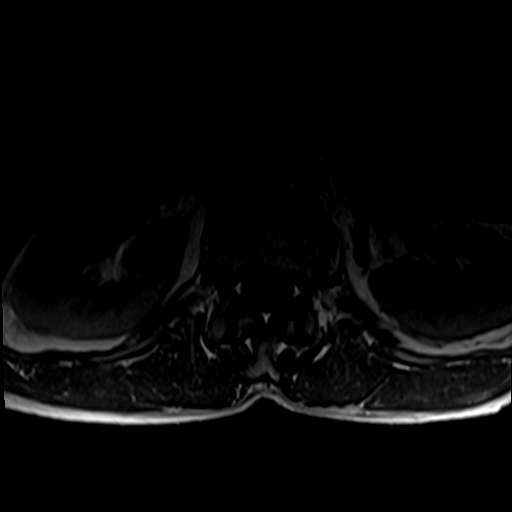

[26 of 48 positions shown; findings below may reference images not displayed]

FINDINGS: Segmentation:  Standard.

Alignment:  Grade 1 anterolisthesis of L4 on L5 and L5 on S1.

Vertebrae:  No acute fracture or suspicious lesion.

Conus medullaris and cauda equina: Conus extends to the L2 level.
Conus and cauda equina appear normal.

Paraspinal and other soft tissues: Bilateral renal cysts.

Disc levels:

T11-T12: Seen only on the sagittal images. Disc height loss with
disc osteophyte complex. No spinal canal stenosis or neural
foraminal narrowing.

T12-L1: No significant disc bulge. No spinal canal stenosis or
neural foraminal narrowing.

L1-L2: No significant disc bulge. Mild facet arthropathy. No spinal
canal stenosis or neural foraminal narrowing.

L2-L3: Mild disc bulge. Mild facet arthropathy. No spinal canal
stenosis or neural foraminal narrowing.

L3-L4: Broad-based disc bulge. Moderate facet arthropathy. No spinal
canal stenosis. Mild bilateral neural foraminal narrowing.

L4-L5: Grade 1 anterolisthesis with disc unroofing. Severe facet
arthropathy. No spinal canal stenosis. Severe left and moderate
right neural foraminal narrowing.

L5-S1: Grade 1 anterolisthesis with disc unroofing and broad-based
disc bulge. Severe facet arthropathy. No spinal canal stenosis.
Moderate to severe bilateral neural foraminal narrowing
IMPRESSION: 1. L4-L5 severe left and moderate right neural foraminal narrowing.
2. L5-S1 moderate to severe bilateral neural foraminal narrowing.
3. L3-L4 mild bilateral neural foraminal narrowing.

## 2021-01-04 ENCOUNTER — Ambulatory Visit: Payer: Medicare HMO | Admitting: Orthopedic Surgery

## 2021-06-26 ENCOUNTER — Encounter (HOSPITAL_COMMUNITY): Payer: Self-pay

## 2021-06-26 ENCOUNTER — Emergency Department (HOSPITAL_COMMUNITY): Payer: Medicare Other

## 2021-06-26 ENCOUNTER — Emergency Department (HOSPITAL_COMMUNITY)
Admission: EM | Admit: 2021-06-26 | Discharge: 2021-06-26 | Disposition: A | Discharge status: Home / Self Care | Payer: Medicare Other | Attending: Emergency Medicine | Admitting: Emergency Medicine

## 2021-06-26 DIAGNOSIS — W260XXA Contact with knife, initial encounter: Secondary | ICD-10-CM | POA: Diagnosis not present

## 2021-06-26 DIAGNOSIS — L089 Local infection of the skin and subcutaneous tissue, unspecified: Secondary | ICD-10-CM | POA: Insufficient documentation

## 2021-06-26 DIAGNOSIS — Y990 Civilian activity done for income or pay: Secondary | ICD-10-CM | POA: Insufficient documentation

## 2021-06-26 DIAGNOSIS — S60941A Unspecified superficial injury of left index finger, initial encounter: Secondary | ICD-10-CM | POA: Diagnosis not present

## 2021-06-26 DIAGNOSIS — S6992XA Unspecified injury of left wrist, hand and finger(s), initial encounter: Secondary | ICD-10-CM | POA: Diagnosis present

## 2021-06-26 DIAGNOSIS — Z23 Encounter for immunization: Secondary | ICD-10-CM | POA: Diagnosis not present

## 2021-06-26 IMAGING — DX DG FINGER INDEX 2+V*L*
3 series · 3 of 3 positions shown · non-contrast
Comparison: Left hand radiographs [DATE]

CLINICAL DATA: Evaluate for foreign body. Thorn in left index
finger for 2 weeks. Medial side of distal phalanx. Took knife and
cut out. Now swollen and painful.

EXAM:
LEFT INDEX FINGER 2+V

[finger ap]
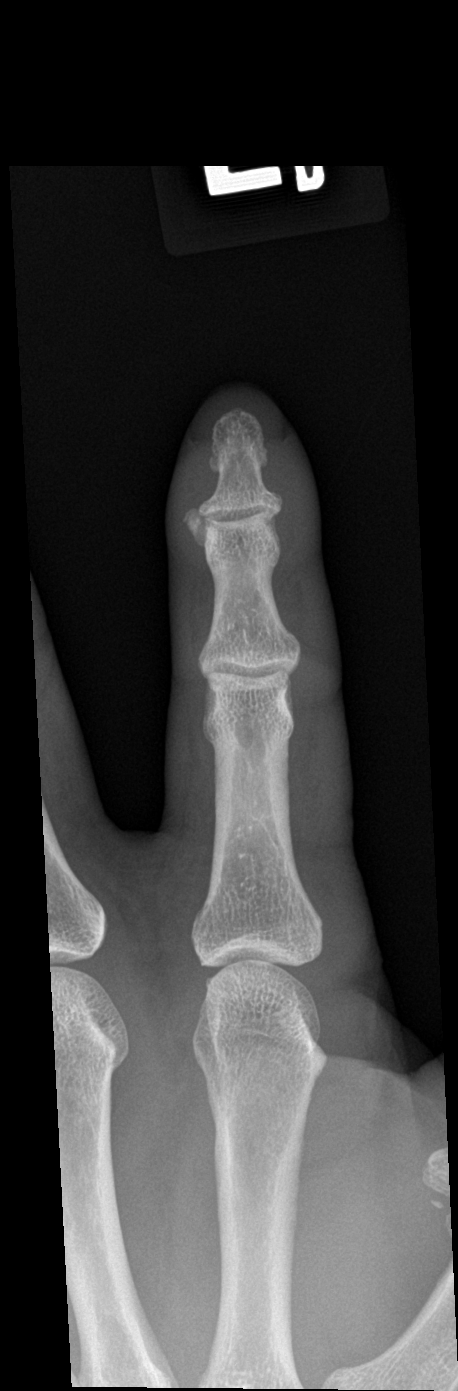

[finger obl]
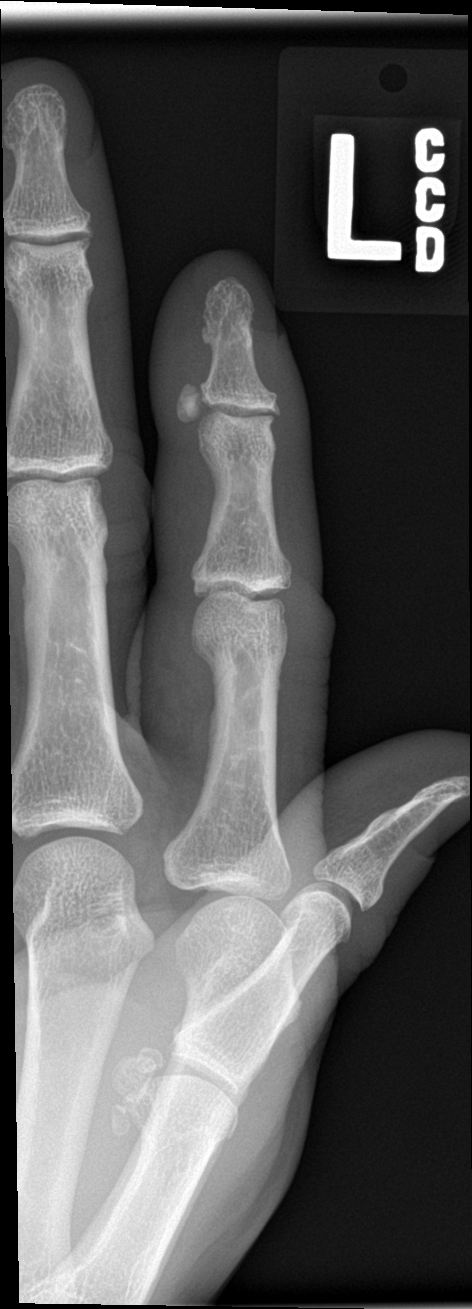

[finger lat]
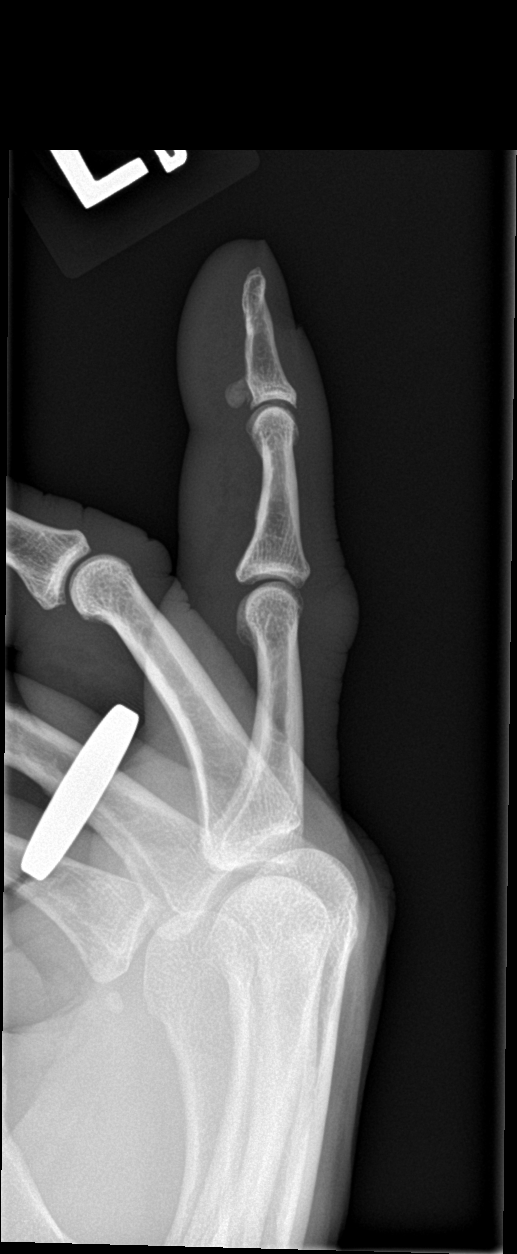

[3 of 3 positions shown; findings below may reference images not displayed]

FINDINGS: There is mild joint space narrowing and peripheral spurring at the
index finger DIP joint. Bordering the medial and volar aspect of the
DIP joint is apparent calcific density measuring up to approximately
5 mm. On oblique view there is question of this representing a
fractured degenerative spur at the medial base of the distal
phalanx, however I favor this not to represent bone but rather
calcification, and this may represent calcification forming around a
foreign body/in an area of trauma given the patient's history. There
is moderate index finger soft tissue swelling.

Minimal degenerative spurring at the index finger PIP joint.
IMPRESSION: :
IMPRESSION: 1. Apparent calcific density measuring up to 5 mm bordering the
medial and volar aspect of the index finger DIP joint. Given the
patient's history this could represent calcification forming around
an area of prior trauma versus foreign body in this location.
2. No acute fracture.

## 2021-06-26 MED ORDER — AMOXICILLIN-POT CLAVULANATE 875-125 MG PO TABS: 1.0000 | ORAL_TABLET | Freq: Two times a day (BID) | ORAL | 0 refills | Status: DC

## 2021-06-26 MED ORDER — KETOROLAC TROMETHAMINE 15 MG/ML IJ SOLN
15.0000 mg | Freq: Once | INTRAMUSCULAR | Status: AC
  Administered 2021-06-26: 15 mg via INTRAMUSCULAR
  Filled 2021-06-26: qty 1

## 2021-06-26 MED ORDER — TETANUS-DIPHTH-ACELL PERTUSSIS 5-2.5-18.5 LF-MCG/0.5 IM SUSY
0.5000 mL | PREFILLED_SYRINGE | Freq: Once | INTRAMUSCULAR | Status: AC
  Administered 2021-06-26: 0.5 mL via INTRAMUSCULAR
  Filled 2021-06-26: qty 0.5

## 2021-06-26 MED ORDER — AMOXICILLIN-POT CLAVULANATE 875-125 MG PO TABS
1.0000 | ORAL_TABLET | Freq: Once | ORAL | Status: AC
  Administered 2021-06-26: 1 via ORAL
  Filled 2021-06-26: qty 1

## 2021-06-26 NOTE — ED Provider Notes (Signed)
?[facility]-EMERGENCY DEPT ?Provider Note ? ? ?CSN: [id number] ?Arrival date & time: 06/26/21  0715 ? ?  ? ?History ? ?Chief Complaint  ?Patient presents with  ? Finger Injury  ? ? ?Kevin Maxwell is a 71 y.o. male. ? ?71 year old male with prior medical history as detailed below presents for evaluation.  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  He reports that approximately 2 weeks ago he got a small thorn embedded in his left index finger.  2 days ago he decided to cut this out with his pocket knife.  Subsequently he developed redness and pain around the incision site in the area of his left index finger where he had removed the thorn.  He feels sure that he did remove the thorn.  He denies fever.  He reports pain around the site where he used his pocket knife to remove the thorn.  He did not take anything for pain other than his wife's Percocet which did help somewhat.  He is unsure of his last tetanus shot. ? ?He is otherwise without complaint. ? ?The history is provided by the patient and medical records.  ?Illness ?Location:  Left index finger pain after removing a thorn/splinter ?Severity:  Mild ?Onset quality:  Gradual ?Duration:  2 days ?Timing:  Constant ?Progression:  Unchanged ?Chronicity:  New ? ?  ? ?Home Medications ?Prior to Admission medications   ?Medication Sig Start Date End Date Taking? Authorizing Provider  ?acetaminophen (TYLENOL) 500 MG tablet Take 2 tablets (1,000 mg total) by mouth every 6 (six) hours as needed for moderate pain. 09/24/20   Fayrene Helper, PA-C  ?albuterol (PROVENTIL HFA;VENTOLIN HFA) 108 (90 Base) MCG/ACT inhaler Inhale 2 puffs into the lungs every 6 (six) hours as needed for wheezing or shortness of breath. 09/22/17   Claiborne Rigg, NP  ?benzonatate (TESSALON) 100 MG capsule Take 1 capsule (100 mg total) by mouth every 8 (eight) hours. ?Patient not taking: Reported on 11/19/2020 12/14/18   Lorelee New, PA-C  ?cyclobenzaprine (FLEXERIL) 10 MG  tablet Take 1 tablet (10 mg total) by mouth 3 (three) times daily as needed for muscle spasms. 09/22/17   Claiborne Rigg, NP  ?diclofenac sodium (VOLTAREN) 1 % GEL Apply 4 g topically 4 (four) times daily. 09/24/20   Fayrene Helper, PA-C  ?losartan (COZAAR) 25 MG tablet Take 1 tablet (25 mg total) by mouth daily. 11/19/20   Mayers, Kasandra Knudsen, PA-C  ?   ? ?Allergies    ?Aspirin, Ibuprofen, and Norvasc [amlodipine besylate]   ? ?Review of Systems   ?Review of Systems  ?All other systems reviewed and are negative. ? ?Physical Exam ?Updated Vital Signs ?BP (!) 138/95 (BP Location: Right Arm)   Pulse 97   Temp 98 ?F (36.7 ?C) (Oral)   Resp 16   Ht 5\' 9"  (1.753 m)   Wt 73.5 kg   SpO2 100%   BMI 23.92 kg/m?  ?Physical Exam ?Vitals and nursing note reviewed.  ?Constitutional:   ?   General: He is not in acute distress. ?   Appearance: Normal appearance. He is well-developed.  ?HENT:  ?   Head: Normocephalic and atraumatic.  ?Eyes:  ?   Conjunctiva/sclera: Conjunctivae normal.  ?   Pupils: Pupils are equal, round, and reactive to light.  ?Cardiovascular:  ?   Rate and Rhythm: Normal rate and regular rhythm.  ?   Heart sounds: Normal heart sounds.  ?Pulmonary:  ?   Effort: Pulmonary  effort is normal. No respiratory distress.  ?   Breath sounds: Normal breath sounds.  ?Abdominal:  ?   General: There is no distension.  ?   Palpations: Abdomen is soft.  ?   Tenderness: There is no abdominal tenderness.  ?Musculoskeletal:     ?   General: No deformity. Normal range of motion.  ?   Cervical back: Normal range of motion and neck supple.  ?Skin: ?   General: Skin is warm and dry.  ?   Comments: Minimal erythema along the medial aspect of the left index finger surrounding small healing incision.  Minimal to no edema noted. AROM in left index finger intact. See photos below.  ?Neurological:  ?   General: No focal deficit present.  ?   Mental Status: He is alert and oriented to person, place, and time.  ? ? ? ? ? ? ?ED Results /  Procedures / Treatments   ?Labs ?(all labs ordered are listed, but only abnormal results are displayed) ?Labs Reviewed - No data to display ? ?EKG ?None ? ?Radiology ?No results found. ? ?Procedures ?Procedures  ? ? ?Medications Ordered in ED ?Medications  ?Tdap (BOOSTRIX) injection 0.5 mL (has no administration in time range)  ?ketorolac (TORADOL) 15 MG/ML injection 15 mg (has no administration in time range)  ?amoxicillin-clavulanate (AUGMENTIN) 875-125 MG per tablet 1 tablet (has no administration in time range)  ? ? ?ED Course/ Medical Decision Making/ A&P ?  ?                        ?Medical Decision Making ?Amount and/or Complexity of Data Reviewed ?Radiology: ordered. ? ?Risk ?Prescription drug management. ? ? ? ?Medical Screen Complete ? ?This patient presented to the ED with complaint of suspected infection of left index finger. ? ?This complaint involves an extensive number of treatment options. The initial differential diagnosis includes, but is not limited to, suspected infection, possible retained foreign body, self-inflicted injury, etc. ? ?This presentation is: Acute, Self-Limited, Previously Undiagnosed, Uncertain Prognosis, and Complicated ? ?Patient is presenting with suspected infection around self-inflicted incision to the left index finger.  Patient reports that he had a small thorn embedded in his left index finger 2 weeks ago.  He decided 2 days ago to remove it with a pocket knife.  His pocket knife was not sterile.  He is unsure of his last tetanus update. ? ?Exam is suggestive of possible early infection around patient's self-inflicted incision site. ? ?Suspicion for pathology such as tenosynovitis or abscess is low.  Patient with full active range of motion of the left index finger. ? ?Imaging obtained is without evidence of foreign body. ? ?Patient would likely benefit from course of antibiotics. ? ?Patient does understand need for close follow-up with both his PCP and also with hand  specialist.   ? ?Patient understands need to complete entire course of prescribed antibiotics. ? ?Strict return precautions given and understood.  Importance of close follow-up is repeatedly stressed. ? ? ? ?Additional history obtained: ? ?External records from outside sources obtained and reviewed including prior ED visits and prior Inpatient records.  ? ?Imaging Studies ordered: ? ?I ordered imaging studies including left index finger  ?I independently visualized and interpreted obtained imaging which showed NAD ?I agree with the radiologist interpretation. ? ? ?Medicines ordered: ? ?I ordered medication including Toradol, tetanus update, Augmentin for possible infected incision site ?Reevaluation of the patient after these medicines showed that the  patient: improved ? ? ?Problem List / ED Course: ? ?Possible infected incision site on left index finger ? ? ?Reevaluation: ? ?After the interventions noted above, I reevaluated the patient and found that they have: improved ? ?Disposition: ? ?After consideration of the diagnostic results and the patients response to treatment, I feel that the patent would benefit from close outpatient follow-up.  ? ? ? ? ? ? ? ? ?Final Clinical Impression(s) / ED Diagnoses ?Final diagnoses:  ?Superficial injury of left index finger with infection  ? ? ?Rx / DC Orders ?ED Discharge Orders   ? ?      Ordered  ?  amoxicillin-clavulanate (AUGMENTIN) 875-125 MG tablet  Every 12 hours       ? 06/26/21 1011  ? ?  ?  ? ?  ? ? ?  ?Wynetta FinesMessick, Paxtyn Wisdom C, MD ?06/26/21 1021 ? ?

## 2021-06-26 NOTE — Discharge Instructions (Addendum)
Return for any problem.  ? ?Follow up with both your regular care provider and hand specialists as instructed.  ? ?Take all of antibiotic as prescribed. ? ? ?

## 2021-06-26 NOTE — ED Triage Notes (Signed)
Pt arrived via POV, c/o pain to index finger left hand. States he had a thorn in it for x2 wks. Took knife and cut it out. Now swollen and painful.  ?

## 2022-01-19 ENCOUNTER — Emergency Department (HOSPITAL_COMMUNITY)
Admission: EM | Admit: 2022-01-19 | Discharge: 2022-01-19 | Disposition: A | Discharge status: Home / Self Care | Payer: Medicare Other | Attending: Emergency Medicine | Admitting: Emergency Medicine

## 2022-01-19 ENCOUNTER — Encounter (HOSPITAL_COMMUNITY): Payer: Self-pay | Admitting: Emergency Medicine

## 2022-01-19 ENCOUNTER — Emergency Department (HOSPITAL_COMMUNITY): Payer: Medicare Other

## 2022-01-19 ENCOUNTER — Other Ambulatory Visit: Payer: Self-pay

## 2022-01-19 DIAGNOSIS — Z79899 Other long term (current) drug therapy: Secondary | ICD-10-CM | POA: Insufficient documentation

## 2022-01-19 DIAGNOSIS — M779 Enthesopathy, unspecified: Secondary | ICD-10-CM

## 2022-01-19 DIAGNOSIS — I10 Essential (primary) hypertension: Secondary | ICD-10-CM | POA: Diagnosis not present

## 2022-01-19 DIAGNOSIS — M2570 Osteophyte, unspecified joint: Secondary | ICD-10-CM | POA: Insufficient documentation

## 2022-01-19 NOTE — ED Provider Notes (Signed)
[facility]-EMERGENCY DEPT Provider Note   CSN: [id number] Arrival date & time: 01/19/22  0715     History HTN Chief Complaint  Patient presents with   rib cage pain   rib cage bump    Kevin Maxwell is a 71 y.o. male.  71 year old male with a past medical history of high blood pressure, left shoulder problems such as rotator cuff presents to the ED with a chief complaint of bone spur to his chest for the past 5 years.  Orts he has noted this 5 years ago, has been keeping an eye on it however over the past week he feels that this likely grew.  There is no pain around this part, no pain with palpation but he feels that this has gotten increased in size.  He does have a primary care physician, he has not discussed this with his primary care physician as he reports that he is from [place] "he does not get the [place] way".  Does smoke about 1/2 pack of cigarettes a day.  Also endorses a head cold right now with some cough.  No chest pain, no shortness of breath, no other complaints.  The history is provided by the patient and medical records.       Home Medications Prior to Admission medications   Medication Sig Start Date End Date Taking? Authorizing Provider  acetaminophen (TYLENOL) 500 MG tablet Take 2 tablets (1,000 mg total) by mouth every 6 (six) hours as needed for moderate pain. 09/24/20   Fayrene Helper, PA-C  albuterol (PROVENTIL HFA;VENTOLIN HFA) 108 (90 Base) MCG/ACT inhaler Inhale 2 puffs into the lungs every 6 (six) hours as needed for wheezing or shortness of breath. 09/22/17   Claiborne Rigg, NP  amoxicillin-clavulanate (AUGMENTIN) 875-125 MG tablet Take 1 tablet by mouth every 12 (twelve) hours. 06/26/21   Wynetta Fines, MD  benzonatate (TESSALON) 100 MG capsule Take 1 capsule (100 mg total) by mouth every 8 (eight) hours. Patient not taking: Reported on 11/19/2020 12/14/18   Lorelee New, PA-C  cyclobenzaprine (FLEXERIL) 10 MG tablet Take 1 tablet (10  mg total) by mouth 3 (three) times daily as needed for muscle spasms. 09/22/17   Claiborne Rigg, NP  diclofenac sodium (VOLTAREN) 1 % GEL Apply 4 g topically 4 (four) times daily. 09/24/20   Fayrene Helper, PA-C  losartan (COZAAR) 25 MG tablet Take 1 tablet (25 mg total) by mouth daily. 11/19/20   Mayers, Cari S, PA-C      Allergies    Aspirin, Ibuprofen, and Norvasc [amlodipine besylate]    Review of Systems   Review of Systems  Constitutional:  Negative for fever.  Respiratory:  Negative for shortness of breath.   Cardiovascular:  Negative for chest pain.  Gastrointestinal:  Negative for abdominal pain.  Genitourinary:  Negative for flank pain.  Musculoskeletal:  Negative for back pain.  Skin:  Negative for pallor.    Physical Exam Updated Vital Signs BP 125/82 (BP Location: Left Arm)   Pulse 64   Temp 97.7 F (36.5 C) (Oral)   Resp 18   Ht 5\' 9"  (1.753 m)   Wt 68 kg   SpO2 100%   BMI 22.15 kg/m  Physical Exam Vitals reviewed.  Constitutional:      Appearance: Normal appearance.  HENT:     Head: Normocephalic and atraumatic.     Mouth/Throat:     Mouth: Mucous membranes are moist.  Eyes:     Pupils: Pupils  are equal, round, and reactive to light.  Cardiovascular:     Rate and Rhythm: Normal rate.  Pulmonary:     Effort: Pulmonary effort is normal.     Breath sounds: No wheezing or rales.     Comments: Lungs are clear without any wheezing or rales.  Chest:     Chest wall: No tenderness.    Abdominal:     General: Abdomen is flat.     Tenderness: There is no abdominal tenderness.  Musculoskeletal:     Cervical back: Normal range of motion and neck supple.  Skin:    General: Skin is warm and dry.  Neurological:     Mental Status: He is alert and oriented to person, place, and time.     ED Results / Procedures / Treatments   Labs (all labs ordered are listed, but only abnormal results are displayed) Labs Reviewed - No data to  display  EKG None  Radiology DG Chest 2 View  Result Date: 01/19/2022 CLINICAL DATA:  Chest pain.  Protrusion of chest wall/possible rib. EXAM: CHEST - 2 VIEW COMPARISON:  12/14/2018 chest radiograph FINDINGS: The cardiomediastinal silhouette is unremarkable. There is no evidence of focal airspace disease, pulmonary edema, suspicious pulmonary nodule/mass, pleural effusion, or pneumothorax. No acute bony abnormalities are identified. IMPRESSION: No active cardiopulmonary disease. Recommend clinical or imaging follow-up for area of palpable concern as indicated. Electronically Signed   By: Harmon Pier M.D.   On: 01/19/2022 08:57    Procedures Procedures    Medications Ordered in ED Medications - No data to display  ED Course/ Medical Decision Making/ A&P                           Medical Decision Making Amount and/or Complexity of Data Reviewed Radiology: ordered.    Patient presents to the ED with a chief complaint of bone spur that is been present for 5 years, no tenderness with palpation however does endorse that he has noted to increase in size within the past week.  He also complains of left shoulder pain that is been ongoing for several years.  He does have established PCP however has not brought any of these complaints to him.  On evaluation there is a palpable bony spur noted on his chest, no surrounding erythema or streaking to suggest infection.  No fluctuance, not indurated to suggest an abscess.  Denies any prior history of trauma.  He is not having any chest pain or shortness of breath.  We discussed obtaining a chest x-ray to further evaluate lesion.  Of note, patient does smoke a half a pack a day, but no prior history of COPD.  Chest x-ray did not show any abnormality, no bone spur was noted by them, I do feel this is most likely superficial.  There was no mass or knotting noted to his lungs.  In addition now he is reporting his ongoing left shoulder pain and left hip  pain.  No trauma, no decrease in strength I do feel the patient is appropriate for follow-up with orthopedics outpatient.  He is agreeable to plan treatment, provided with a copy of his x-ray, patient is hemodynamically stable for discharge.    Portions of this note were generated with Scientist, clinical (histocompatibility and immunogenetics). Dictation errors may occur despite best attempts at proofreading.  Final Clinical Impression(s) / ED Diagnoses Final diagnoses:  Bone spur    Rx / DC Orders ED Discharge Orders  None         Claude Manges, PA-C 01/19/22 5631    Arby Barrette, MD 01/20/22 [phone]

## 2022-01-19 NOTE — Discharge Instructions (Addendum)
We discussed the results of your x-ray on today's visit.  You were given a referral to the orthopedist in order to obtain further evaluation of your left shoulder pain, and your left hip pain.

## 2022-01-19 NOTE — ED Triage Notes (Signed)
Reports a part that sticks out on his ribs, it does not hurt but it sticks out like a bone spur. Has been ongoing for a while but feels like it has grown over the past month. Smokes 1/2 PPD.

## 2022-01-21 ENCOUNTER — Emergency Department (HOSPITAL_COMMUNITY)
Admission: EM | Admit: 2022-01-21 | Discharge: 2022-01-21 | Discharge status: Against Medical Advice | Payer: Medicare Other

## 2022-01-21 NOTE — ED Notes (Signed)
Per registration, pt stated he's "going to leave and come back at 4 am". Triage RN aware

## 2022-06-02 ENCOUNTER — Other Ambulatory Visit: Payer: Self-pay

## 2022-06-02 ENCOUNTER — Emergency Department (HOSPITAL_COMMUNITY): Payer: Medicare Other

## 2022-06-02 ENCOUNTER — Emergency Department (HOSPITAL_COMMUNITY)
Admission: EM | Admit: 2022-06-02 | Discharge: 2022-06-02 | Disposition: A | Discharge status: Home / Self Care | Payer: Medicare Other | Attending: Emergency Medicine | Admitting: Emergency Medicine

## 2022-06-02 DIAGNOSIS — I10 Essential (primary) hypertension: Secondary | ICD-10-CM | POA: Diagnosis not present

## 2022-06-02 DIAGNOSIS — Z79899 Other long term (current) drug therapy: Secondary | ICD-10-CM | POA: Diagnosis not present

## 2022-06-02 DIAGNOSIS — L03012 Cellulitis of left finger: Secondary | ICD-10-CM | POA: Insufficient documentation

## 2022-06-02 MED ORDER — AMOXICILLIN-POT CLAVULANATE 875-125 MG PO TABS: 1.0000 | ORAL_TABLET | Freq: Two times a day (BID) | ORAL | 0 refills | Status: DC

## 2022-06-02 MED ORDER — OXYCODONE-ACETAMINOPHEN 5-325 MG PO TABS
2.0000 | ORAL_TABLET | Freq: Once | ORAL | Status: AC
  Administered 2022-06-02: 2 via ORAL
  Filled 2022-06-02: qty 2

## 2022-06-02 MED ORDER — LIDOCAINE HCL (PF) 1 % IJ SOLN
30.0000 mL | Freq: Once | INTRAMUSCULAR | Status: AC
  Administered 2022-06-02: 30 mL
  Filled 2022-06-02: qty 30

## 2022-06-02 NOTE — Discharge Instructions (Addendum)
Thank you for letting us take care of you today.  I am starting you on antibiotics to treat your infection. Please soak finger in warm water for 15-20 minutes every few hours for the next few days and allow it to drain. Please be rechecked by your PCP, an urgent care, or in the ED later this week if symptoms are not improving, get worse, or you develop fever, chills, nausea, vomiting, or other worsening, concerning symptoms.

## 2022-06-02 NOTE — ED Triage Notes (Signed)
C/o left pointer finger pain after hitting with metal 3 weeks ago.  Pt reports swelling and started abx 2 days ago that he had at home.  Denies fever, redness.  Tetanus UTD.

## 2022-06-02 NOTE — ED Provider Notes (Signed)
[place] EMERGENCY DEPARTMENT AT [facility] Provider Note   CSN: [id number] Arrival date & time: 06/02/22  7829     History  Chief Complaint  Patient presents with   Finger Injury    Kevin Maxwell is a 72 y.o. male with past medical history hypertension who presents to the ED complaining of left pointer finger injury after accidentally hitting it on a piece of metal 3 weeks ago.  Notes that since that time he has had worsening pain and development of purulent like material under the skin inferior to the nailbed.  Notes he started an unknown antibiotic at home but has not noticed any relief in his symptoms.  No associated fever, chills, nausea, vomiting, difficulty with range of motion of the digits or hand, or other injury or symptoms.  No history of this previously.  Last tetanus vaccination was last year.      Home Medications Prior to Admission medications   Medication Sig Start Date End Date Taking? Authorizing Provider  amoxicillin-clavulanate (AUGMENTIN) 875-125 MG tablet Take 1 tablet by mouth every 12 (twelve) hours for 7 days. 06/02/22 06/09/22 Yes Korver Graybeal L, PA-C  acetaminophen (TYLENOL) 500 MG tablet Take 2 tablets (1,000 mg total) by mouth every 6 (six) hours as needed for moderate pain. 09/24/20   Fayrene Helper, PA-C  albuterol (PROVENTIL HFA;VENTOLIN HFA) 108 (90 Base) MCG/ACT inhaler Inhale 2 puffs into the lungs every 6 (six) hours as needed for wheezing or shortness of breath. 09/22/17   Claiborne Rigg, NP  benzonatate (TESSALON) 100 MG capsule Take 1 capsule (100 mg total) by mouth every 8 (eight) hours. Patient not taking: Reported on 11/19/2020 12/14/18   Lorelee New, PA-C  cyclobenzaprine (FLEXERIL) 10 MG tablet Take 1 tablet (10 mg total) by mouth 3 (three) times daily as needed for muscle spasms. 09/22/17   Claiborne Rigg, NP  diclofenac sodium (VOLTAREN) 1 % GEL Apply 4 g topically 4 (four) times daily. 09/24/20   Fayrene Helper, PA-C  losartan  (COZAAR) 25 MG tablet Take 1 tablet (25 mg total) by mouth daily. 11/19/20   Mayers, Cari S, PA-C      Allergies    Aspirin, Ibuprofen, and Norvasc [amlodipine besylate]    Review of Systems   Review of Systems  All other systems reviewed and are negative.   Physical Exam Updated Vital Signs BP (!) 164/103   Pulse 77   Temp 98 F (36.7 C)   Resp 20   Wt 68 kg   SpO2 98%   BMI 22.14 kg/m  Physical Exam Vitals and nursing note reviewed.  Constitutional:      General: He is not in acute distress.    Appearance: Normal appearance.  HENT:     Head: Normocephalic and atraumatic.     Mouth/Throat:     Mouth: Mucous membranes are moist.  Eyes:     Conjunctiva/sclera: Conjunctivae normal.  Cardiovascular:     Rate and Rhythm: Normal rate and regular rhythm.     Heart sounds: No murmur heard. Pulmonary:     Effort: Pulmonary effort is normal.     Breath sounds: Normal breath sounds.  Abdominal:     General: Abdomen is flat.     Palpations: Abdomen is soft.     Tenderness: There is no abdominal tenderness.  Musculoskeletal:        General: Normal range of motion.     Cervical back: Neck supple.     Comments: Area  of fluctuance with appearance of underlying purulence material just distal to the nailbed of the left second finger, range of motion intact, sensation intact, no obvious nailbed involvement, 2+ radial pulse, mild overlying erythema  Skin:    General: Skin is warm and dry.     Capillary Refill: Capillary refill takes less than 2 seconds.  Neurological:     Mental Status: He is alert. Mental status is at baseline.  Psychiatric:        Behavior: Behavior normal.     ED Results / Procedures / Treatments   Labs (all labs ordered are listed, but only abnormal results are displayed) Labs Reviewed - No data to display  EKG None  Radiology DG Hand 2 View Left  Result Date: 06/02/2022 CLINICAL DATA:  Left finger injury.  Pain after hitting with metal. EXAM: LEFT  HAND - 2 VIEW COMPARISON:  06/26/2021 and 05/23/2011 FINDINGS: Area of concern is the left index finger. The calcified structure along the ulnar aspect of the index finger DIP joint is no longer present since 06/26/2021. Negative for an acute fracture or dislocation. No evidence for a radiopaque foreign body. Chronic flexion deformity at the little finger DIP joint. IMPRESSION: 1. No acute bone abnormality to the left hand. Electronically Signed   By: Richarda OverlieAdam  Henn M.D.   On: 06/02/2022 08:04    Procedures .Marland Kitchen.Incision and Drainage  Date/Time: 06/02/2022 8:30 AM  Performed by: Tonette LedererGowens, Shalayna Ornstein L, PA-C Authorized by: Tonette LedererGowens, Eustace Hur L, PA-C   Consent:    Consent obtained:  Verbal   Consent given by:  Patient   Risks discussed:  Bleeding, incomplete drainage, pain and infection   Alternatives discussed:  No treatment, delayed treatment, alternative treatment and observation Universal protocol:    Procedure explained and questions answered to patient or proxy's satisfaction: yes     Immediately prior to procedure, a time out was called: yes     Patient identity confirmed:  Verbally with patient Location:    Size:  Paronychia   Location:  Upper extremity   Upper extremity location:  Finger   Finger location:  L index finger Pre-procedure details:    Skin preparation:  Povidone-iodine Sedation:    Sedation type:  None Anesthesia:    Anesthesia method:  Local infiltration   Local anesthetic:  Lidocaine 1% w/o epi Procedure type:    Complexity:  Simple Procedure details:    Ultrasound guidance: no     Needle aspiration: no     Incision types:  Single straight   Incision depth:  Subcutaneous   Wound management:  Probed and deloculated and extensive cleaning   Drainage:  Bloody and purulent   Drainage amount:  Scant   Wound treatment:  Wound left open   Packing materials:  None Post-procedure details:    Procedure completion:  Tolerated well, no immediate complications     Medications  Ordered in ED Medications  oxyCODONE-acetaminophen (PERCOCET/ROXICET) 5-325 MG per tablet 2 tablet (2 tablets Oral Given 06/02/22 0807)  lidocaine (PF) (XYLOCAINE) 1 % injection 30 mL (30 mLs Infiltration Given by Other 06/02/22 [id number])    ED Course/ Medical Decision Making/ A&P                             Medical Decision Making Amount and/or Complexity of Data Reviewed Radiology: ordered. Decision-making details documented in ED Course.  Risk Prescription drug management.   Medical Decision Making:   Unice Baileyllen Lippens is  a 72 y.o. male who presented to the ED today with left second finger pain detailed above.    Patient's presentation is complicated by their history of injury to the finger.  Complete initial physical exam performed, notably the patient  was in no acute distress.  Range of motion and neurovascular exam intact.  Area of fluctuance and mild overlying erythema just distal to the nailbed.    Reviewed and confirmed nursing documentation for past medical history, family history, social history.    Initial Assessment:   With the patient's presentation of left finger pain, most likely diagnosis is paronychia. Differential diagnosis includes but is not limited to fracture, dislocation, cellulitis, abscess, cyst.  This is most consistent with an acute complicated illness  Initial Plan:  X-ray to evaluate bony pathology Pain management Incision and drainage Antibiotics Objective evaluation as reviewed   Initial Study Results:   Radiology:  All images reviewed independently. Agree with radiology report at this time.   DG Hand 2 View Left  Result Date: 06/02/2022 CLINICAL DATA:  Left finger injury.  Pain after hitting with metal. EXAM: LEFT HAND - 2 VIEW COMPARISON:  06/26/2021 and 05/23/2011 FINDINGS: Area of concern is the left index finger. The calcified structure along the ulnar aspect of the index finger DIP joint is no longer present since 06/26/2021. Negative for an acute  fracture or dislocation. No evidence for a radiopaque foreign body. Chronic flexion deformity at the little finger DIP joint. IMPRESSION: 1. No acute bone abnormality to the left hand. Electronically Signed   By: Richarda OverlieAdam  Henn M.D.   On: 06/02/2022 08:04    Final Assessment and Plan:   This is a 72 year old old male who presents to the ED following a left second finger injury a few weeks ago with increased pain, swelling, and area of suspected infection.  On exam, he has an area of fluctuance with appearance of underlying purulent material just inferior to the nailbed.  Suspect paronychia.  No associated systemic symptoms including fever, chills, nausea.  Range of motion and neurovascular exam intact.  X-ray negative.  Incision and drainage performed following patient soaking finger in warm water with small amount of drainage of bloody, purulent material.  Will leave incision open and have patient do warm soaks at home and start Augmentin.  Patient instructed to be rechecked later this week for any continued symptoms or return immediately for worsening symptoms.  Strict ED return precautions given, all questions answered, and stable for discharge.   Clinical Impression:  1. Paronychia of finger of left hand      Discharge           Final Clinical Impression(s) / ED Diagnoses Final diagnoses:  Paronychia of finger of left hand    Rx / DC Orders ED Discharge Orders          Ordered    amoxicillin-clavulanate (AUGMENTIN) 875-125 MG tablet  Every 12 hours        06/02/22 0903              Tonette LedererGowens, Sederick Jacobsen L, PA-C 06/02/22 [id number]    Derwood KaplanNanavati, Ankit, MD 06/10/22 0001

## 2022-06-02 NOTE — ED Notes (Signed)
Patient is soaking finger in warm water

## 2022-06-08 ENCOUNTER — Encounter (HOSPITAL_COMMUNITY): Payer: Self-pay

## 2022-06-08 ENCOUNTER — Emergency Department (HOSPITAL_COMMUNITY)
Admission: EM | Admit: 2022-06-08 | Discharge: 2022-06-08 | Disposition: A | Discharge status: Home / Self Care | Payer: Medicare Other | Attending: Emergency Medicine | Admitting: Emergency Medicine

## 2022-06-08 DIAGNOSIS — Z79899 Other long term (current) drug therapy: Secondary | ICD-10-CM | POA: Insufficient documentation

## 2022-06-08 DIAGNOSIS — L03012 Cellulitis of left finger: Secondary | ICD-10-CM

## 2022-06-08 DIAGNOSIS — I10 Essential (primary) hypertension: Secondary | ICD-10-CM | POA: Insufficient documentation

## 2022-06-08 MED ORDER — AMOXICILLIN-POT CLAVULANATE 875-125 MG PO TABS: 1.0000 | ORAL_TABLET | Freq: Two times a day (BID) | ORAL | 0 refills | Status: AC

## 2022-06-08 MED ORDER — LIDOCAINE HCL (PF) 1 % IJ SOLN
10.0000 mL | Freq: Once | INTRAMUSCULAR | Status: DC
  Filled 2022-06-08: qty 30

## 2022-06-08 MED ORDER — AMOXICILLIN-POT CLAVULANATE 875-125 MG PO TABS: 1.0000 | ORAL_TABLET | Freq: Two times a day (BID) | ORAL | 0 refills | Status: DC

## 2022-06-08 NOTE — ED Triage Notes (Addendum)
Pt states that he hit left index finger with metal 1 month ago. Pt c/o pressure and throbbing. XR done 4/8, negative.

## 2022-06-08 NOTE — Discharge Instructions (Signed)
Your paronychia was drained today.  It is very important that you fill and take the antibiotics that you are prescribed by the previous provider.  I have sent them to your requested pharmacy.  Follow-up with your primary care doctor as needed.  In the interim take Tylenol/ibuprofen as needed for pain and use Epsom salt soaks routinely.  Return if development of any new or worsening symptoms.

## 2022-06-08 NOTE — ED Provider Notes (Signed)
EMERGENCY DEPARTMENT AT [facility] Provider Note   CSN: [id number] Arrival date & time: 06/08/22  0710     History  Chief Complaint  Patient presents with   Hand Pain    Kevin Maxwell is a 72 y.o. male.  Patient with history of hypertension presents today with complaints of left pointer finger injury. He states that same occurred several weeks ago when he accidentally hit on a piece of metal. He was seen for same on 4/08 and was found to have a parnoycia which was drained and he was sent home with Augmentin. He states that since then the opening has closed and refilled with purulent drainage. He did not fill the antibiotic because he could not fill it at the time and when he did get the money to fill it the pharmacy told him they didn't have it. He denies fevers, chills, difficulty with range of motion, or pain to the pad of the finger. He is up to date on tetanus.  The history is provided by the patient. No language interpreter was used.  Hand Pain       Home Medications Prior to Admission medications   Medication Sig Start Date End Date Taking? Authorizing Provider  acetaminophen (TYLENOL) 500 MG tablet Take 2 tablets (1,000 mg total) by mouth every 6 (six) hours as needed for moderate pain. 09/24/20   Fayrene Helper, PA-C  albuterol (PROVENTIL HFA;VENTOLIN HFA) 108 (90 Base) MCG/ACT inhaler Inhale 2 puffs into the lungs every 6 (six) hours as needed for wheezing or shortness of breath. 09/22/17   Claiborne Rigg, NP  amoxicillin-clavulanate (AUGMENTIN) 875-125 MG tablet Take 1 tablet by mouth every 12 (twelve) hours for 7 days. 06/02/22 06/09/22  Gowens, Mariah L, PA-C  benzonatate (TESSALON) 100 MG capsule Take 1 capsule (100 mg total) by mouth every 8 (eight) hours. Patient not taking: Reported on 11/19/2020 12/14/18   Lorelee New, PA-C  cyclobenzaprine (FLEXERIL) 10 MG tablet Take 1 tablet (10 mg total) by mouth 3 (three) times daily as needed for muscle  spasms. 09/22/17   Claiborne Rigg, NP  diclofenac sodium (VOLTAREN) 1 % GEL Apply 4 g topically 4 (four) times daily. 09/24/20   Fayrene Helper, PA-C  losartan (COZAAR) 25 MG tablet Take 1 tablet (25 mg total) by mouth daily. 11/19/20   Mayers, Cari S, PA-C      Allergies    Aspirin, Ibuprofen, and Norvasc [amlodipine besylate]    Review of Systems   Review of Systems  Skin:  Positive for wound.  All other systems reviewed and are negative.   Physical Exam Updated Vital Signs BP (!) 131/90   Pulse 83   Temp 97.9 F (36.6 C) (Oral)   Resp 16   Ht 5\' 9"  (1.753 m)   Wt 67.6 kg   SpO2 100%   BMI 22.00 kg/m  Physical Exam Vitals and nursing note reviewed.  Constitutional:      General: He is not in acute distress.    Appearance: Normal appearance. He is normal weight. He is not ill-appearing, toxic-appearing or diaphoretic.  HENT:     Head: Normocephalic and atraumatic.  Cardiovascular:     Rate and Rhythm: Normal rate.  Pulmonary:     Effort: Pulmonary effort is normal. No respiratory distress.  Musculoskeletal:        General: Normal range of motion.     Cervical back: Normal range of motion.  Skin:    General: Skin is  warm and dry.     Comments: Healed incision site to the distal left second finger with area of fluctuance and appearance of underlying purulence just proximal to the nailbed of the left index finger, range of motion intact, sensation intact, 2+ radial pulse, no erythema or warmth.  Capillary refill less than 2 seconds.  No tenderness to palpation of the pad of the finger or the flexor surface of the finger.  Neurological:     General: No focal deficit present.     Mental Status: He is alert.  Psychiatric:        Mood and Affect: Mood normal.        Behavior: Behavior normal.     ED Results / Procedures / Treatments   Labs (all labs ordered are listed, but only abnormal results are displayed) Labs Reviewed - No data to display  EKG None  Radiology No  results found.  Procedures Drain paronychia  Date/Time: 06/08/2022 11:05 AM  Performed by: Silva Bandy, PA-C Authorized by: Silva Bandy, PA-C  Consent: Verbal consent obtained. Risks and benefits: risks, benefits and alternatives were discussed Consent given by: patient Patient understanding: patient states understanding of the procedure being performed Patient consent: the patient's understanding of the procedure matches consent given Procedure consent: procedure consent matches procedure scheduled Relevant documents: relevant documents present and verified Required items: required blood products, implants, devices, and special equipment available Patient identity confirmed: verbally with patient Local anesthesia used: Digital nerve block.  Anesthesia: Local anesthesia used: Digital nerve block. Patient tolerance: patient tolerated the procedure well with no immediate complications       Medications Ordered in ED Medications  lidocaine (PF) (XYLOCAINE) 1 % injection 10 mL (has no administration in time range)    ED Course/ Medical Decision Making/ A&P                             Medical Decision Making Risk Prescription drug management.   This patient is a 72 y.o. male who presents to the ED for concern of left index finger paronychia.   Differential diagnoses prior to evaluation: Paronychia, felon, cellulitis  Past Medical History / Social History / Additional history: Chart reviewed. Pertinent results include: paronychia drained per above procedure with purulence and bloody fluid.  This is the patient's second.  He will drain with the first being on 4/8.  He had an unremarkable x-ray at that time which I have reviewed and interpreted.  He was also started on Augmentin which she has not filled or taken.  Physical Exam: Physical exam performed. The pertinent findings include: consistent with paronychia of the left index finger  Medications /  Treatment: Paronychia drained, digital nerve block   Disposition:  Patient presents today with index finger paronychia.  No signs of flexor tenosynovitis or felon.  Symptoms been drained per procedure with immediate pain relief.  He is afebrile, nontoxic-appearing, and in no acute distress with reassuring vital signs. No signs of systemic infection.  Tetanus up-to-date. Will resend patients Augmentin prescription initially provided by previous provider.  Emphasized importance of taking this medication as prescribed in its entirety.  Also recommend Epsom salt soaks and discontinuing the hydrogen peroxide that he has been using. Evaluation and diagnostic testing in the emergency department does not suggest an emergent condition requiring admission or immediate intervention beyond what has been performed at this time.  Plan for discharge with close PCP follow-up.  Patient  is understanding and amenable with plan, educated on red flag symptoms that would prompt immediate return.  Patient discharged in stable condition.   Final Clinical Impression(s) / ED Diagnoses Final diagnoses:  Paronychia of finger, left    Rx / DC Orders ED Discharge Orders          Ordered    amoxicillin-clavulanate (AUGMENTIN) 875-125 MG tablet  Every 12 hours        06/08/22 1111          An After Visit Summary was printed and given to the patient.     Vear Clock 06/08/22 1112    Mardene Sayer, MD 06/08/22 [id number]

## 2022-08-26 ENCOUNTER — Emergency Department (HOSPITAL_COMMUNITY)
Admission: EM | Admit: 2022-08-26 | Discharge: 2022-08-26 | Disposition: A | Discharge status: Home / Self Care | Payer: Medicare Other | Attending: Emergency Medicine | Admitting: Emergency Medicine

## 2022-08-26 ENCOUNTER — Other Ambulatory Visit: Payer: Self-pay

## 2022-08-26 ENCOUNTER — Encounter (HOSPITAL_COMMUNITY): Payer: Self-pay | Admitting: Emergency Medicine

## 2022-08-26 DIAGNOSIS — Z872 Personal history of diseases of the skin and subcutaneous tissue: Secondary | ICD-10-CM | POA: Diagnosis not present

## 2022-08-26 DIAGNOSIS — K029 Dental caries, unspecified: Secondary | ICD-10-CM | POA: Diagnosis not present

## 2022-08-26 DIAGNOSIS — K0889 Other specified disorders of teeth and supporting structures: Secondary | ICD-10-CM

## 2022-08-26 MED ORDER — PENICILLIN V POTASSIUM 500 MG PO TABS: 500.0000 mg | ORAL_TABLET | Freq: Four times a day (QID) | ORAL | 0 refills | Status: DC

## 2022-08-26 NOTE — ED Provider Notes (Signed)
[place] EMERGENCY DEPARTMENT AT [facility] Provider Note   CSN: [id number] Arrival date & time: 08/26/22  4098     History  Chief Complaint  Patient presents with   Dental Pain    Kevin Maxwell is a 72 y.o. male.  Patient with history of hypertension presents today with complaints of dental pain. He states that same began 2 days ago and has been persistent since. He states that he has an appointment with a dentist but it is not for another month. He denies any fevers, chills, trouble swallowing, or drainage. He also notes that he had a paronychia drained by me in April when he sustained a nail injury and developed an underlying injury. He wants to make sure it is still healing appropriately as it still gives him pain occasionally. Denies any pain currently.    The history is provided by the patient. No language interpreter was used.  Dental Pain      Home Medications Prior to Admission medications   Medication Sig Start Date End Date Taking? Authorizing Provider  acetaminophen (TYLENOL) 500 MG tablet Take 2 tablets (1,000 mg total) by mouth every 6 (six) hours as needed for moderate pain. 09/24/20   Fayrene Helper, PA-C  albuterol (PROVENTIL HFA;VENTOLIN HFA) 108 (90 Base) MCG/ACT inhaler Inhale 2 puffs into the lungs every 6 (six) hours as needed for wheezing or shortness of breath. 09/22/17   Claiborne Rigg, NP  benzonatate (TESSALON) 100 MG capsule Take 1 capsule (100 mg total) by mouth every 8 (eight) hours. Patient not taking: Reported on 11/19/2020 12/14/18   Lorelee New, PA-C  cyclobenzaprine (FLEXERIL) 10 MG tablet Take 1 tablet (10 mg total) by mouth 3 (three) times daily as needed for muscle spasms. 09/22/17   Claiborne Rigg, NP  diclofenac sodium (VOLTAREN) 1 % GEL Apply 4 g topically 4 (four) times daily. 09/24/20   Fayrene Helper, PA-C  losartan (COZAAR) 25 MG tablet Take 1 tablet (25 mg total) by mouth daily. 11/19/20   Mayers, Cari S, PA-C      Allergies     Aspirin, Ibuprofen, and Norvasc [amlodipine besylate]    Review of Systems   Review of Systems  HENT:  Positive for dental problem.   All other systems reviewed and are negative.   Physical Exam Updated Vital Signs BP (!) 143/95   Pulse 75   Temp 98.4 F (36.9 C)   Resp 18   Ht 5\' 9"  (1.753 m)   Wt 67.5 kg   SpO2 100%   BMI 21.98 kg/m  Physical Exam Vitals and nursing note reviewed.  Constitutional:      General: He is not in acute distress.    Appearance: Normal appearance. He is normal weight. He is not ill-appearing, toxic-appearing or diaphoretic.  HENT:     Head: Normocephalic and atraumatic.     Mouth/Throat:     Lips: Pink.     Mouth: Mucous membranes are moist.     Pharynx: Oropharynx is clear. Uvula midline.     Tonsils: No tonsillar exudate or tonsillar abscesses.      Comments: Dentition poor throughout with multiple obvious dental caries. Small area of fluctuance to the roof of the mouth with no induration or drainage.   No facial swelling or gross abscess. No swelling under the tongue or signs of Ludwigs angina. Cardiovascular:     Rate and Rhythm: Normal rate.  Pulmonary:     Effort: Pulmonary effort is normal. No  respiratory distress.  Musculoskeletal:        General: Normal range of motion.     Cervical back: Normal range of motion and neck supple. No rigidity or tenderness.     Comments: Left index finger with no TTP, no fluctuance, induration, erythema, or warmth. ROM intact with no pain. Capillary refill less than 2 seconds. Nail is growing back but does not yet completely cover the nailbed.   Lymphadenopathy:     Cervical: No cervical adenopathy.  Skin:    General: Skin is warm and dry.  Neurological:     General: No focal deficit present.     Mental Status: He is alert.  Psychiatric:        Mood and Affect: Mood normal.        Behavior: Behavior normal.     ED Results / Procedures / Treatments   Labs (all labs ordered are listed, but  only abnormal results are displayed) Labs Reviewed - No data to display  EKG None  Radiology No results found.  Procedures Procedures    Medications Ordered in ED Medications - No data to display  ED Course/ Medical Decision Making/ A&P                             Medical Decision Making  Patient presents today with complaints of dental pain x 2 days. He is afebrile, non-toxic appearing, and in no acute distress with reassuring vital signs. Physical exam reveals dentition poor throughout the mouth with multiple dental caries.  There does appear to be a small developing abscess to the roof of his mouth. I offered incision and drainage of same which patient declined, states he would prefer to have antibiotics and see his dentist for management. Patient informed to return if this does not improve with antibiotics or worsens. Exam unconcerning for Ludwig's angina or spread of infection.  Will treat with penicillin and anti-inflammatories medicine.  Urged patient to follow-up with dentist. Patient also requested recheck of his finger where I actually drained his paronychia back in April 2024. The finger does not show any sings of infection at this time. He denies any pain currently, he just states that it bothers him from time to time. Suspect this is due to the fingernail injury he had prior to having the paronychia drained. He nail has yet to completely recover the nailbed. Recommend close monitoring for same and outpatient follow-up for this. Evaluation and diagnostic testing in the emergency department does not suggest an emergent condition requiring admission or immediate intervention beyond what has been performed at this time.  Plan for discharge with close PCP follow-up.  Patient is understanding and amenable with plan, educated on red flag symptoms that would prompt immediate return.  Patient discharged in stable condition.   Final Clinical Impression(s) / ED Diagnoses Final diagnoses:   Pain, dental  History of paronychia of finger    Rx / DC Orders ED Discharge Orders          Ordered    penicillin v potassium (VEETID) 500 MG tablet  4 times daily        08/26/22 0815          An After Visit Summary was printed and given to the patient.     Vear Clock 08/26/22 1610    Arby Barrette, MD 08/26/22 1324

## 2022-08-26 NOTE — Discharge Instructions (Addendum)
You were seen in the emergency department for dental pain.  As we discussed I think your pain is likely related to a cavity. We normally treat this with anti-inflammatories and antibiotics.   I've attached a resource guide with several dentists in the area. It's incredibly important you follow up with a dentist as soon as possible for definitive treatment.   Continue to monitor how you're doing and return to the ER for new or worsening symptoms such as difficulty swallowing your own saliva, difficulty breathing, or fever.   

## 2022-08-26 NOTE — ED Triage Notes (Signed)
Pt c/o dental abscess x 2 days. Pt also c/o intermittent left index finger pain since injury last month to same finger.

## 2022-08-27 ENCOUNTER — Telehealth (HOSPITAL_COMMUNITY): Payer: Self-pay | Admitting: Emergency Medicine

## 2022-08-27 MED ORDER — PENICILLIN V POTASSIUM 500 MG PO TABS: 500.0000 mg | ORAL_TABLET | Freq: Four times a day (QID) | ORAL | 0 refills | Status: AC

## 2022-12-02 ENCOUNTER — Other Ambulatory Visit: Payer: Self-pay

## 2022-12-02 ENCOUNTER — Encounter: Payer: Self-pay | Admitting: Family Medicine

## 2022-12-02 ENCOUNTER — Ambulatory Visit: Payer: Medicare Other | Admitting: Family Medicine

## 2022-12-02 ENCOUNTER — Ambulatory Visit (INDEPENDENT_AMBULATORY_CARE_PROVIDER_SITE_OTHER): Payer: Medicare Other

## 2022-12-02 VITALS — BP 124/78 | HR 75 | Ht 69.0 in | Wt 160.0 lb

## 2022-12-02 DIAGNOSIS — M25562 Pain in left knee: Secondary | ICD-10-CM

## 2022-12-02 DIAGNOSIS — M25511 Pain in right shoulder: Secondary | ICD-10-CM | POA: Diagnosis not present

## 2022-12-02 DIAGNOSIS — G8929 Other chronic pain: Secondary | ICD-10-CM | POA: Diagnosis not present

## 2022-12-02 DIAGNOSIS — M25512 Pain in left shoulder: Secondary | ICD-10-CM

## 2022-12-02 DIAGNOSIS — M19011 Primary osteoarthritis, right shoulder: Secondary | ICD-10-CM | POA: Diagnosis not present

## 2022-12-02 DIAGNOSIS — M25561 Pain in right knee: Secondary | ICD-10-CM

## 2022-12-02 DIAGNOSIS — M778 Other enthesopathies, not elsewhere classified: Secondary | ICD-10-CM | POA: Diagnosis not present

## 2022-12-02 DIAGNOSIS — M19012 Primary osteoarthritis, left shoulder: Secondary | ICD-10-CM | POA: Diagnosis not present

## 2022-12-02 NOTE — Patient Instructions (Addendum)
Thank you for coming in today.   Call or go to the ER if you develop a large red swollen joint with extreme pain or oozing puss.    Please get an Xray today before you leave   Recheck in 1 week and plan for knee injection then.   Damien Rodulfo at [facility] - [email or web address]

## 2022-12-02 NOTE — Progress Notes (Signed)
I, Kevin Maxwell, CMA acting as a scribe for Kevin Graham, MD.  Kevin Maxwell is a 72 y.o. male who presents to [facility] at [facility] today for shoulder and knee pain. Pt was previously seen at [facility] on 11/20/20.  Today, pt c/o shoulder pain x 3 years, injured while lifting something heavy. Notes mechanical sx since that time. Notes worsening shoulder pain over the past 2 weeks, getting progressively worse. Pt locates pain to the top of the shoulders. Notes limited ROM in the right shoulder when reaching back. Has to use left arm to support right arm while shaving. Notes n/t in both hands. Also notes neck pain and HA associated with neck pain.   Radiates: no Aggravates: lifting, works as a Scientist, water quality, retired Treatments tried: Aleve, Publishing copy  Pt also c/o LEFT knee pain x several months. Locates pain distally to patella.   Knee swelling: intermittent Mechanical symptoms: over the past couple of weeks Radiates: no Aggravates: WB Treatments tried: Aleve  Dx imaging: 12/27/20 L shoulder MRI  11/20/20 L shoulder & L knee XR  Pertinent review of systems: No fevers or chills  Relevant historical information: Hypertension History of low shoulder rotator cuff tear.  Exam:  BP 124/78   Pulse 75   Ht 5\' 9"  (1.753 m)   Wt 160 lb (72.6 kg)   SpO2 96%   BMI 23.63 kg/m  General: Well Developed, well nourished, and in no acute distress.   MSK: Bilateral shoulders normal-appearing Tender palpation AC joint.  Pain with crossover arm compression test. Left shoulder abduction strength mildly limited otherwise strength intact bilaterally.  Knee bilaterally minimal effusion normal motion with crepitation.    Lab and Radiology Results  Procedure: Real-time Ultrasound Guided Injection of left shoulder AC joint Device: Philips Affiniti 50G/GE Logiq Images permanently stored and available for review in PACS Verbal informed consent obtained.  Discussed  risks and benefits of procedure. Warned about infection, bleeding, hyperglycemia damage to structures among others. Patient expresses understanding and agreement Time-out conducted.   Noted no overlying erythema, induration, or other signs of local infection.   Skin prepped in a sterile fashion.   Local anesthesia: Topical Ethyl chloride.   With sterile technique and under real time ultrasound guidance: 40 mg of Kenalog and 2 mL's of Marcaine injected into AC joint. Fluid seen entering the joint capsule.   Completed without difficulty   Pain immediately resolved suggesting accurate placement of the medication.   Advised to call if fevers/chills, erythema, induration, drainage, or persistent bleeding.   Images permanently stored and available for review in the ultrasound unit.  Impression: Technically successful ultrasound guided injection.    Procedure: Real-time Ultrasound Guided Injection of right shoulder AC joint Device: Philips Affiniti 50G/GE Logiq Images permanently stored and available for review in PACS Verbal informed consent obtained.  Discussed risks and benefits of procedure. Warned about infection, bleeding, hyperglycemia damage to structures among others. Patient expresses understanding and agreement Time-out conducted.   Noted no overlying erythema, induration, or other signs of local infection.   Skin prepped in a sterile fashion.   Local anesthesia: Topical Ethyl chloride.   With sterile technique and under real time ultrasound guidance: 40 mg of Kenalog and 2 mL's of Marcaine injected into AC joint. Fluid seen entering the joint capsule.   Completed without difficulty   Pain immediately resolved suggesting accurate placement of the medication.   Advised to call if fevers/chills, erythema, induration, drainage, or persistent bleeding.  Images permanently stored and available for review in the ultrasound unit.  Impression: Technically successful ultrasound guided  injection.   X-ray images bilateral shoulders and bilateral knees obtained today personally independently interpreted.  Right shoulder: Mild AC DJD.  No severe glenohumeral DJD.  Some calcification present at the superior humeral head near the insertion of the rotator cuff tendons.  Left shoulder: Mild glenohumeral and AC DJD.  Calcification present at rotator cuff insertion humeral head.  Right knee: Minimal degenerative changes.  No acute fractures.  Left knee: Minimal medial compartment DJD.  No acute fractures are visible.  Await formal radiology review    Assessment and Plan: 72 y.o. male with bilateral shoulder pain.  Pain is primarily located at the [facility] joint.  He had good response to injection in clinic today.  Could consider glenohumeral or even subacromial injections in the future.  Additionally he has bilateral knee pain.  X-rays do not show much arthritis.  He may be a good candidate for trial of injection along with some Voltaren gel.  He will return as early as next week to consider knee injections if needed.   PDMP not reviewed this encounter. Orders Placed This Encounter  Procedures   [place] LIMITED JOINT SPACE STRUCTURES UP BILAT(NO LINKED CHARGES)    Order Specific Question:   Reason for Exam (SYMPTOM  OR DIAGNOSIS REQUIRED)    Answer:   bilat shoulder pain    Order Specific Question:   Preferred imaging location?    Answer:   [facility] Knee AP/LAT W/Sunrise Left    Standing Status:   Future    Number of Occurrences:   1    Standing Expiration Date:   01/02/2023    Order Specific Question:   Reason for Exam (SYMPTOM  OR DIAGNOSIS REQUIRED)    Answer:   bilateral knee pain    Order Specific Question:   Preferred imaging location?    Answer:   [facility] Knee AP/LAT W/Sunrise Right    Standing Status:   Future    Number of Occurrences:   1    Standing Expiration Date:   01/02/2023    Order Specific Question:   Reason  for Exam (SYMPTOM  OR DIAGNOSIS REQUIRED)    Answer:   bilateral knee pain    Order Specific Question:   Preferred imaging location?    Answer:   [facility] Shoulder Left    Standing Status:   Future    Number of Occurrences:   1    Standing Expiration Date:   01/02/2023    Order Specific Question:   Reason for Exam (SYMPTOM  OR DIAGNOSIS REQUIRED)    Answer:   bilateral shoulder pain    Order Specific Question:   Preferred imaging location?    Answer:   [facility] Shoulder Right    Standing Status:   Future    Number of Occurrences:   1    Standing Expiration Date:   12/02/2023    Order Specific Question:   Reason for Exam (SYMPTOM  OR DIAGNOSIS REQUIRED)    Answer:   bilateral shoulder pain    Order Specific Question:   Preferred imaging location?    Answer:   [facility]   No orders of the defined types were placed in this encounter.    Discussed warning signs or symptoms. Please see discharge instructions. Patient expresses  understanding.   The above documentation has been reviewed and is accurate and complete Kevin Maxwell, M.D.

## 2022-12-08 NOTE — Progress Notes (Unsigned)
   Rubin Payor, PhD, LAT, ATC acting as a scribe for Clementeen Graham, MD.  Kevin Maxwell is a 72 y.o. male who presents to [facility] at [facility] today for 1-wk f/u bilat knee and shoulder pain. Pt was last seen by Dr. Denyse Amass on 12/02/22 and was given bilat AC joint steroid injections and was advised to return in 1-wk to consider possible additional steroid injections.  Today, pt reports ***   Dx imaging: 12/02/22 R & L shoulder XR and R & L knee XR 12/27/20 L shoulder MRI             11/20/20 L shoulder & L knee XR  Pertinent review of systems: ***  Relevant historical information: ***   Exam:  There were no vitals taken for this visit. General: Well Developed, well nourished, and in no acute distress.   MSK: ***    Lab and Radiology Results No results found for this or any previous visit (from the past 72 hour(s)). No results found.     Assessment and Plan: 72 y.o. male with ***   PDMP not reviewed this encounter. No orders of the defined types were placed in this encounter.  No orders of the defined types were placed in this encounter.    Discussed warning signs or symptoms. Please see discharge instructions. Patient expresses understanding.   ***

## 2022-12-09 ENCOUNTER — Other Ambulatory Visit: Payer: Self-pay

## 2022-12-09 ENCOUNTER — Ambulatory Visit: Payer: Medicare Other | Admitting: Family Medicine

## 2022-12-09 ENCOUNTER — Encounter: Payer: Self-pay | Admitting: Family Medicine

## 2022-12-09 VITALS — BP 136/84 | HR 70 | Ht 69.0 in | Wt 160.0 lb

## 2022-12-09 DIAGNOSIS — M25561 Pain in right knee: Secondary | ICD-10-CM

## 2022-12-09 DIAGNOSIS — M25562 Pain in left knee: Secondary | ICD-10-CM | POA: Diagnosis not present

## 2022-12-09 DIAGNOSIS — G8929 Other chronic pain: Secondary | ICD-10-CM

## 2022-12-09 NOTE — Patient Instructions (Signed)
Thank you for coming in today.  You received an injection today. Seek immediate medical attention if the joint becomes red, extremely painful, or is oozing fluid.  Try Tylenol Arthritis and Turmeric

## 2022-12-24 NOTE — Progress Notes (Signed)
Right shoulder x-ray shows some mild arthritis

## 2022-12-24 NOTE — Progress Notes (Signed)
Left shoulder x-ray shows some mild arthritis

## 2022-12-25 NOTE — Progress Notes (Signed)
Left knee x-ray looks normal to radiology

## 2022-12-25 NOTE — Progress Notes (Signed)
Right knee x-ray looks normal to radiology

## 2023-05-10 ENCOUNTER — Emergency Department (HOSPITAL_COMMUNITY)
Admission: EM | Admit: 2023-05-10 | Discharge: 2023-05-10 | Disposition: A | Discharge status: Home / Self Care | Attending: Student | Admitting: Student

## 2023-05-10 DIAGNOSIS — K0889 Other specified disorders of teeth and supporting structures: Secondary | ICD-10-CM | POA: Insufficient documentation

## 2023-05-10 MED ORDER — AMOXICILLIN-POT CLAVULANATE 875-125 MG PO TABS: 1.0000 | ORAL_TABLET | Freq: Two times a day (BID) | ORAL | 0 refills | Status: AC

## 2023-05-10 MED ORDER — KETOROLAC TROMETHAMINE 15 MG/ML IJ SOLN
15.0000 mg | Freq: Once | INTRAMUSCULAR | Status: AC
  Administered 2023-05-10: 15 mg via INTRAMUSCULAR
  Filled 2023-05-10: qty 1

## 2023-05-10 NOTE — ED Provider Notes (Signed)
 [facility]ERGENC[facility] DEPARTMENT AT [facility] Provider Note   CSN: [id number] Arrival date & time: 05/10/23  0109     History  Chief Complaint  Patient presents with   Dental Pain    Kevin Maxwell is a 73 y.o. male.  14-year-old male presents today for concern of a dental abscess.  He states he has had quite a bit of pain in the past couple days.  He states he is waiting to have dental implants placed but he is unable to get in with his dentist right away.  He states he noticed some internal swelling.  He denies any difficulty swallowing, difficulty breathing, denies any fever or drainage.  The history is provided by the patient. No language interpreter was used.       Home Medications Prior to Admission medications   Medication Sig Start Date End Date Taking? Authorizing Provider  acetaminophen (TYLENOL) 500 MG tablet Take 2 tablets (1,000 mg total) by mouth every 6 (six) hours as needed for moderate pain. 09/24/20   Fayrene Helper, PA-C  albuterol (PROVENTIL HFA;VENTOLIN HFA) 108 (90 Base) MCG/ACT inhaler Inhale 2 puffs into the lungs every 6 (six) hours as needed for wheezing or shortness of breath. 09/22/17   Claiborne Rigg, NP  benzonatate (TESSALON) 100 MG capsule Take 1 capsule (100 mg total) by mouth every 8 (eight) hours. 12/14/18   Lorelee New, PA-C  cyclobenzaprine (FLEXERIL) 10 MG tablet Take 1 tablet (10 mg total) by mouth 3 (three) times daily as needed for muscle spasms. 09/22/17   Claiborne Rigg, NP  diclofenac sodium (VOLTAREN) 1 % GEL Apply 4 g topically 4 (four) times daily. 09/24/20   Fayrene Helper, PA-C  losartan (COZAAR) 25 MG tablet Take 1 tablet (25 mg total) by mouth daily. 11/19/20   Mayers, Cari S, PA-C      Allergies    Aspirin, Ibuprofen, and Norvasc [amlodipine besylate]    Review of Systems   Review of Systems  Constitutional:  Negative for chills and fever.  HENT:  Positive for dental problem. Negative for sore throat, trouble swallowing  and voice change.   Respiratory:  Negative for cough and shortness of breath.   Neurological:  Negative for light-headedness.  All other systems reviewed and are negative.   Physical Exam Updated Vital Signs BP (!) 137/92 (BP Location: Left Arm)   Pulse 81   Temp 97.8 F (36.6 C) (Oral)   Resp 15   SpO2 100%  Physical Exam Vitals and nursing note reviewed.  Constitutional:      General: He is not in acute distress.    Appearance: Normal appearance. He is not ill-appearing.  HENT:     Head: Normocephalic and atraumatic.     Nose: Nose normal.     Mouth/Throat:     Comments: No evidence of dental abscess, retropharyngeal abscess, peritonsillar abscess, or Ludwig's angina.  Overall patient does have poor dentition. Eyes:     Conjunctiva/sclera: Conjunctivae normal.  Pulmonary:     Effort: Pulmonary effort is normal. No respiratory distress.  Musculoskeletal:        General: No deformity.  Skin:    Findings: No rash.  Neurological:     Mental Status: He is alert.     ED Results / Procedures / Treatments   Labs (all labs ordered are listed, but only abnormal results are displayed) Labs Reviewed - No data to display  EKG None  Radiology No results found.  Procedures Procedures  Medications Ordered in ED Medications - No data to display  ED Course/ Medical Decision Making/ A&P                                 Medical Decision Making  73 year old male presents today for concern of dental abscess.  No dental abscess appreciated on exam.  Also no evidence of retropharyngeal abscess, peritonsillar abscess, or Ludwig's angina.  Toradol given in the emergency department.  Antibiotics prescribed.  Discussed taking Tylenol and ibuprofen for pain control and following up with his dentist.  He is in agreement.  Discharged in stable condition.   Final Clinical Impression(s) / ED Diagnoses Final diagnoses:  Pain, dental    Rx / DC Orders ED Discharge Orders           Ordered    amoxicillin-clavulanate (AUGMENTIN) 875-125 MG tablet  Every 12 hours        05/10/23 0828              Marita Kansas, PA-C 05/10/23 0831    Glendora Score, MD 05/10/23 2117

## 2023-05-10 NOTE — ED Triage Notes (Signed)
 Pt states that he recently cracked a tooth in the R upper part of his jaw and he tried pulling it thereafter. Now he believes he may have an abscess. No recent fevers, trouble breathing or swallowing.

## 2023-05-10 NOTE — Discharge Instructions (Addendum)
 Follow-up with your dentist.  Antibiotic sent into your pharmacy.  Take Tylenol for pain control.  Use ibuprofen as you need to for pain control as well.  Return for any emergent symptoms.

## 2023-05-27 ENCOUNTER — Telehealth: Payer: Self-pay | Admitting: Family Medicine

## 2023-05-27 NOTE — Telephone Encounter (Signed)
 Patient called asking if he would be able to have gel or zilretta injections? Please advise.

## 2023-05-27 NOTE — Telephone Encounter (Signed)
 Forwarding to Dr. Denyse Amass to review and advise. Did not see mention of Zilretta or gel shot in last 2 visit notes.

## 2023-05-28 NOTE — Telephone Encounter (Signed)
 Please work on authorization for [employer] injections and please let Gearldine Bienenstock or Dorathy Daft know when they are authorized so we can schedule.  Please inform patient that we are working on it.

## 2023-05-28 NOTE — Telephone Encounter (Signed)
 Can you schedule patient when we have medication stocked. Thank you  Zilretta authorized for bilateral knee   No PA, medical notes or referrals needed.  Plan follows Medicare guidelines. Patient responsibility for [phone] Kevin Maxwell) will be 20%  with the remaining covered at 80% by the payer at the contracted rate.  Patient is responsible for a $30 copay for CPT code 60454 with the remaining covered at 100% by the payer at contracted rate.  Deductibles do not apply to these services.  Patient has a $30 copay whether or not an office visit is billed.   Only one copay applies per date of service.  Patient has an out of pocket maximum of $4900 and has accumulated $125.   If out of pocket is met, coverage goes to 100% and copays will no longer apply

## 2023-05-28 NOTE — Telephone Encounter (Signed)
 Ran benefits for zilretta in flex forward

## 2023-05-29 NOTE — Telephone Encounter (Signed)
Spoke to patient. He will call back to schedule

## 2023-05-29 NOTE — Telephone Encounter (Signed)
 Patient called stating that he was interested in one that would last longer (like gel).  Can we run him for that too?

## 2023-06-01 NOTE — Telephone Encounter (Signed)
 Ran Patient for benefits on Durolane and [employer]

## 2023-06-05 ENCOUNTER — Telehealth: Payer: Self-pay

## 2023-06-05 NOTE — Telephone Encounter (Signed)
 Can you schedule patient once the medication is stocked. Thank you   Unk Lightning authorized for bilateral knee Plan covers 80% of the allowable amount of Gelsyn  Plan covers 100% of procedure 20611/20610 Deductible does not apply Patient has a $30 copay whether or not an office visit it billed or not Only once copay per DOS  Once OOP has been met coverage goes to 100% and copay will no longer apply No pre cert or referrals needed Reference # [id number]  EXP: 12/01/2023

## 2023-06-08 NOTE — Telephone Encounter (Signed)
 Scheduled

## 2023-06-15 NOTE — Progress Notes (Unsigned)
   Joanna Muck, PhD, LAT, ATC acting as a scribe for Garlan Juniper, MD.  Kevin Maxwell is a 73 y.o. male who presents to [facility] at [facility] today for cont'd bilat knee pain and to start the Gelsyn series. Pt was last seen by Dr. Alease Hunter on 12/09/22 and was given bilat knee steroid injections  Today, pt reports ***  Dx imaging: 12/02/22 R & L knee XR  Pertinent review of systems: ***  Relevant historical information: ***   Exam:  There were no vitals taken for this visit. General: Well Developed, well nourished, and in no acute distress.   MSK: ***    Lab and Radiology Results No results found for this or any previous visit (from the past 72 hours). No results found.     Assessment and Plan: 73 y.o. male with ***   PDMP not reviewed this encounter. No orders of the defined types were placed in this encounter.  No orders of the defined types were placed in this encounter.    Discussed warning signs or symptoms. Please see discharge instructions. Patient expresses understanding.   ***

## 2023-06-16 ENCOUNTER — Ambulatory Visit (INDEPENDENT_AMBULATORY_CARE_PROVIDER_SITE_OTHER): Admitting: Family Medicine

## 2023-06-16 ENCOUNTER — Other Ambulatory Visit: Payer: Self-pay

## 2023-06-16 ENCOUNTER — Encounter: Payer: Self-pay | Admitting: Family Medicine

## 2023-06-16 VITALS — BP 118/82 | HR 83 | Ht 69.0 in | Wt 160.0 lb

## 2023-06-16 DIAGNOSIS — G8929 Other chronic pain: Secondary | ICD-10-CM

## 2023-06-16 DIAGNOSIS — M25562 Pain in left knee: Secondary | ICD-10-CM | POA: Diagnosis not present

## 2023-06-16 DIAGNOSIS — M25561 Pain in right knee: Secondary | ICD-10-CM

## 2023-06-16 DIAGNOSIS — M170 Bilateral primary osteoarthritis of knee: Secondary | ICD-10-CM | POA: Diagnosis not present

## 2023-06-16 MED ORDER — SODIUM HYALURONATE (VISCOSUP) 16.8 MG/2ML IX SOSY
16.8000 mg | PREFILLED_SYRINGE | Freq: Once | INTRA_ARTICULAR | Status: AC
Start: 2023-06-16 — End: 2023-06-16
  Administered 2023-06-16: 16.8 mg via INTRA_ARTICULAR

## 2023-06-16 NOTE — Patient Instructions (Addendum)
 Thank you for coming in today.   You received an injection today. Seek immediate medical attention if the joint becomes red, extremely painful, or is oozing fluid.   See you back in 1 week for Gelsyn #2

## 2023-06-23 ENCOUNTER — Ambulatory Visit: Admitting: Family Medicine

## 2023-06-25 ENCOUNTER — Other Ambulatory Visit: Payer: Self-pay

## 2023-06-25 ENCOUNTER — Ambulatory Visit (INDEPENDENT_AMBULATORY_CARE_PROVIDER_SITE_OTHER): Admitting: Family Medicine

## 2023-06-25 DIAGNOSIS — M25561 Pain in right knee: Secondary | ICD-10-CM

## 2023-06-25 DIAGNOSIS — M170 Bilateral primary osteoarthritis of knee: Secondary | ICD-10-CM

## 2023-06-25 DIAGNOSIS — M25562 Pain in left knee: Secondary | ICD-10-CM

## 2023-06-25 DIAGNOSIS — G8929 Other chronic pain: Secondary | ICD-10-CM

## 2023-06-25 MED ORDER — SODIUM HYALURONATE (VISCOSUP) 16.8 MG/2ML IX SOSY
16.8000 mg | PREFILLED_SYRINGE | Freq: Once | INTRA_ARTICULAR | Status: AC
Start: 2023-06-25 — End: 2023-06-25
  Administered 2023-06-25: 16.8 mg via INTRA_ARTICULAR

## 2023-06-25 NOTE — Progress Notes (Signed)
 Sparsh Keast presents to clinic today for Gelsyn injection bilateral knee 2/3 Procedure: Real-time Ultrasound Guided Injection of right knee joint superior lateral patellar space Device: Philips Affiniti 50G/GE Logiq Images permanently stored and available for review in PACS Verbal informed consent obtained.  Discussed risks and benefits of procedure. Warned about infection, bleeding, damage to structures among others. Patient expresses understanding and agreement Time-out conducted.   Noted no overlying erythema, induration, or other signs of local infection.   Skin prepped in a sterile fashion.   Local anesthesia: Topical Ethyl chloride.   With sterile technique and under real time ultrasound guidance: Gelsyn 2 mL injected into knee joint. Fluid seen entering the joint capsule.   Completed without difficulty   Advised to call if fevers/chills, erythema, induration, drainage, or persistent bleeding.   Images permanently stored and available for review in the ultrasound unit.  Impression: Technically successful ultrasound guided injection.  Procedure: Real-time Ultrasound Guided Injection of left knee joint superior lateral patellar space Device: Philips Affiniti 50G/GE Logiq Images permanently stored and available for review in PACS Verbal informed consent obtained.  Discussed risks and benefits of procedure. Warned about infection, bleeding, damage to structures among others. Patient expresses understanding and agreement Time-out conducted.   Noted no overlying erythema, induration, or other signs of local infection.   Skin prepped in a sterile fashion.   Local anesthesia: Topical Ethyl chloride.   With sterile technique and under real time ultrasound guidance: Gelsyn 2 mL injected into knee joint. Fluid seen entering the joint capsule.   Completed without difficulty   Advised to call if fevers/chills, erythema, induration, drainage, or persistent bleeding.   Images permanently stored and  available for review in the ultrasound unit.  Impression: Technically successful ultrasound guided injection. Lot number: [id number] both knees

## 2023-06-25 NOTE — Patient Instructions (Signed)
 Thank you for coming in today.   You received an injection today. Seek immediate medical attention if the joint becomes red, extremely painful, or is oozing fluid.

## 2023-06-30 ENCOUNTER — Other Ambulatory Visit: Payer: Self-pay

## 2023-06-30 ENCOUNTER — Ambulatory Visit (INDEPENDENT_AMBULATORY_CARE_PROVIDER_SITE_OTHER): Admitting: Family Medicine

## 2023-06-30 DIAGNOSIS — M170 Bilateral primary osteoarthritis of knee: Secondary | ICD-10-CM

## 2023-06-30 MED ORDER — SODIUM HYALURONATE (VISCOSUP) 16.8 MG/2ML IX SOSY
16.8000 mg | PREFILLED_SYRINGE | Freq: Once | INTRA_ARTICULAR | Status: AC
Start: 2023-06-30 — End: 2023-06-30
  Administered 2023-06-30: 16.8 mg via INTRA_ARTICULAR

## 2023-06-30 NOTE — Patient Instructions (Signed)
 Thank you for coming in today.   You received an injection today. Seek immediate medical attention if the joint becomes red, extremely painful, or is oozing fluid.   Check back as needed

## 2023-06-30 NOTE — Progress Notes (Signed)
 Kevin Maxwell presents to clinic today for Gelsyn injection bilateral knee 3/3 Procedure: Real-time Ultrasound Guided Injection of right knee joint superior lateral patellar space Device: Philips Affiniti 50G/GE Logiq Images permanently stored and available for review in PACS Verbal informed consent obtained.  Discussed risks and benefits of procedure. Warned about infection, bleeding, damage to structures among others. Patient expresses understanding and agreement Time-out conducted.   Noted no overlying erythema, induration, or other signs of local infection.   Skin prepped in a sterile fashion.   Local anesthesia: Topical Ethyl chloride.   With sterile technique and under real time ultrasound guidance: Gelsyn 2 mL injected into knee joint. Fluid seen entering the joint capsule.   Completed without difficulty   Advised to call if fevers/chills, erythema, induration, drainage, or persistent bleeding.   Images permanently stored and available for review in the ultrasound unit.  Impression: Technically successful ultrasound guided injection.  Procedure: Real-time Ultrasound Guided Injection of left knee joint superior lateral patellar space Device: Philips Affiniti 50G/GE Logiq Images permanently stored and available for review in PACS Verbal informed consent obtained.  Discussed risks and benefits of procedure. Warned about infection, bleeding, damage to structures among others. Patient expresses understanding and agreement Time-out conducted.   Noted no overlying erythema, induration, or other signs of local infection.   Skin prepped in a sterile fashion.   Local anesthesia: Topical Ethyl chloride.   With sterile technique and under real time ultrasound guidance: Gelsyn 2 mL injected into knee joint. Fluid seen entering the joint capsule.   Completed without difficulty   Advised to call if fevers/chills, erythema, induration, drainage, or persistent bleeding.   Images permanently stored and  available for review in the ultrasound unit.  Impression: Technically successful ultrasound guided injection. Lot number: [id number] Return as needed

## 2023-09-01 LAB — COLOGUARD: COLOGUARD: NEGATIVE

## 2023-10-23 ENCOUNTER — Emergency Department (HOSPITAL_COMMUNITY)

## 2023-10-23 ENCOUNTER — Encounter (HOSPITAL_COMMUNITY): Payer: Self-pay | Admitting: Emergency Medicine

## 2023-10-23 ENCOUNTER — Emergency Department (HOSPITAL_COMMUNITY)
Admission: EM | Admit: 2023-10-23 | Discharge: 2023-10-23 | Disposition: A | Discharge status: Home / Self Care | Attending: Emergency Medicine | Admitting: Emergency Medicine

## 2023-10-23 ENCOUNTER — Other Ambulatory Visit: Payer: Self-pay

## 2023-10-23 DIAGNOSIS — M79644 Pain in right finger(s): Secondary | ICD-10-CM | POA: Diagnosis present

## 2023-10-23 DIAGNOSIS — R519 Headache, unspecified: Secondary | ICD-10-CM | POA: Diagnosis not present

## 2023-10-23 LAB — COMPREHENSIVE METABOLIC PANEL WITH GFR
ALT: 16 U/L (ref 0–44)
AST: 22 U/L (ref 15–41)
Albumin: 4.3 g/dL (ref 3.5–5.0)
Alkaline Phosphatase: 46 U/L (ref 38–126)
Anion gap: 8 (ref 5–15)
BUN: 9 mg/dL (ref 8–23)
CO2: 27 mmol/L (ref 22–32)
Calcium: 8.9 mg/dL (ref 8.9–10.3)
Chloride: 104 mmol/L (ref 98–111)
Creatinine, Ser: 0.77 mg/dL (ref 0.61–1.24)
GFR, Estimated: >60 mL/min (ref >60)
Glucose, Bld: 105 mg/dL — ABNORMAL HIGH (ref 70–99)
Potassium: 5.1 mmol/L (ref 3.5–5.1)
Sodium: 139 mmol/L (ref 135–145)
Total Bilirubin: 0.9 mg/dL (ref 0.0–1.2)
Total Protein: 7.3 g/dL (ref 6.5–8.1)

## 2023-10-23 LAB — CBC WITH DIFFERENTIAL/PLATELET
Abs Immature Granulocytes: 0.01 K/uL (ref 0.00–0.07)
Basophils Absolute: 0 K/uL (ref 0.0–0.1)
Basophils Relative: 1 %
Eosinophils Absolute: 0.1 K/uL (ref 0.0–0.5)
Eosinophils Relative: 3 %
HCT: 45.3 % (ref 39.0–52.0)
Hemoglobin: 15 g/dL (ref 13.0–17.0)
Immature Granulocytes: 0 %
Lymphocytes Relative: 40 %
Lymphs Abs: 2.3 K/uL (ref 0.7–4.0)
MCH: 30.5 pg (ref 26.0–34.0)
MCHC: 33.1 g/dL (ref 30.0–36.0)
MCV: 92.3 fL (ref 80.0–100.0)
Monocytes Absolute: 0.6 K/uL (ref 0.1–1.0)
Monocytes Relative: 10 %
Neutro Abs: 2.7 K/uL (ref 1.7–7.7)
Neutrophils Relative %: 46 %
Platelets: 273 K/uL (ref 150–400)
RBC: 4.91 MIL/uL (ref 4.22–5.81)
RDW: 15 % (ref 11.5–15.5)
WBC: 5.7 K/uL (ref 4.0–10.5)
nRBC: 0 % (ref 0.0–0.2)

## 2023-10-23 MED ORDER — ACETAMINOPHEN 325 MG PO TABS
650.0000 mg | ORAL_TABLET | Freq: Once | ORAL | Status: AC
  Administered 2023-10-23: 650 mg via ORAL
  Filled 2023-10-23: qty 2

## 2023-10-23 NOTE — Discharge Instructions (Signed)
 It is unclear what is causing your headache.  Your CT scan was reassuring but you may need an MRI if your symptoms persist.  Follow-up with your primary care provider.  For your finger, you can apply topical antibiotic ointment and soak it in warm and soapy water.  If you develop any new or worsening symptoms then return to the ER.

## 2023-10-23 NOTE — ED Triage Notes (Signed)
 Pt arriving with concern that he may have a piece of stone stuck in his right middle finger from work. Pt also concerned about a severe headache that he has had for almost three weeks. Rating finger pain 10/10, headache 7/10. Denies any vision changes.

## 2023-10-23 NOTE — ED Provider Notes (Signed)
  EMERGENCY DEPARTMENT AT [facility] Provider Note   CSN: [id number] Arrival date & time: 10/23/23  9083     Patient presents with: Finger Injury (Right middle finger) and Headache   Kevin Maxwell is a 73 y.o. male.   HPI 73 year old male presents with right middle finger swelling and concern for a possible retained foreign body.  He states several years ago he was working on a job and had a piece of stone going to his finger.  He thinks it might of broken off.  He has had intermittent infections in this finger and has had to have it lanced before.  Over the last 3 days he has noticed recurrent swelling and pain.  He stuck a pocket knife into the swelling and got a little bit of blood and pus.  He is worried that he might still have a retained piece of stone.  No numbness or weakness.  He is also wondering if he needs an update to his Tdap, though when asked he thinks he has had it in the last 10 years.  He also is complaining of an occipital headache for the past month or so.  It is a daily headache worst in the morning but pretty much constant.  He has been taking Aleve  without relief.  The headache gets better if he puts a lot of pressure at the base of his skull.  No fevers, vision changes, weakness or numbness in his extremities.  No dizziness.  Prior to Admission medications   Medication Sig Start Date End Date Taking? Authorizing Provider  acetaminophen  (TYLENOL ) 500 MG tablet Take 2 tablets (1,000 mg total) by mouth every 6 (six) hours as needed for moderate pain. 09/24/20   Nivia Colon, PA-C  albuterol  (PROVENTIL  HFA;VENTOLIN  HFA) 108 (90 Base) MCG/ACT inhaler Inhale 2 puffs into the lungs every 6 (six) hours as needed for wheezing or shortness of breath. 09/22/17   Fleming, Zelda W, NP  amoxicillin -clavulanate (AUGMENTIN ) 875-125 MG tablet Take 1 tablet by mouth every 12 (twelve) hours. 05/10/23   Hildegard Loge, PA-C  benzonatate  (TESSALON ) 100 MG capsule Take 1 capsule  (100 mg total) by mouth every 8 (eight) hours. 12/14/18   Landy Honora CROME, PA-C  cyclobenzaprine  (FLEXERIL ) 10 MG tablet Take 1 tablet (10 mg total) by mouth 3 (three) times daily as needed for muscle spasms. 09/22/17   Fleming, Zelda W, NP  diclofenac  sodium (VOLTAREN ) 1 % GEL Apply 4 g topically 4 (four) times daily. 09/24/20   Nivia Colon, PA-C  losartan  (COZAAR ) 25 MG tablet Take 1 tablet (25 mg total) by mouth daily. 11/19/20   Mayers, Cari S, PA-C    Allergies: Aspirin, Ibuprofen, and Norvasc  [amlodipine  besylate]    Review of Systems  Constitutional:  Negative for fever.  Eyes:  Negative for visual disturbance.  Musculoskeletal:  Positive for arthralgias.  Neurological:  Positive for headaches. Negative for dizziness, weakness and numbness.    Updated Vital Signs BP (!) 128/98 (BP Location: Right Arm)   Pulse 86   Temp 98.2 F (36.8 C) (Oral)   Resp 18   Ht 5' 9 (1.753 m)   Wt 72.1 kg   SpO2 100%   BMI 23.48 kg/m   Physical Exam Vitals and nursing note reviewed.  Constitutional:      General: He is not in acute distress.    Appearance: He is well-developed. He is not ill-appearing or diaphoretic.  HENT:     Head: Normocephalic and atraumatic.  Eyes:     Extraocular Movements: Extraocular movements intact.     Pupils: Pupils are equal, round, and reactive to light.  Cardiovascular:     Rate and Rhythm: Normal rate and regular rhythm.     Heart sounds: Normal heart sounds.  Pulmonary:     Effort: Pulmonary effort is normal.     Breath sounds: Normal breath sounds.  Abdominal:     Palpations: Abdomen is soft.     Tenderness: There is no abdominal tenderness.  Musculoskeletal:     Cervical back: Normal range of motion. No rigidity.     Comments: On the dorsal aspect of the right middle finger at the DIP joint there is some minimal firm swelling and a small wound (from his pocket knife). No fluctuance, warmth or cellulitis appreciated. Normal ROM of the finger.   Skin:    General: Skin is warm and dry.  Neurological:     Mental Status: He is alert.     Comments: CN 3-12 grossly intact. 5/5 strength in all 4 extremities. Grossly normal sensation. Normal finger to nose.      (all labs ordered are listed, but only abnormal results are displayed) Labs Reviewed  COMPREHENSIVE METABOLIC PANEL WITH GFR - Abnormal; Notable for the following components:      Result Value   Glucose, Bld 105 (*)    All other components within normal limits  CBC WITH DIFFERENTIAL/PLATELET    EKG: None  Radiology: CT Head Wo Contrast Result Date: 10/23/2023 CLINICAL DATA:  Provided history: Headache, increasing frequency or severity. Headache, new onset. EXAM: CT HEAD WITHOUT CONTRAST TECHNIQUE: Contiguous axial images were obtained from the base of the skull through the vertex without intravenous contrast. RADIATION DOSE REDUCTION: This exam was performed according to the departmental dose-optimization program which includes automated exposure control, adjustment of the mA and/or kV according to patient size and/or use of iterative reconstruction technique. COMPARISON:  Head CT 12/13/2013. FINDINGS: Brain: Mild generalized cerebral atrophy. Nonspecific mineralization within the bilateral basal ganglia. There is no acute intracranial hemorrhage. No demarcated cortical infarct. No extra-axial fluid collection. No evidence of an intracranial mass. No midline shift. Vascular: No hyperdense vessel.  Atherosclerotic calcifications. Skull: No calvarial fracture or aggressive osseous lesion. Sinuses/Orbits: No mass or acute finding within the imaged orbits. Mild-to-moderate mucosal thickening within the left maxillary sinus at the imaged levels. Mild mucosal thickening within the bilateral ethmoid and frontal sinuses. IMPRESSION: 1.  No evidence of an acute intracranial abnormality. 2. Paranasal sinus mucosal thickening at the imaged levels, as described. Electronically Signed   By: Rockey Childs D.O.   On: 10/23/2023 12:07   DG Finger Middle Right Result Date: 10/23/2023 CLINICAL DATA:  Possible foreign body in the right middle finger. EXAM: RIGHT MIDDLE FINGER 2+V COMPARISON:  None Available. FINDINGS: No acute fracture, dislocation or soft tissue foreign body identified. On the lateral view there is small corticated fragmentation along the dorsal aspect of the distal phalanx at the level of the DIP joint representing either old injury or unfused apophysis. No significant arthropathy. No bony lesions or destruction. IMPRESSION: No acute findings. No soft tissue foreign body identified. Small corticated fragmentation along the dorsal aspect of the distal phalanx at the level of the DIP joint representing either old injury or unfused apophysis. Electronically Signed   By: Marcey Moan M.D.   On: 10/23/2023 11:29     Procedures   Medications Ordered in the ED  acetaminophen  (TYLENOL ) tablet 650 mg (650  mg Oral Given 10/23/23 1220)                                    Medical Decision Making Amount and/or Complexity of Data Reviewed Labs: ordered.    Details: Normal WBC Radiology: ordered and independent interpretation performed.    Details: No head bleed.  No foreign body around finger.  Risk OTC drugs.   Labs are unremarkable.  Vital signs have been reassuring besides some mildly elevated diastolic blood pressures.  Otherwise, head CT is benign for his headache that is been going on for a month.  Discussed that his headache getting better he may need to follow-up with PCP and consider outpatient MRI.  From a finger perspective, no sign of a paronychia or obvious abscess.  Highly doubt deeper infection.  No foreign body seen on x-ray.  I have low suspicion given his injury occurred many years ago.  I think topical antibiotics is warranted given he seems to drain some from material but there is no fluctuance or anything to drain currently.  Tdap is up-to-date.  Will  discharge home with return precautions.     Final diagnoses:  Occipital headache  Pain of finger of right hand    ED Discharge Orders     None          Freddi Hamilton, MD 10/23/23 1446
